# Patient Record
Sex: Female | Born: 1975 | Race: White | Hispanic: No | Marital: Single | Smoking: Never smoker
Health system: Southern US, Community
[De-identification: ages and names within clinical notes are randomized; demographics above are authoritative.]

## PROBLEM LIST (undated history)

## (undated) DIAGNOSIS — B019 Varicella without complication: Secondary | ICD-10-CM

## (undated) DIAGNOSIS — F411 Generalized anxiety disorder: Secondary | ICD-10-CM

## (undated) DIAGNOSIS — E119 Type 2 diabetes mellitus without complications: Secondary | ICD-10-CM

## (undated) DIAGNOSIS — G43909 Migraine, unspecified, not intractable, without status migrainosus: Secondary | ICD-10-CM

## (undated) DIAGNOSIS — J45909 Unspecified asthma, uncomplicated: Secondary | ICD-10-CM

## (undated) HISTORY — DX: Generalized anxiety disorder: F41.1

## (undated) HISTORY — DX: Type 2 diabetes mellitus without complications: E11.9

## (undated) HISTORY — DX: Migraine, unspecified, not intractable, without status migrainosus: G43.909

## (undated) HISTORY — DX: Unspecified asthma, uncomplicated: J45.909

## (undated) HISTORY — DX: Varicella without complication: B01.9

## (undated) HISTORY — PX: WRIST SURGERY: SHX841

---

## 2006-12-01 HISTORY — PX: CHOLECYSTECTOMY: SHX55

## 2010-12-01 HISTORY — PX: LIPOMA RESECTION: SHX23

## 2019-12-15 DIAGNOSIS — F3342 Major depressive disorder, recurrent, in full remission: Secondary | ICD-10-CM | POA: Diagnosis not present

## 2019-12-15 DIAGNOSIS — F411 Generalized anxiety disorder: Secondary | ICD-10-CM | POA: Diagnosis not present

## 2019-12-15 DIAGNOSIS — R197 Diarrhea, unspecified: Secondary | ICD-10-CM | POA: Diagnosis not present

## 2019-12-15 DIAGNOSIS — J45998 Other asthma: Secondary | ICD-10-CM | POA: Diagnosis not present

## 2019-12-15 DIAGNOSIS — E119 Type 2 diabetes mellitus without complications: Secondary | ICD-10-CM | POA: Diagnosis not present

## 2019-12-21 DIAGNOSIS — Z87442 Personal history of urinary calculi: Secondary | ICD-10-CM | POA: Diagnosis not present

## 2019-12-21 DIAGNOSIS — Z79899 Other long term (current) drug therapy: Secondary | ICD-10-CM | POA: Diagnosis not present

## 2019-12-21 DIAGNOSIS — K5900 Constipation, unspecified: Secondary | ICD-10-CM | POA: Diagnosis not present

## 2019-12-21 DIAGNOSIS — Z7984 Long term (current) use of oral hypoglycemic drugs: Secondary | ICD-10-CM | POA: Diagnosis not present

## 2019-12-21 DIAGNOSIS — K649 Unspecified hemorrhoids: Secondary | ICD-10-CM | POA: Diagnosis not present

## 2019-12-21 DIAGNOSIS — K644 Residual hemorrhoidal skin tags: Secondary | ICD-10-CM | POA: Diagnosis not present

## 2019-12-21 DIAGNOSIS — F419 Anxiety disorder, unspecified: Secondary | ICD-10-CM | POA: Diagnosis not present

## 2019-12-21 DIAGNOSIS — R42 Dizziness and giddiness: Secondary | ICD-10-CM | POA: Diagnosis not present

## 2019-12-21 DIAGNOSIS — J45909 Unspecified asthma, uncomplicated: Secondary | ICD-10-CM | POA: Diagnosis not present

## 2019-12-21 DIAGNOSIS — Z9049 Acquired absence of other specified parts of digestive tract: Secondary | ICD-10-CM | POA: Diagnosis not present

## 2019-12-21 DIAGNOSIS — E119 Type 2 diabetes mellitus without complications: Secondary | ICD-10-CM | POA: Diagnosis not present

## 2019-12-29 DIAGNOSIS — Z6841 Body Mass Index (BMI) 40.0 and over, adult: Secondary | ICD-10-CM | POA: Diagnosis not present

## 2019-12-29 DIAGNOSIS — F410 Panic disorder [episodic paroxysmal anxiety] without agoraphobia: Secondary | ICD-10-CM | POA: Diagnosis not present

## 2019-12-29 DIAGNOSIS — K5901 Slow transit constipation: Secondary | ICD-10-CM | POA: Diagnosis not present

## 2019-12-29 DIAGNOSIS — F5104 Psychophysiologic insomnia: Secondary | ICD-10-CM | POA: Diagnosis not present

## 2020-01-28 DIAGNOSIS — R102 Pelvic and perineal pain: Secondary | ICD-10-CM | POA: Diagnosis not present

## 2020-01-28 DIAGNOSIS — Z79899 Other long term (current) drug therapy: Secondary | ICD-10-CM | POA: Diagnosis not present

## 2020-01-28 DIAGNOSIS — Z7982 Long term (current) use of aspirin: Secondary | ICD-10-CM | POA: Diagnosis not present

## 2020-01-28 DIAGNOSIS — Z7951 Long term (current) use of inhaled steroids: Secondary | ICD-10-CM | POA: Diagnosis not present

## 2020-01-28 DIAGNOSIS — Z888 Allergy status to other drugs, medicaments and biological substances status: Secondary | ICD-10-CM | POA: Diagnosis not present

## 2020-01-28 DIAGNOSIS — J45909 Unspecified asthma, uncomplicated: Secondary | ICD-10-CM | POA: Diagnosis not present

## 2020-01-28 DIAGNOSIS — Z87442 Personal history of urinary calculi: Secondary | ICD-10-CM | POA: Diagnosis not present

## 2020-01-28 DIAGNOSIS — Z7984 Long term (current) use of oral hypoglycemic drugs: Secondary | ICD-10-CM | POA: Diagnosis not present

## 2020-01-28 DIAGNOSIS — F419 Anxiety disorder, unspecified: Secondary | ICD-10-CM | POA: Diagnosis not present

## 2020-01-28 DIAGNOSIS — E119 Type 2 diabetes mellitus without complications: Secondary | ICD-10-CM | POA: Diagnosis not present

## 2020-02-01 DIAGNOSIS — N9089 Other specified noninflammatory disorders of vulva and perineum: Secondary | ICD-10-CM | POA: Diagnosis not present

## 2020-02-01 DIAGNOSIS — R110 Nausea: Secondary | ICD-10-CM | POA: Diagnosis not present

## 2020-02-01 DIAGNOSIS — N764 Abscess of vulva: Secondary | ICD-10-CM | POA: Diagnosis not present

## 2020-02-01 DIAGNOSIS — N94819 Vulvodynia, unspecified: Secondary | ICD-10-CM | POA: Diagnosis not present

## 2020-02-01 DIAGNOSIS — J45909 Unspecified asthma, uncomplicated: Secondary | ICD-10-CM | POA: Diagnosis not present

## 2020-02-01 DIAGNOSIS — Z79899 Other long term (current) drug therapy: Secondary | ICD-10-CM | POA: Diagnosis not present

## 2020-02-01 DIAGNOSIS — E119 Type 2 diabetes mellitus without complications: Secondary | ICD-10-CM | POA: Diagnosis not present

## 2020-02-01 DIAGNOSIS — F419 Anxiety disorder, unspecified: Secondary | ICD-10-CM | POA: Diagnosis not present

## 2020-02-01 DIAGNOSIS — R102 Pelvic and perineal pain: Secondary | ICD-10-CM | POA: Diagnosis not present

## 2020-02-03 DIAGNOSIS — N760 Acute vaginitis: Secondary | ICD-10-CM | POA: Diagnosis not present

## 2020-02-03 DIAGNOSIS — N9089 Other specified noninflammatory disorders of vulva and perineum: Secondary | ICD-10-CM | POA: Diagnosis not present

## 2020-02-03 DIAGNOSIS — N907 Vulvar cyst: Secondary | ICD-10-CM | POA: Diagnosis not present

## 2020-02-03 DIAGNOSIS — B9789 Other viral agents as the cause of diseases classified elsewhere: Secondary | ICD-10-CM | POA: Diagnosis not present

## 2020-03-08 LAB — HM DIABETES EYE EXAM

## 2020-03-30 DIAGNOSIS — F3342 Major depressive disorder, recurrent, in full remission: Secondary | ICD-10-CM | POA: Diagnosis not present

## 2020-03-30 DIAGNOSIS — F410 Panic disorder [episodic paroxysmal anxiety] without agoraphobia: Secondary | ICD-10-CM | POA: Diagnosis not present

## 2020-03-30 DIAGNOSIS — F411 Generalized anxiety disorder: Secondary | ICD-10-CM | POA: Diagnosis not present

## 2020-03-30 DIAGNOSIS — E119 Type 2 diabetes mellitus without complications: Secondary | ICD-10-CM | POA: Diagnosis not present

## 2020-05-17 DIAGNOSIS — F411 Generalized anxiety disorder: Secondary | ICD-10-CM | POA: Diagnosis not present

## 2020-07-20 ENCOUNTER — Telehealth: Payer: Self-pay | Admitting: Physician Assistant

## 2020-07-20 DIAGNOSIS — M545 Low back pain, unspecified: Secondary | ICD-10-CM

## 2020-07-20 NOTE — Progress Notes (Signed)
Hi Kaiyana,   I am sorry you are not feeling well.  Unfortunately, this is too complicated of a problem to be taken care of via an E-visit. You need a physical exam, lab work and maybe imaging.  I would feel more comfortable if you were seen face-to-face by a medical provider.   Based on what you shared with me, I feel your condition warrants further evaluation and I recommend that you be seen for a face to face office visit.   NOTE: If you entered your credit card information for this eVisit, you will not be charged. You may see a "hold" on your card for the $35 but that hold will drop off and you will not have a charge processed.   If you are having a true medical emergency please call 911.      For an urgent face to face visit, [facility] has five urgent care centers for your convenience:      NEW:  [facility] [place] [facility] [place] Get Driving Directions [phone] [address] [place], [place] [address] . 10 am - 6pm Monday - Friday    [facility]) Get Driving Directions [phone] [address] [place], [place] [address] . 10 am to 8 pm Monday-Friday . 12 pm to 8 pm [facility] Get Driving Directions [phone] [place], [place], [place] [address] . 8 am to 8 pm Monday-Friday . 9 am to 6 pm Saturday . 11 am to 6 pm Sunday     [facility] at [facility] Get Driving Directions  [phone] [address] [place], [place] [address] . 8 am to 8 pm Monday-Friday . 8 am to 4 pm [facility] [place] Get Driving Directions [phone] [address] [place], [place] [address] . 12 pm to 6 pm Monday-Friday      Your e-visit answers were reviewed by a board certified advanced clinical practitioner to complete your personal care plan.  Thank you for using  e-Visits.

## 2020-07-24 DIAGNOSIS — F411 Generalized anxiety disorder: Secondary | ICD-10-CM | POA: Diagnosis not present

## 2020-07-24 DIAGNOSIS — N200 Calculus of kidney: Secondary | ICD-10-CM | POA: Diagnosis not present

## 2020-07-24 DIAGNOSIS — Z131 Encounter for screening for diabetes mellitus: Secondary | ICD-10-CM | POA: Diagnosis not present

## 2020-07-24 DIAGNOSIS — E119 Type 2 diabetes mellitus without complications: Secondary | ICD-10-CM | POA: Diagnosis not present

## 2020-07-24 DIAGNOSIS — Z6841 Body Mass Index (BMI) 40.0 and over, adult: Secondary | ICD-10-CM | POA: Diagnosis not present

## 2020-10-01 ENCOUNTER — Ambulatory Visit (INDEPENDENT_AMBULATORY_CARE_PROVIDER_SITE_OTHER): Payer: BC Managed Care – PPO | Admitting: Primary Care

## 2020-10-01 ENCOUNTER — Other Ambulatory Visit: Payer: Self-pay

## 2020-10-01 ENCOUNTER — Encounter: Payer: Self-pay | Admitting: Primary Care

## 2020-10-01 VITALS — BP 136/82 | HR 87 | Temp 98.6°F | Ht 65.5 in | Wt 264.0 lb

## 2020-10-01 DIAGNOSIS — E119 Type 2 diabetes mellitus without complications: Secondary | ICD-10-CM

## 2020-10-01 DIAGNOSIS — Z1231 Encounter for screening mammogram for malignant neoplasm of breast: Secondary | ICD-10-CM

## 2020-10-01 DIAGNOSIS — F411 Generalized anxiety disorder: Secondary | ICD-10-CM | POA: Insufficient documentation

## 2020-10-01 DIAGNOSIS — G43709 Chronic migraine without aura, not intractable, without status migrainosus: Secondary | ICD-10-CM | POA: Diagnosis not present

## 2020-10-01 DIAGNOSIS — E1165 Type 2 diabetes mellitus with hyperglycemia: Secondary | ICD-10-CM | POA: Insufficient documentation

## 2020-10-01 DIAGNOSIS — Z23 Encounter for immunization: Secondary | ICD-10-CM | POA: Diagnosis not present

## 2020-10-01 DIAGNOSIS — J4520 Mild intermittent asthma, uncomplicated: Secondary | ICD-10-CM

## 2020-10-01 DIAGNOSIS — M5441 Lumbago with sciatica, right side: Secondary | ICD-10-CM

## 2020-10-01 DIAGNOSIS — J45909 Unspecified asthma, uncomplicated: Secondary | ICD-10-CM | POA: Insufficient documentation

## 2020-10-01 DIAGNOSIS — G43909 Migraine, unspecified, not intractable, without status migrainosus: Secondary | ICD-10-CM | POA: Insufficient documentation

## 2020-10-01 LAB — POCT GLYCOSYLATED HEMOGLOBIN (HGB A1C): Hemoglobin A1C: 5.6 % (ref 4.0–5.6)

## 2020-10-01 LAB — COMPREHENSIVE METABOLIC PANEL
ALT: 14 U/L (ref 0–35)
AST: 16 U/L (ref 0–37)
Albumin: 4.2 g/dL (ref 3.5–5.2)
Alkaline Phosphatase: 51 U/L (ref 39–117)
BUN: 11 mg/dL (ref 6–23)
CO2: 27 mEq/L (ref 19–32)
Calcium: 9 mg/dL (ref 8.4–10.5)
Chloride: 103 mEq/L (ref 96–112)
Creatinine, Ser: 0.74 mg/dL (ref 0.40–1.20)
GFR: 98.44 mL/min (ref >60.00)
Glucose, Bld: 120 mg/dL — ABNORMAL HIGH (ref 70–99)
Potassium: 4.5 mEq/L (ref 3.5–5.1)
Sodium: 137 mEq/L (ref 135–145)
Total Bilirubin: 0.4 mg/dL (ref 0.2–1.2)
Total Protein: 7 g/dL (ref 6.0–8.3)

## 2020-10-01 LAB — CBC
HCT: 42.3 % (ref 36.0–46.0)
Hemoglobin: 14 g/dL (ref 12.0–15.0)
MCHC: 33.2 g/dL (ref 30.0–36.0)
MCV: 83.9 fl (ref 78.0–100.0)
Platelets: 251 10*3/uL (ref 150.0–400.0)
RBC: 5.04 Mil/uL (ref 3.87–5.11)
RDW: 15 % (ref 11.5–15.5)
WBC: 10.1 10*3/uL (ref 4.0–10.5)

## 2020-10-01 LAB — LIPID PANEL
Cholesterol: 152 mg/dL (ref 0–200)
HDL: 38 mg/dL — ABNORMAL LOW (ref >39.00)
LDL Cholesterol: 81 mg/dL (ref 0–99)
NonHDL: 114
Total CHOL/HDL Ratio: 4
Triglycerides: 165 mg/dL — ABNORMAL HIGH (ref 0.0–149.0)
VLDL: 33 mg/dL (ref 0.0–40.0)

## 2020-10-01 MED ORDER — PREDNISONE 20 MG PO TABS: ORAL_TABLET | ORAL | 0 refills | Status: DC

## 2020-10-01 NOTE — Assessment & Plan Note (Signed)
Compliant to Metformin 1000 mg daily. A1C today of 5.6 which is a significant improvement from 6.1 in April 21.  Commended her on this improvement, encouraged to continue with weight loss.  Continue Metformin 1000 mg daily for now as she's trying to become pregnant. Consider dose reduction to 500 mg if she continues with weight loss.   Follow up in 6 months.

## 2020-10-01 NOTE — Assessment & Plan Note (Signed)
Using Advair every other day, infrequent use of Albuterol. Continue current regimen as she's doing well. No wheezing on exam.

## 2020-10-01 NOTE — Assessment & Plan Note (Signed)
Chronic and cyclical with menses. Did well on birth control but had to come off as she is trying for pregnancy.   Continue Fiorinal PRN.

## 2020-10-01 NOTE — Assessment & Plan Note (Signed)
Acute symptoms with sciatic nerve involvement. Discussed stretching, heat/ice. Rx for prednisone course provided.

## 2020-10-01 NOTE — Assessment & Plan Note (Signed)
Chronic for years, doing well on Zoloft 50 mg, infrequent use of lorazepam. Continue to monitor.

## 2020-10-01 NOTE — Patient Instructions (Addendum)
Stop by the lab prior to leaving today. I will notify you of your results once received.   Start prednisone. Take 2 tablets by mouth once daily for three days, then 1 tablet once daily for three days.  Continue to work on a healthy diet and regular exercise!  Please schedule a follow up appointment in 6 months for diabetes check.  It was a pleasure to meet you today! Please don't hesitate to call or message me with any questions. Welcome to [facility]!      Influenza (Flu) Vaccine (Inactivated or Recombinant): What You Need to Know 1. Why get vaccinated? Influenza vaccine can prevent influenza (flu). Flu is a contagious disease that spreads around the [place] every year, usually between October and May. Anyone can get the flu, but it is more dangerous for some people. Infants and young children, people 74 years of age and older, pregnant women, and people with certain health conditions or a weakened immune system are at greatest risk of flu complications. Pneumonia, bronchitis, sinus infections and ear infections are examples of flu-related complications. If you have a medical condition, such as heart disease, cancer or diabetes, flu can make it worse. Flu can cause fever and chills, sore throat, muscle aches, fatigue, cough, headache, and runny or stuffy nose. Some people may have vomiting and diarrhea, though this is more common in children than adults. Each year thousands of people in the [place] die from flu, and many more are hospitalized. Flu vaccine prevents millions of illnesses and flu-related visits to the doctor each year. 2. Influenza vaccine CDC recommends everyone 92 months of age and older get vaccinated every flu season. Children 6 months through 55 years of age may need 2 doses during a single flu season. Everyone else needs only 1 dose each flu season. It takes about 2 weeks for protection to develop after vaccination. There are many flu viruses, and they are always  changing. Each year a new flu vaccine is made to protect against three or four viruses that are likely to cause disease in the upcoming flu season. Even when the vaccine doesn't exactly match these viruses, it may still provide some protection. Influenza vaccine does not cause flu. Influenza vaccine may be given at the same time as other vaccines. 3. Talk with your health care provider Tell your vaccine provider if the person getting the vaccine:  Has had an allergic reaction after a previous dose of influenza vaccine, or has any severe, life-threatening allergies.  Has ever had Guillain-Barr Syndrome (also called GBS). In some cases, your health care provider may decide to postpone influenza vaccination to a future visit. People with minor illnesses, such as a cold, may be vaccinated. People who are moderately or severely ill should usually wait until they recover before getting influenza vaccine. Your health care provider can give you more information. 4. Risks of a vaccine reaction  Soreness, redness, and swelling where shot is given, fever, muscle aches, and headache can happen after influenza vaccine.  There may be a very small increased risk of Guillain-Barr Syndrome (GBS) after inactivated influenza vaccine (the flu shot). Young children who get the flu shot along with pneumococcal vaccine (PCV13), and/or DTaP vaccine at the same time might be slightly more likely to have a seizure caused by fever. Tell your health care provider if a child who is getting flu vaccine has ever had a seizure. People sometimes faint after medical procedures, including vaccination. Tell your provider if you feel  dizzy or have vision changes or ringing in the ears. As with any medicine, there is a very remote chance of a vaccine causing a severe allergic reaction, other serious injury, or death. 5. What if there is a serious problem? An allergic reaction could occur after the vaccinated person leaves the  clinic. If you see signs of a severe allergic reaction (hives, swelling of the face and throat, difficulty breathing, a fast heartbeat, dizziness, or weakness), call 9-1-1 and get the person to the nearest hospital. For other signs that concern you, call your health care provider. Adverse reactions should be reported to the Vaccine Adverse Event Reporting System (VAERS). Your health care provider will usually file this report, or you can do it yourself. Visit the VAERS website at www.vaers.SamedayNews.es or call 409-457-6031.VAERS is only for reporting reactions, and VAERS staff do not give medical advice. 6. The National Vaccine Injury Compensation Program The [employer] Vaccine Injury Compensation Program (VICP) is a federal program that was created to compensate people who may have been injured by certain vaccines. Visit the VICP website at GoldCloset.com.ee or call 806-093-0987 to learn about the program and about filing a claim. There is a time limit to file a claim for compensation. 7. How can I learn more?  Ask your healthcare provider.  Call your local or state health department.  Contact the Centers for Disease Control and Prevention (CDC): ? Call 769-349-8188 (1-800-CDC-INFO) or ? Visit CDC's https://gibson.com/ Vaccine Information Statement (Interim) Inactivated Influenza Vaccine (07/15/2018) This information is not intended to replace advice given to you by your health care provider. Make sure you discuss any questions you have with your health care provider. Document Revised: 03/08/2019 Document Reviewed: 07/19/2018 Elsevier Patient Education  [place].

## 2020-10-01 NOTE — Progress Notes (Signed)
Subjective:    Patient ID: Kelli Cox, female    DOB: 07/26/1976, 44 y.o.   MRN: [id number]  HPI  This visit occurred during the SARS-CoV-2 public health emergency.  Safety protocols were in place, including screening questions prior to the visit, additional usage of staff PPE, and extensive cleaning of exam room while observing appropriate contact time as indicated for disinfecting solutions.   Kelli Cox is a 44 year old female who presents today to establish care and discuss the problems mentioned below. Will obtain/review records.  1) GAD: Diagnosed around 2009 after the passing of her father. Currently managed on sertraline 50 mg. She is not taking trazodone 50 mg as she's able to sleep. She is managed on lorazepam 1 mg for which she uses infrequently, ten tablets will last for about three to six months.    2) Type 2 Diabetes: Diagnosed in 2013. Currently managed on Metformin 1000 mg once daily, was once taking twice daily but prior PCP reduced dose down due to A1C level. Her last A1C was 6.1 in April 2021. She was once managed on lisinopril for renal protection, no longer taking as she is trying to get pregnant. She's lost 80 pounds through diet and exercise.  3) Asthma: Currently managed on Advair 100-50 for which she uses every other day. She uses her albuterol inhaler infrequently during seasonal changes.  Azelastine-fluticasone nasal spray as needed. Overall she feels that her asthma is well managed.   4) Migraines: Chronic and monthly. Previously managed on OCP's and Depo Provera at one point which did well to managed cyclical migraines. Currently managed on butalbital-aspirin-caffeine tablets for which she uses just prior to menses due to cyclical migraines. She is trying to get pregnant so she had to come off of birth control.   Mostly her migraines are located behind her eyes with nausea and photophobia.   5) Chronic Back Pain: Chronic since 2018, but overall no significant  breakthrough symptoms. 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 Overall infrequent pain as she lost 80 pounds.   Recent symptoms began two days ago with right lower back and buttock pain with pain radiating down to her right knee with numbness/tinlging. She denies injury/trauma, loss bowel/bladder control.   BP Readings from Last 3 Encounters:  10/01/20 136/82     Review of Systems  Respiratory: Negative for shortness of breath.   Cardiovascular: Negative for chest pain.  Genitourinary:       Denies loss of bladder/bowel control  Musculoskeletal: Positive for back pain.  Neurological: Positive for numbness. Negative for weakness.       Monthly migraines  Psychiatric/Behavioral: The patient is not nervous/anxious.        Past Medical History:  Diagnosis Date   GAD (generalized anxiety disorder)    Migraines    Type 2 diabetes mellitus (HCC)      Social History   Socioeconomic History   Marital status: Not on file    Spouse name: Not on file   Number of children: Not on file   Years of education: Not on file   Highest education level: Not on file  Occupational History   Not on file  Tobacco Use   Smoking status: Never Smoker   Smokeless tobacco: Never Used  Substance and Sexual Activity   Alcohol use: Not on file    Comment: 1 drink every two weeks    Drug use: Never   Sexual activity: Yes  Other Topics Concern   Not on file  Social History Narrative   Not on file   Social Determinants of Health   Financial Resource Strain:    Difficulty of Paying Living Expenses: Not on file  Food Insecurity:    Worried About [place] in the Last Year: Not on file   Ran Out of Food in the Last Year: Not on file  Transportation Needs:    Lack of Transportation (Medical): Not on file   Lack of Transportation (Non-Medical): Not on file  Physical Activity:    Days of Exercise per Week: Not on file    Minutes of Exercise per Session: Not on file  Stress:    Feeling of Stress : Not on file  Social Connections:    Frequency of Communication with Friends and Family: Not on file   Frequency of Social Gatherings with Friends and Family: Not on file   Attends Religious Services: Not on file   Active Member of Clubs or Organizations: Not on file   Attends Archivist Meetings: Not on file   Marital Status: Not on file  Intimate Partner Violence:    Fear of Current or Ex-Partner: Not on file   Emotionally Abused: Not on file   Physically Abused: Not on file   Sexually Abused: Not on file    Past Surgical History:  Procedure Laterality Date   LIPOMA RESECTION  2012   WRIST SURGERY Left    2001 and 2007    History reviewed. No pertinent family history.  Allergies  Allergen Reactions   Clarithromycin Nausea And Vomiting    Current Outpatient Medications on File Prior to Visit  Medication Sig Dispense Refill   albuterol (PROVENTIL) (2.5 MG/3ML) 0.083% nebulizer solution Inhale into the lungs.     Ascorbic Acid (VITAMIN C) 500 MG CAPS Take by mouth.     Azelastine-Fluticasone 137-50 MCG/ACT SUSP Place into the nose.     butalbital-aspirin-caffeine (FIORINAL) 50-325-40 MG capsule Take by mouth.     cetirizine (ZYRTEC) 10 MG tablet Take by mouth.     Fluticasone-Salmeterol (ADVAIR) 100-50 MCG/DOSE AEPB Inhale into the lungs.     LORazepam (ATIVAN) 1 MG tablet Take 1 mg by mouth 2 (two) times daily.     naproxen (NAPROSYN) 500 MG tablet      sertraline (ZOLOFT) 50 MG tablet Take 50 mg by mouth daily.     vitamin E 180 MG (400 UNITS) capsule Take 400 Units by mouth daily.     No current facility-administered medications on file prior to visit.    BP 136/82    Pulse 87    Temp 98.6 F (37 C) (Temporal)    Ht 5' 5.5" (1.664 m)    Wt 264 lb (119.7 kg)    SpO2 98%    BMI 43.26 kg/m    Objective:   Physical Exam Cardiovascular:     Rate and Rhythm:  Normal rate and regular rhythm.  Pulmonary:     Effort: Pulmonary effort is normal.     Breath sounds: Normal breath sounds.  Musculoskeletal:     Cervical back: Neck supple.     Comments: 5/5 strength to bilateral lower extremities.  Positive right straight leg raise  Skin:    General: Skin is warm and dry.            Assessment & Plan:

## 2020-10-09 DIAGNOSIS — M5441 Lumbago with sciatica, right side: Secondary | ICD-10-CM

## 2020-10-09 MED ORDER — PREDNISONE 20 MG PO TABS: ORAL_TABLET | ORAL | 0 refills | Status: DC

## 2020-10-10 ENCOUNTER — Telehealth: Payer: BC Managed Care – PPO | Admitting: Family

## 2020-10-10 DIAGNOSIS — J069 Acute upper respiratory infection, unspecified: Secondary | ICD-10-CM

## 2020-10-10 MED ORDER — BENZONATATE 100 MG PO CAPS: 100.0000 mg | ORAL_CAPSULE | Freq: Three times a day (TID) | ORAL | 0 refills | Status: DC | PRN

## 2020-10-10 MED ORDER — CETIRIZINE HCL 10 MG PO TABS: 10.0000 mg | ORAL_TABLET | Freq: Every day | ORAL | 11 refills | Status: AC

## 2020-10-10 MED ORDER — FLUTICASONE PROPIONATE 50 MCG/ACT NA SUSP: 2.0000 | Freq: Every day | NASAL | 6 refills | Status: DC

## 2020-10-10 NOTE — Progress Notes (Signed)
We are sorry you are not feeling well.  Here is how we plan to help!  Based on what you have shared with me, it looks like you may have a viral upper respiratory infection.  Upper respiratory infections are caused by a large number of viruses; however, rhinovirus is the most common cause.   Symptoms vary from person to person, with common symptoms including sore throat, cough, fatigue or lack of energy and feeling of general discomfort.  A low-grade fever of up to 100.4 may present, but is often uncommon.  Symptoms vary however, and are closely related to a person's age or underlying illnesses.  The most common symptoms associated with an upper respiratory infection are nasal discharge or congestion, cough, sneezing, headache and pressure in the ears and face.  These symptoms usually persist for about 3 to 10 days, but can last up to 2 weeks.  It is important to know that upper respiratory infections do not cause serious illness or complications in most cases.    Upper respiratory infections can be transmitted from person to person, with the most common method of transmission being a person's hands.  The virus is able to live on the skin and can infect other persons for up to 2 hours after direct contact.  Also, these can be transmitted when someone coughs or sneezes; thus, it is important to cover the mouth to reduce this risk.  To keep the spread of the illness at [place], good hand hygiene is very important.  This is an infection that is most likely caused by a virus. There are no specific treatments other than to help you with the symptoms until the infection runs its course.  We are sorry you are not feeling well.  Here is how we plan to help!   For nasal congestion, you may use an oral decongestants such as Mucinex D or if you have glaucoma or high blood pressure use plain Mucinex.  Saline nasal spray or nasal drops can help and can safely be used as often as needed for congestion.  For your congestion,  I have prescribed Fluticasone nasal spray one spray in each nostril twice a day and zyrtec 10 mg.   If you do not have a history of heart disease, hypertension, diabetes or thyroid disease, prostate/bladder issues or glaucoma, you may also use Sudafed to treat nasal congestion.  It is highly recommended that you consult with a pharmacist or your primary care physician to ensure this medication is safe for you to take.     If you have a cough, you may use cough suppressants such as Delsym and Robitussin.  If you have glaucoma or high blood pressure, you can also use Coricidin HBP.   For cough I have prescribed for you A prescription cough medication called Tessalon Perles 100 mg. You may take 1-2 capsules every 8 hours as needed for cough  If you have a sore or scratchy throat, use a saltwater gargle-  to  teaspoon of salt dissolved in a 4-ounce to 8-ounce glass of warm water.  Gargle the solution for approximately 15-30 seconds and then spit.  It is important not to swallow the solution.  You can also use throat lozenges/cough drops and Chloraseptic spray to help with throat pain or discomfort.  Warm or cold liquids can also be helpful in relieving throat pain.  For headache, pain or general discomfort, you can use Ibuprofen or Tylenol as directed.   Some authorities believe that zinc  sprays or the use of Echinacea may shorten the course of your symptoms.   HOME CARE . Only take medications as instructed by your medical team. . Be sure to drink plenty of fluids. Water is fine as well as fruit juices, sodas and electrolyte beverages. You may want to stay away from caffeine or alcohol. If you are nauseated, try taking small sips of liquids. How do you know if you are getting enough fluid? Your urine should be a pale yellow or almost colorless. . Get rest. . Taking a steamy shower or using a humidifier may help nasal congestion and ease sore throat pain. You can place a towel over your head and  breathe in the steam from hot water coming from a faucet. . Using a saline nasal spray works much the same way. . Cough drops, hard candies and sore throat lozenges may ease your cough. . Avoid close contacts especially the very young and the elderly . Cover your mouth if you cough or sneeze . Always remember to wash your hands.   GET HELP RIGHT AWAY IF: . You develop worsening fever. . If your symptoms do not improve within 10 days . You develop yellow or green discharge from your nose over 3 days. . You have coughing fits . You develop a severe head ache or visual changes. . You develop shortness of breath, difficulty breathing or start having chest pain . Your symptoms persist after you have completed your treatment plan  MAKE SURE YOU   Understand these instructions.  Will watch your condition.  Will get help right away if you are not doing well or get worse.  Your e-visit answers were reviewed by a board certified advanced clinical practitioner to complete your personal care plan. Depending upon the condition, your plan could have included both over the counter or prescription medications. Please review your pharmacy choice. If there is a problem, you may call our nursing hot line at and have the prescription routed to another pharmacy. Your safety is important to [place]. If you have drug allergies check your prescription carefully.   You can use MyChart to ask questions about today's visit, request a non-urgent call back, or ask for a work or school excuse for 24 hours related to this e-Visit. If it has been greater than 24 hours you will need to follow up with your provider, or enter a new e-Visit to address those concerns. You will get an e-mail in the next two days asking about your experience.  I hope that your e-visit has been valuable and will speed your recovery. Thank you for using e-visits.    Approximately 5 minutes was spent documenting and reviewing patient's chart.

## 2020-10-17 ENCOUNTER — Telehealth (INDEPENDENT_AMBULATORY_CARE_PROVIDER_SITE_OTHER): Payer: BC Managed Care – PPO | Admitting: Primary Care

## 2020-10-17 ENCOUNTER — Other Ambulatory Visit: Payer: BC Managed Care – PPO

## 2020-10-17 ENCOUNTER — Other Ambulatory Visit: Payer: Self-pay

## 2020-10-17 VITALS — Temp 99.2°F | Ht 65.5 in | Wt 264.0 lb

## 2020-10-17 DIAGNOSIS — J019 Acute sinusitis, unspecified: Secondary | ICD-10-CM | POA: Diagnosis not present

## 2020-10-17 DIAGNOSIS — J4520 Mild intermittent asthma, uncomplicated: Secondary | ICD-10-CM | POA: Diagnosis not present

## 2020-10-17 HISTORY — DX: Acute sinusitis, unspecified: J01.90

## 2020-10-17 MED ORDER — ALBUTEROL SULFATE (2.5 MG/3ML) 0.083% IN NEBU: 2.5000 mg | INHALATION_SOLUTION | Freq: Four times a day (QID) | RESPIRATORY_TRACT | 0 refills | Status: DC | PRN

## 2020-10-17 MED ORDER — AMOXICILLIN-POT CLAVULANATE 875-125 MG PO TABS: 1.0000 | ORAL_TABLET | Freq: Two times a day (BID) | ORAL | 0 refills | Status: DC

## 2020-10-17 NOTE — Assessment & Plan Note (Signed)
  Over 2 weeks of sinusitis and now lower respiratory tract symptoms with low grade fevers, not improving with conservative treatment. She does appear ill but stable.  Given duration of symptoms coupled with presentation, will treat with Augmentin course. Refills provided for neubulizer solution for asthma, she has the nebulizer machine at home.  She will update once her Covid test returns.

## 2020-10-17 NOTE — Progress Notes (Signed)
Subjective:    Patient ID: Kelli Cox, female    DOB: [birth year], 44 y.o.   MRN: [id number]  HPI  Virtual Visit via Video Note  I connected with Kelli Cox on 10/17/20 at  7:20 AM EST by a video enabled telemedicine application and verified that I am speaking with the correct person using two identifiers.  Location: Patient: Home Provider: Office Participants: Patient and myself   I discussed the limitations of evaluation and management by telemedicine and the availability of in person appointments. The patient expressed understanding and agreed to proceed.  History of Present Illness:  Kelli Cox is a 44 year old female with a history of asthma, type 2 diabetes who presents today with a chief complaint of sinus pressure.   She was evaluated by telemedicine on 10/10/20 for symptoms of sore throat, cough, post nasal drip that began four days prior.  She was diagnosed with a viral URI and prescribed Zyrtec, Flonase, and Tessalon Perles. She's also been through two rounds of oral prednisone for sciatica.   Today she's feeling worse. Symptoms now include wheezing, increased cough with expulsion of green mucus, low grade fevers, fatigue. She has a history of sinusitis, typically occurs during seasonal changes, this feels similar. She denies known exposure to Covid-19, has received two Covid-19 vaccinations. She does plan on getting Covid tested today, but really doesn't believe she has Covid-19. Denies loss of taste/smell, diarrhea.    Observations/Objective:  Alert and oriented. Appears ill. No distress. Speaking in complete sentences. Dry, deep, congested cough during visit.  Assessment and Plan:  Over 2 weeks of sinusitis and now lower respiratory tract symptoms with low grade fevers, not improving with conservative treatment. She does appear ill but stable.  Given duration of symptoms coupled with presentation, will treat with Augmentin course. Refills provided for neubulizer  solution for asthma, she has the nebulizer machine at home.  She will update once her Covid test returns.   Follow Up Instructions:  Start Augmentin antibiotics for the infection Take 1 tablet by mouth twice daily for 10 days.  Use the nebulizer every 6 hours as needed for wheezing/shortness of breath.  Please update me once you receive your Covid-19 test.  Kelli Bossier, NP-C    I discussed the assessment and treatment plan with the patient. The patient was provided an opportunity to ask questions and all were answered. The patient agreed with the plan and demonstrated an understanding of the instructions.   The patient was advised to call back or seek an in-person evaluation if the symptoms worsen or if the condition fails to improve as anticipated.    Kelli Koch, NP    Review of Systems  Constitutional: Positive for fatigue and fever.  HENT: Positive for congestion, postnasal drip and sinus pressure.   Respiratory: Positive for shortness of breath and wheezing.   Allergic/Immunologic: Positive for environmental allergies.       Past Medical History:  Diagnosis Date   Asthma    Chicken pox    GAD (generalized anxiety disorder)    Migraines    Type 2 diabetes mellitus (HCC)      Social History   Socioeconomic History   Marital status: Single    Spouse name: Not on file   Number of children: Not on file   Years of education: Not on file   Highest education level: Master's degree (e.g., MA, MS, MEng, MEd, MSW, MBA)  Occupational History   Occupation: Government social research officer  Tobacco  Use   Smoking status: Never Smoker   Smokeless tobacco: Never Used  Substance and Sexual Activity   Alcohol use: Not on file    Comment: 1 drink every two weeks    Drug use: Never   Sexual activity: Yes    Partners: Male  Other Topics Concern   Not on file  Social History Narrative   Not on file   Social Determinants of Health   Financial Resource Strain:      Difficulty of Paying Living Expenses: Not on file  Food Insecurity:    Worried About Kelli Cox in the Last Year: Not on file   Ran Out of Food in the Last Year: Not on file  Transportation Needs:    Lack of Transportation (Medical): Not on file   Lack of Transportation (Non-Medical): Not on file  Physical Activity:    Days of Exercise per Week: Not on file   Minutes of Exercise per Session: Not on file  Stress:    Feeling of Stress : Not on file  Social Connections:    Frequency of Communication with Friends and Family: Not on file   Frequency of Social Gatherings with Friends and Family: Not on file   Attends Religious Services: Not on file   Active Member of Clubs or Organizations: Not on file   Attends Archivist Meetings: Not on file   Marital Status: Not on file  Intimate Partner Violence:    Fear of Current or Ex-Partner: Not on file   Emotionally Abused: Not on file   Physically Abused: Not on file   Sexually Abused: Not on file    Past Surgical History:  Procedure Laterality Date   CHOLECYSTECTOMY  2008   [place]  2012   WRIST SURGERY Left    2001 and 2007    Family History  Problem Relation Age of Onset   Arthritis Mother    Asthma Mother    Depression Mother    Hyperlipidemia Mother    Hypertension Mother    Kidney disease Mother    Cancer Father    Diabetes Father    Early death Father    Hyperlipidemia Father    Hypertension Father    Heart failure Father    Asthma Sister    Hypertension Sister    Miscarriages / Stillbirths Sister    Alcohol abuse Brother    Hypertension Brother    Osteoarthritis Maternal Grandmother    Asthma Maternal Grandmother    Cancer Maternal Grandmother    COPD Maternal Grandmother    Depression Maternal Grandmother    Early death Maternal Grandmother    Osteoarthritis Maternal Grandfather    Asthma Maternal Grandfather    COPD Maternal  Grandfather    Hypertension Maternal Grandfather    Hyperlipidemia Maternal Grandfather    Cancer Paternal Grandmother    Diabetes Paternal Grandmother    Diabetes Paternal Grandfather    Hyperlipidemia Paternal Grandfather    Heart failure Paternal Grandfather    Kidney disease Paternal Grandfather    Hyperlipidemia Other    Heart failure Other    Kidney disease Other     Allergies  Allergen Reactions   Clarithromycin Nausea And Vomiting    Current Outpatient Medications on File Prior to Visit  Medication Sig Dispense Refill   Ascorbic Acid (VITAMIN C) 500 MG CAPS Take by mouth.     Azelastine-Fluticasone 137-50 MCG/ACT SUSP Place into the nose.     benzonatate (TESSALON PERLES)  100 MG capsule Take 1 capsule (100 mg total) by mouth 3 (three) times daily as needed. 20 capsule 0   butalbital-aspirin-caffeine (FIORINAL) 50-325-40 MG capsule Take by mouth.     cetirizine (ZYRTEC) 10 MG tablet Take 1 tablet (10 mg total) by mouth daily. 30 tablet 11   fluticasone (FLONASE) 50 MCG/ACT nasal spray Place 2 sprays into both nostrils daily. 16 g 6   Fluticasone-Salmeterol (ADVAIR) 100-50 MCG/DOSE AEPB Inhale into the lungs.     LORazepam (ATIVAN) 1 MG tablet Take 1 mg by mouth 2 (two) times daily.     sertraline (ZOLOFT) 50 MG tablet Take 50 mg by mouth daily.     vitamin E 180 MG (400 UNITS) capsule Take 400 Units by mouth daily.     naproxen (NAPROSYN) 500 MG tablet      predniSONE (DELTASONE) 20 MG tablet Take 2 tablets by mouth once daily for four days, then 1 tablet once daily for four days. 12 tablet 0   No current facility-administered medications on file prior to visit.    Temp 99.2 F (37.3 C) (Oral)    Ht 5' 5.5" (1.664 m)    Wt 264 lb (119.7 kg)    BMI 43.26 kg/m    Objective:   Physical Exam Constitutional:      General: She is not in acute distress.    Appearance: She is ill-appearing.  Pulmonary:     Effort: Pulmonary effort is normal.      Comments: Dry, deep, congested cough during exam Neurological:     Mental Status: She is alert and oriented to person, place, and time.  Psychiatric:        Mood and Affect: Mood normal.            Assessment & Plan:

## 2020-10-17 NOTE — Patient Instructions (Signed)
Start Augmentin antibiotics for the infection Take 1 tablet by mouth twice daily for 10 days.  Use the nebulizer every 6 hours as needed for wheezing/shortness of breath.  Please update me once you receive your Covid-19 test.  Allie Bossier, NP-C

## 2020-10-17 NOTE — Assessment & Plan Note (Signed)
Refills provided for nebulizer solution to use PRN.

## 2020-10-29 ENCOUNTER — Encounter: Payer: Self-pay | Admitting: Primary Care

## 2020-10-29 ENCOUNTER — Other Ambulatory Visit: Payer: Self-pay

## 2020-10-29 DIAGNOSIS — J019 Acute sinusitis, unspecified: Secondary | ICD-10-CM

## 2020-10-29 NOTE — Telephone Encounter (Signed)
Please Advise

## 2020-10-30 NOTE — Telephone Encounter (Signed)
Please get update on patient first.  Routed to PCP as FYI.  Thanks.

## 2020-11-08 ENCOUNTER — Other Ambulatory Visit: Payer: Self-pay | Admitting: Primary Care

## 2020-11-08 DIAGNOSIS — Z1231 Encounter for screening mammogram for malignant neoplasm of breast: Secondary | ICD-10-CM

## 2020-11-09 ENCOUNTER — Ambulatory Visit
Admission: RE | Admit: 2020-11-09 | Discharge: 2020-11-09 | Disposition: A | Discharge status: Home / Self Care | Payer: BC Managed Care – PPO | Source: Ambulatory Visit | Attending: Family Medicine | Admitting: Family Medicine

## 2020-11-09 ENCOUNTER — Other Ambulatory Visit: Payer: Self-pay

## 2020-11-09 ENCOUNTER — Ambulatory Visit (INDEPENDENT_AMBULATORY_CARE_PROVIDER_SITE_OTHER): Payer: BC Managed Care – PPO

## 2020-11-09 VITALS — BP 125/93 | HR 98 | Temp 98.4°F | Resp 20 | Ht 65.0 in | Wt 260.0 lb

## 2020-11-09 DIAGNOSIS — M5441 Lumbago with sciatica, right side: Secondary | ICD-10-CM

## 2020-11-09 DIAGNOSIS — J9809 Other diseases of bronchus, not elsewhere classified: Secondary | ICD-10-CM | POA: Diagnosis not present

## 2020-11-09 DIAGNOSIS — R059 Cough, unspecified: Secondary | ICD-10-CM | POA: Diagnosis not present

## 2020-11-09 DIAGNOSIS — R062 Wheezing: Secondary | ICD-10-CM

## 2020-11-09 DIAGNOSIS — J209 Acute bronchitis, unspecified: Secondary | ICD-10-CM | POA: Diagnosis not present

## 2020-11-09 IMAGING — DX DG CHEST 2V
2 series · 2 of 2 positions shown · non-contrast
Comparison: None.

CLINICAL DATA: Cough and wheezing over the last month.

EXAM:
CHEST - 2 VIEW

[chest pa]
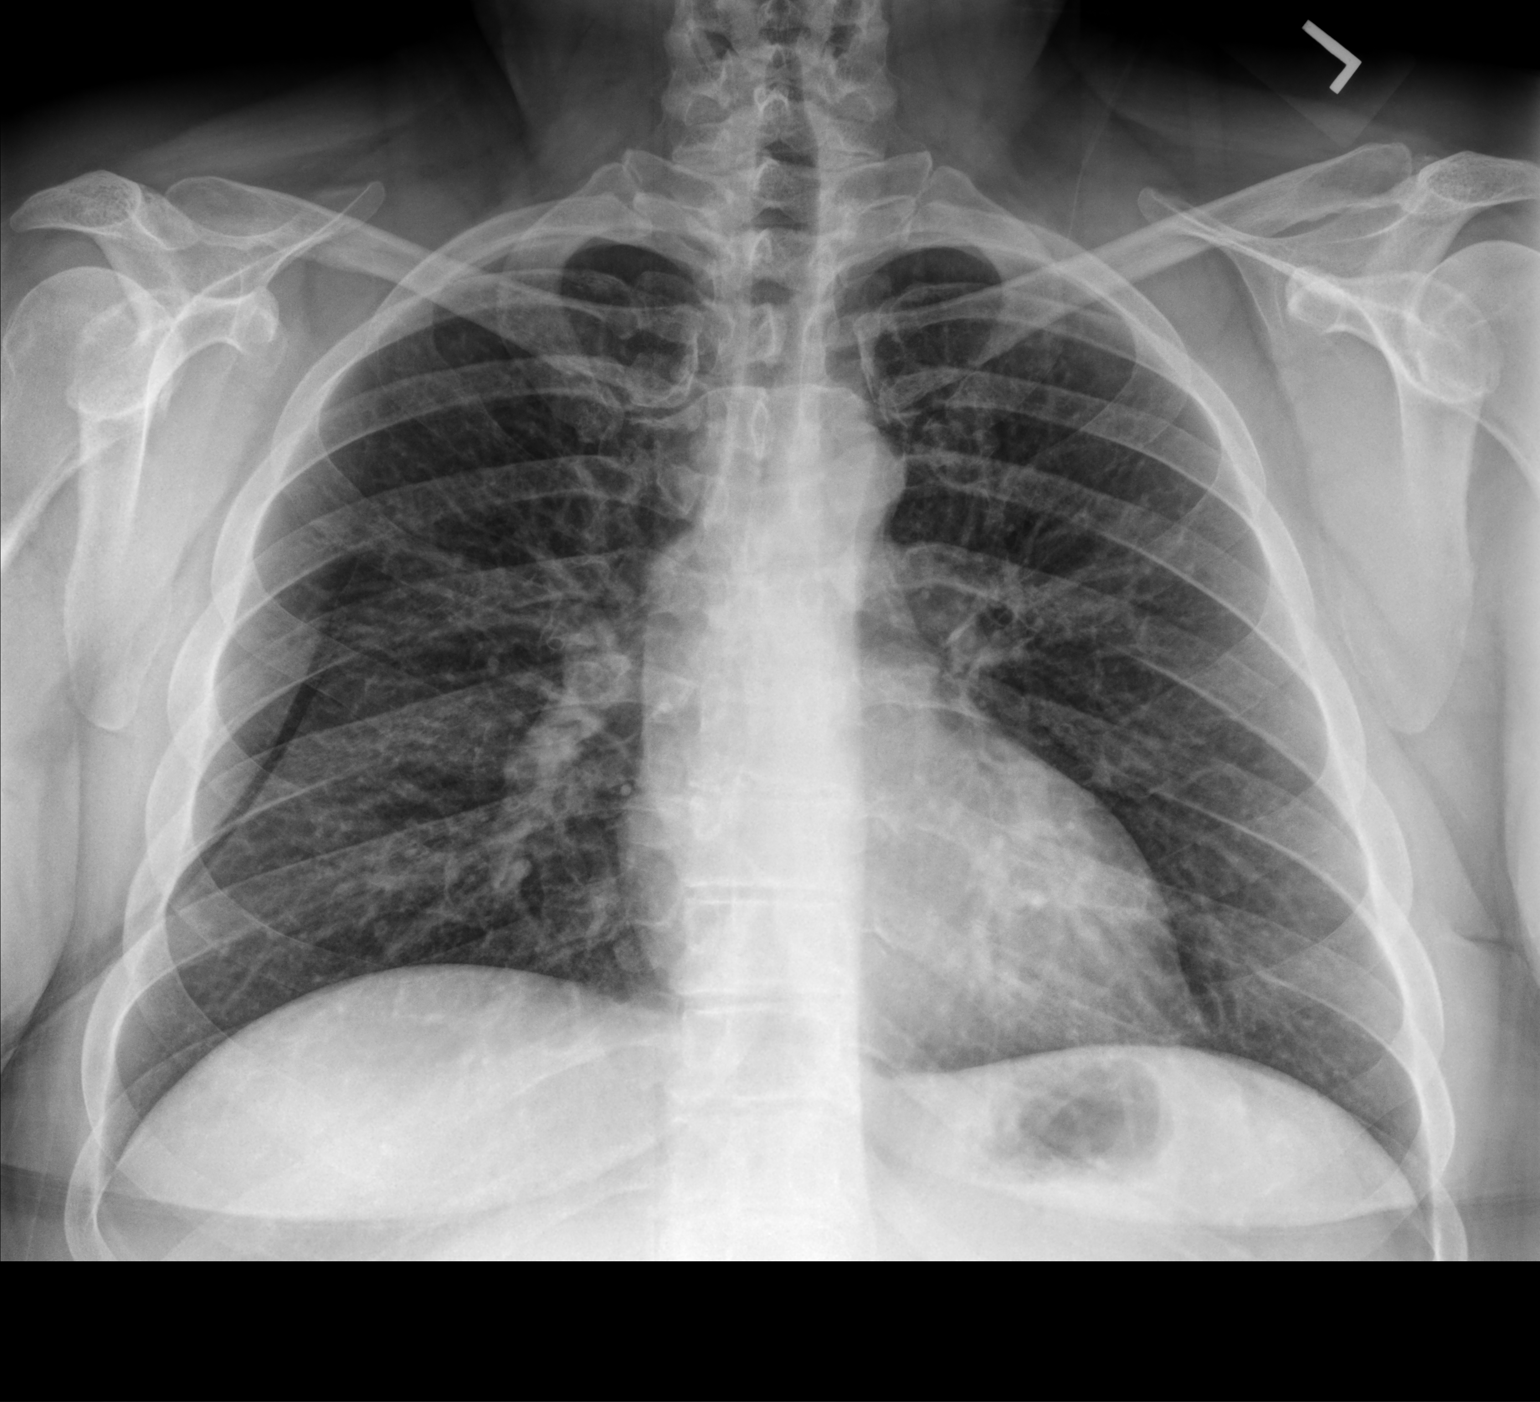

[chest lat]
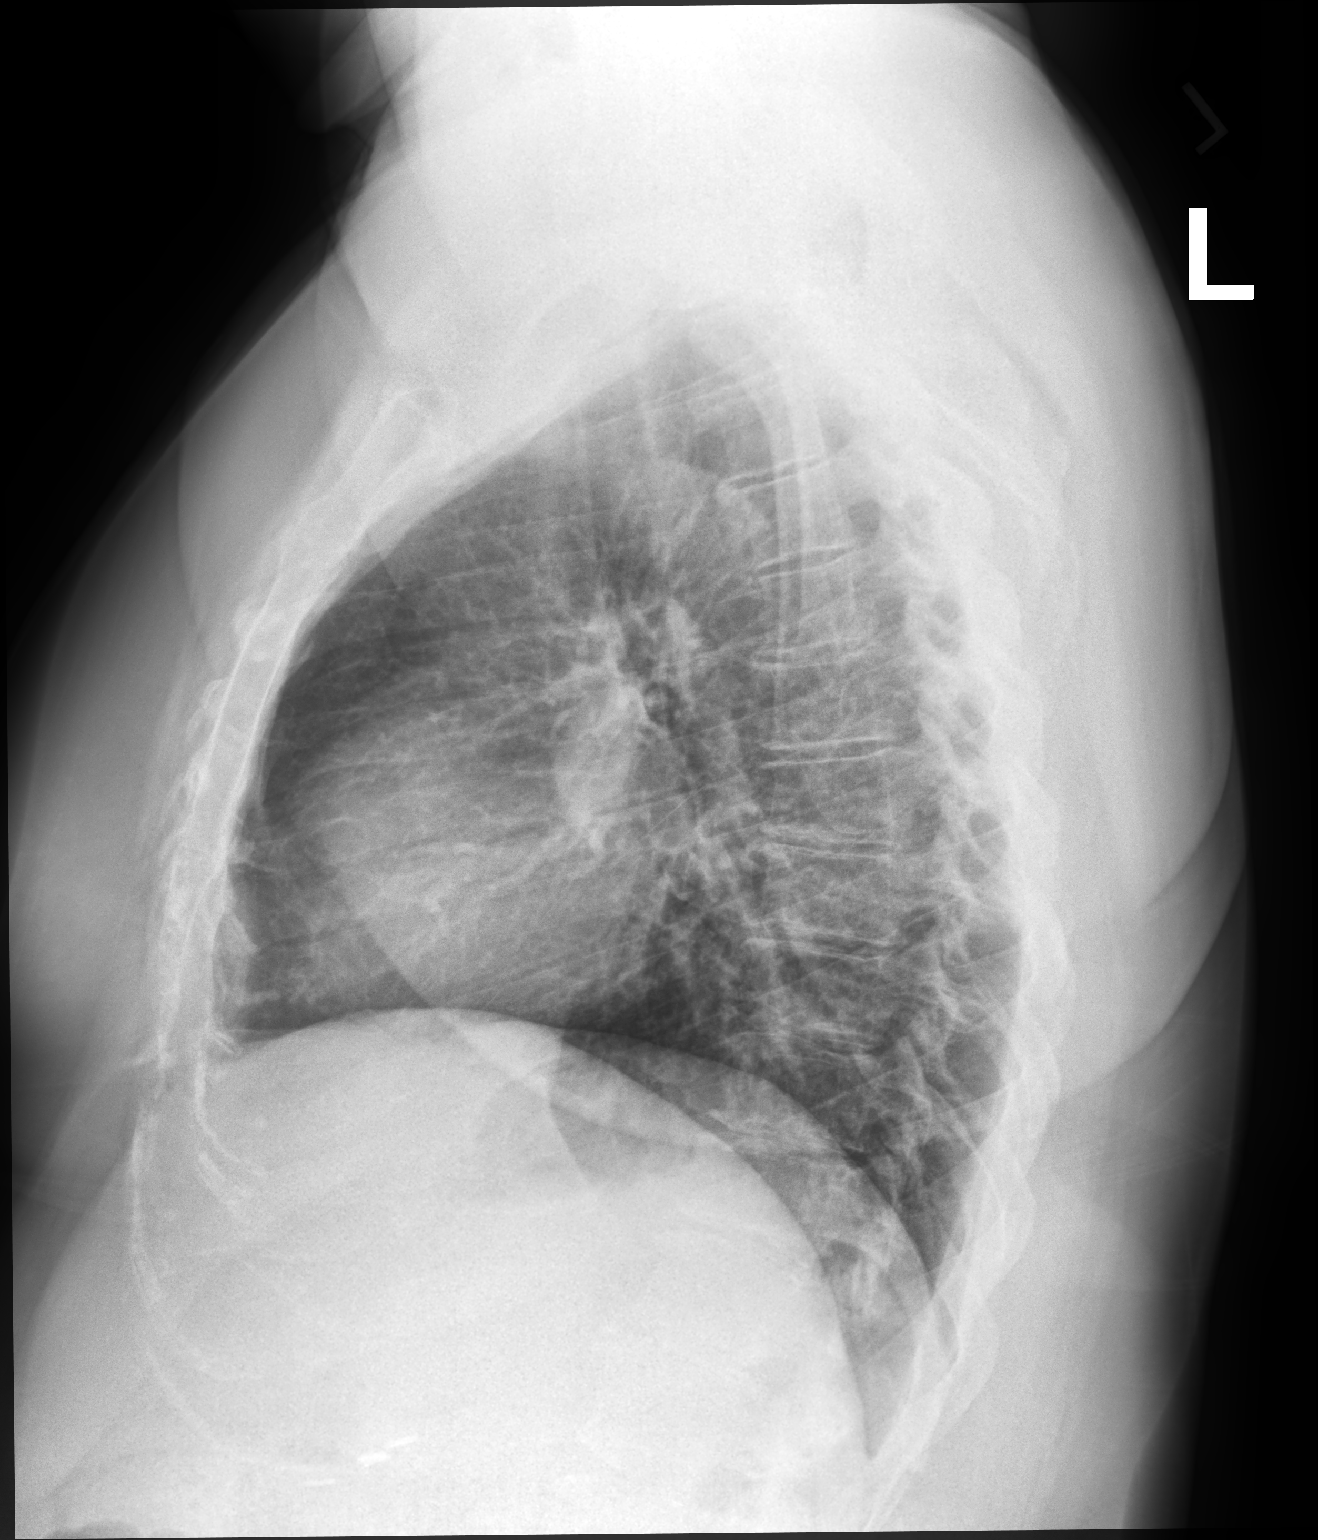

[2 of 2 positions shown; findings below may reference images not displayed]

FINDINGS: Heart and mediastinal shadows are normal. There is central bronchial
thickening consistent with bronchitis. No infiltrate, collapse or
effusion. No significant bone finding.
IMPRESSION: Bronchitis pattern. No consolidation or collapse.

## 2020-11-09 MED ORDER — BENZONATATE 100 MG PO CAPS: 200.0000 mg | ORAL_CAPSULE | Freq: Three times a day (TID) | ORAL | 0 refills | Status: DC

## 2020-11-09 MED ORDER — ALBUTEROL SULFATE HFA 108 (90 BASE) MCG/ACT IN AERS
1.0000 | INHALATION_SPRAY | Freq: Four times a day (QID) | RESPIRATORY_TRACT | 0 refills | Status: DC | PRN
Start: 2020-11-09 — End: 2021-02-22

## 2020-11-09 MED ORDER — PREDNISONE 10 MG PO TABS: 40.0000 mg | ORAL_TABLET | Freq: Every day | ORAL | 0 refills | Status: AC

## 2020-11-09 NOTE — ED Triage Notes (Signed)
Pt reports having a productive cough x1 month. sts the cough has gotten worse. Neg covid test 2 weeks ago.

## 2020-11-09 NOTE — Discharge Instructions (Addendum)
Treating you for bronchitis.  Medication as prescribed Follow up as needed for continued or worsening symptoms

## 2020-11-09 NOTE — ED Provider Notes (Signed)
[name]    CSN: [id number] Arrival date & time: 11/09/20  1154      History   Chief Complaint Chief Complaint  Patient presents with  . Appointment  . Cough    HPI Kelli Cox is a 44 y.o. female.   Patient is a 44 year old female past medical history of asthma.  She presents today for chronic cough and for the past month.  Was concerned due to the cough being more productive.  Has been taking over-the-counter cough medicine without any relief.  Takes Zyrtec daily.  Had 2 - Covid test 2 weeks ago.  She has had some mild shortness of breath.  Has been treated for sinusitis in the past month with Augmentin.  Did not see any improvement with this.  Has been taking Zyrtec, Flonase daily.  No fever, chills, body aches or night sweats.     Past Medical History:  Diagnosis Date  . Asthma   . Chicken pox   . GAD (generalized anxiety disorder)   . Migraines   . Type 2 diabetes mellitus [facility])     Patient Active Problem List   Diagnosis Date Noted  . Acute non-recurrent sinusitis 10/17/2020  . Diabetes mellitus without complication ([place]) 72/62/0355  . Migraines 10/01/2020  . Acute right-sided low back pain with right-sided sciatica 10/01/2020  . Asthma 10/01/2020  . GAD (generalized anxiety disorder) 10/01/2020    Past Surgical History:  Procedure Laterality Date  . CHOLECYSTECTOMY  2008  . LIPOMA RESECTION  2012  . WRIST SURGERY Left    2001 and 2007    OB History   No obstetric history on file.      Home Medications    Prior to Admission medications   Medication Sig Start Date End Date Taking? Authorizing Provider  albuterol (PROVENTIL) (2.5 MG/3ML) 0.083% nebulizer solution Take 3 mLs (2.5 mg total) by nebulization every 6 (six) hours as needed for wheezing or shortness of breath. 10/17/20   Pleas Koch, NP  albuterol (VENTOLIN HFA) 108 (90 Base) MCG/ACT inhaler Inhale 1-2 puffs into the lungs every 6 (six) hours as needed for wheezing or  shortness of breath. 11/09/20   Loura Halt A, NP  Ascorbic Acid (VITAMIN C) 500 MG CAPS Take by mouth.    [provider]  Azelastine-Fluticasone 137-50 MCG/ACT SUSP Place into the nose. 05/02/19   [provider]  benzonatate (TESSALON) 100 MG capsule Take 2 capsules (200 mg total) by mouth every 8 (eight) hours. 11/09/20   Orvan July, NP  butalbital-aspirin-caffeine Acquanetta Chain) 50-325-40 MG capsule Take by mouth. 12/15/18   [provider]  cetirizine (ZYRTEC) 10 MG tablet Take 1 tablet (10 mg total) by mouth daily. 10/10/20   Sharion Balloon, FNP  fluticasone (FLONASE) 50 MCG/ACT nasal spray Place 2 sprays into both nostrils daily. 10/10/20   Sharion Balloon, FNP  Fluticasone-Salmeterol (ADVAIR) 100-50 MCG/DOSE AEPB Inhale into the lungs. 06/21/20   [provider]  LORazepam (ATIVAN) 1 MG tablet Take 1 mg by mouth 2 (two) times daily. 08/13/20   [provider]  naproxen (NAPROSYN) 500 MG tablet     [provider]  predniSONE (DELTASONE) 10 MG tablet Take 4 tablets (40 mg total) by mouth daily for 5 days. 11/09/20 11/14/20  Loura Halt A, NP  sertraline (ZOLOFT) 50 MG tablet Take 50 mg by mouth daily. 09/11/20   [provider]  vitamin E 180 MG (400 UNITS) capsule Take 400 Units by mouth  daily.    [provider]    Family History Family History  Problem Relation Age of Onset  . Arthritis Mother   . Asthma Mother   . Depression Mother   . Hyperlipidemia Mother   . Hypertension Mother   . Kidney disease Mother   . Cancer Father   . Diabetes Father   . Early death Father   . Hyperlipidemia Father   . Hypertension Father   . Heart failure Father   . Asthma Sister   . Hypertension Sister   . Miscarriages / Stillbirths Sister   . Alcohol abuse Brother   . Hypertension Brother   . Osteoarthritis Maternal Grandmother   . Asthma Maternal Grandmother   . Cancer Maternal Grandmother   . COPD Maternal Grandmother    . Depression Maternal Grandmother   . Early death Maternal Grandmother   . Osteoarthritis Maternal Grandfather   . Asthma Maternal Grandfather   . COPD Maternal Grandfather   . Hypertension Maternal Grandfather   . Hyperlipidemia Maternal Grandfather   . Cancer Paternal Grandmother   . Diabetes Paternal Grandmother   . Diabetes Paternal Grandfather   . Hyperlipidemia Paternal Grandfather   . Heart failure Paternal Grandfather   . Kidney disease Paternal Grandfather   . Hyperlipidemia Other   . Heart failure Other   . Kidney disease Other     Social History Social History   Tobacco Use  . Smoking status: Never Smoker  . Smokeless tobacco: Never Used  Substance Use Topics  . Alcohol use: Yes    Comment: 1 drink every two weeks   . Drug use: Never     Allergies   Clarithromycin   Review of Systems Review of Systems   Physical Exam Triage Vital Signs ED Triage Vitals [11/09/20 1200]  Enc Vitals Group     BP (!) 125/93     Pulse Rate 98     Resp 20     Temp 98.4 F (36.9 C)     Temp Source Oral     SpO2 96 %     Weight 260 lb (117.9 kg)     Height 5\' 5"  (1.651 m)     Head Circumference      Peak Flow      Pain Score 0     Pain Loc      Pain Edu?      Excl. in [place]?    No data found.  Updated Vital Signs BP (!) 125/93   Pulse 98   Temp 98.4 F (36.9 C) (Oral)   Resp 20   Ht 5\' 5"  (1.651 m)   Wt 260 lb (117.9 kg)   LMP 10/29/2020   SpO2 96%   BMI 43.27 kg/m   Visual Acuity Right Eye Distance:   Left Eye Distance:   Bilateral Distance:    Right Eye Near:   Left Eye Near:    Bilateral Near:     Physical Exam Vitals and nursing note reviewed.  Constitutional:      General: She is not in acute distress.    Appearance: Normal appearance. She is not ill-appearing, toxic-appearing or diaphoretic.  HENT:     Head: Normocephalic.     Nose: Nose normal.  Eyes:     Conjunctiva/sclera: Conjunctivae normal.  Cardiovascular:     Rate and  Rhythm: Normal rate and regular rhythm.  Pulmonary:     Effort: Pulmonary effort is normal.     Breath sounds: Wheezing present.  Comments: Expiratory wheezing.  Musculoskeletal:        General: Normal range of motion.     Cervical back: Normal range of motion.  Skin:    General: Skin is warm and dry.     Findings: No rash.  Neurological:     Mental Status: She is alert.  Psychiatric:        Mood and Affect: Mood normal.      UC Treatments / Results  Labs (all labs ordered are listed, but only abnormal results are displayed) Labs Reviewed - No data to display  EKG   Radiology DG Chest 2 View  Result Date: 11/09/2020 CLINICAL DATA:  Cough and wheezing over the last month. EXAM: CHEST - 2 VIEW COMPARISON:  None. FINDINGS: Heart and mediastinal shadows are normal. There is central bronchial thickening consistent with bronchitis. No infiltrate, collapse or effusion. No significant bone finding. IMPRESSION: Bronchitis pattern. No consolidation or collapse. Electronically Signed   By: Nelson Chimes M.D.   On: 11/09/2020 12:33    Procedures Procedures (including critical care time)  Medications Ordered in UC Medications - No data to display  Initial Impression / Assessment and Plan / UC Course  I have reviewed the triage vital signs and the nursing notes.  Pertinent labs & imaging results that were available during my care of the patient were reviewed by me and considered in my medical decision making (see chart for details).     Acute bronchitis X-ray without any concerns Will treat with prednisone burst. Albuterol inhaler as needed for cough, wheezing or shortness of breath.  Tessalon Perles as needed for cough. Follow up as needed for continued or worsening symptoms  Final Clinical Impressions(s) / UC Diagnoses   Final diagnoses:  Acute bronchitis, unspecified organism     Discharge Instructions     Treating you for bronchitis.  Medication as  prescribed Follow up as needed for continued or worsening symptoms     ED Prescriptions    Medication Sig Dispense Auth. Provider   predniSONE (DELTASONE) 10 MG tablet Take 4 tablets (40 mg total) by mouth daily for 5 days. 20 tablet Nhi Butrum A, NP   albuterol (VENTOLIN HFA) 108 (90 Base) MCG/ACT inhaler Inhale 1-2 puffs into the lungs every 6 (six) hours as needed for wheezing or shortness of breath. 1 each Krishna Heuer A, NP   benzonatate (TESSALON) 100 MG capsule Take 2 capsules (200 mg total) by mouth every 8 (eight) hours. 45 capsule Avanelle Pixley A, NP     PDMP not reviewed this encounter.   Orvan July, NP 11/09/20 1248

## 2020-11-12 ENCOUNTER — Telehealth: Payer: BC Managed Care – PPO | Admitting: Primary Care

## 2020-12-04 ENCOUNTER — Other Ambulatory Visit: Payer: Self-pay

## 2020-12-04 ENCOUNTER — Ambulatory Visit
Admission: RE | Admit: 2020-12-04 | Discharge: 2020-12-04 | Disposition: A | Discharge status: Home / Self Care | Payer: BC Managed Care – PPO | Source: Ambulatory Visit

## 2020-12-04 DIAGNOSIS — Z1231 Encounter for screening mammogram for malignant neoplasm of breast: Secondary | ICD-10-CM

## 2020-12-04 IMAGING — MG DIGITAL SCREENING BILAT W/ TOMO W/ CAD
6 of 10 series · 6 of 30 positions shown · non-contrast
Comparison: None.

CLINICAL DATA: Screening.

EXAM:
DIGITAL SCREENING BILATERAL MAMMOGRAM WITH TOMO AND CAD

[R MLO synth-2D]
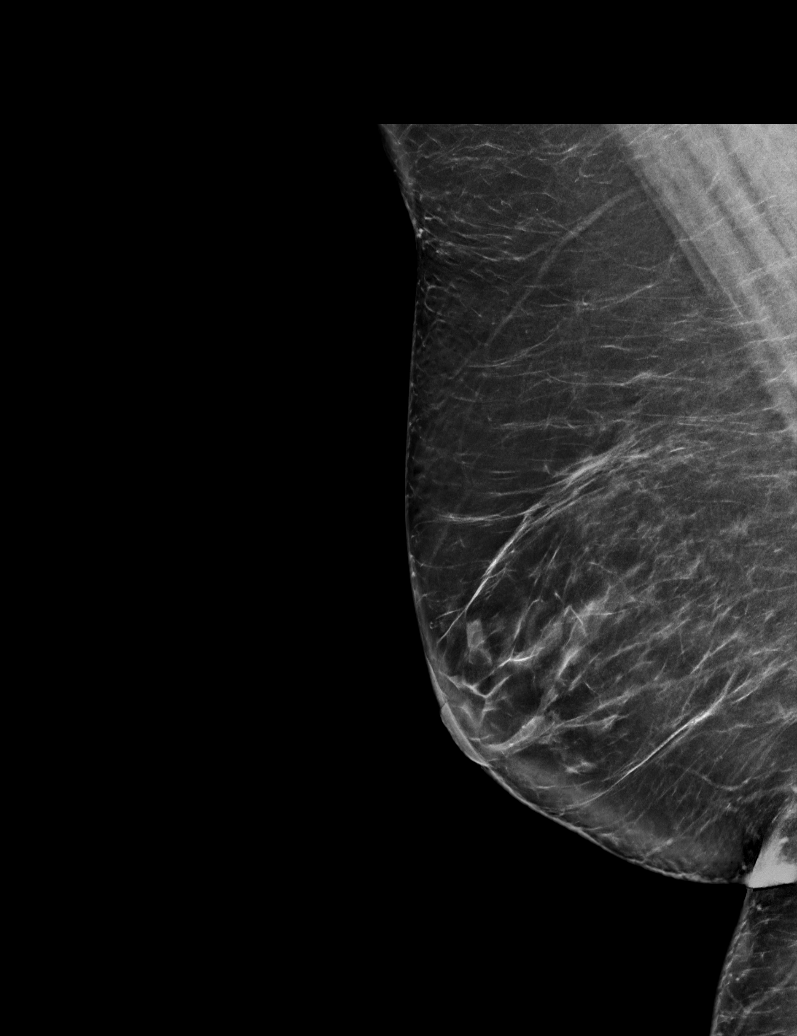

[L XCCM synth-2D]
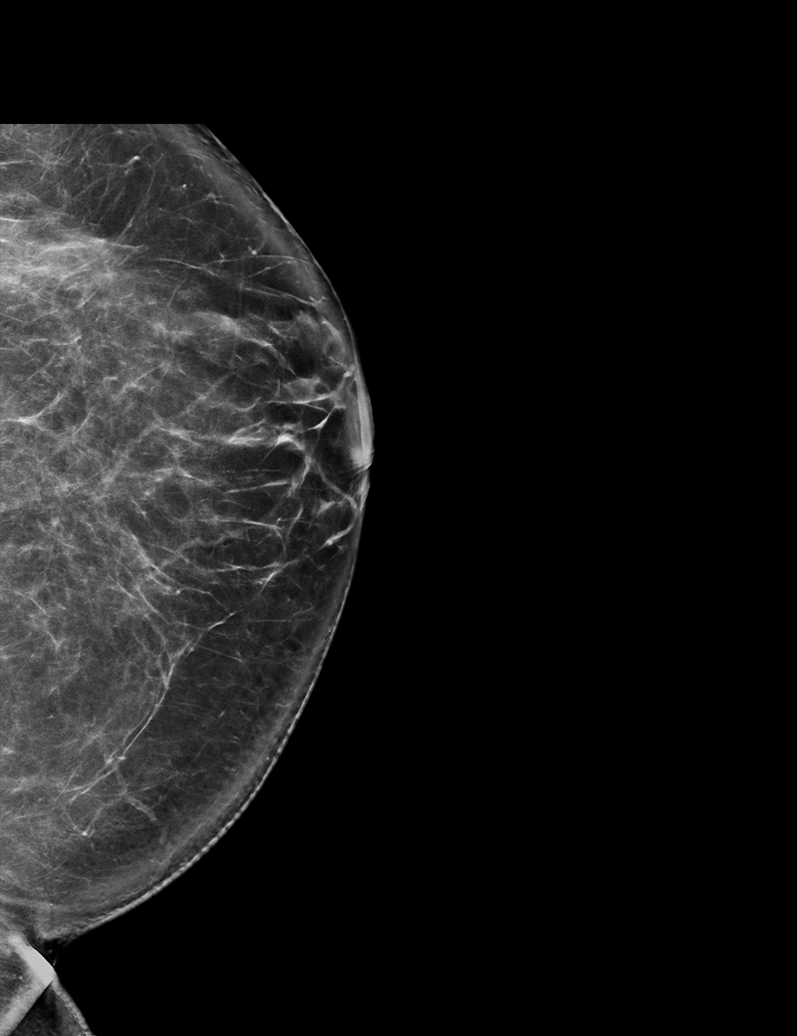

[L CC synth-2D]
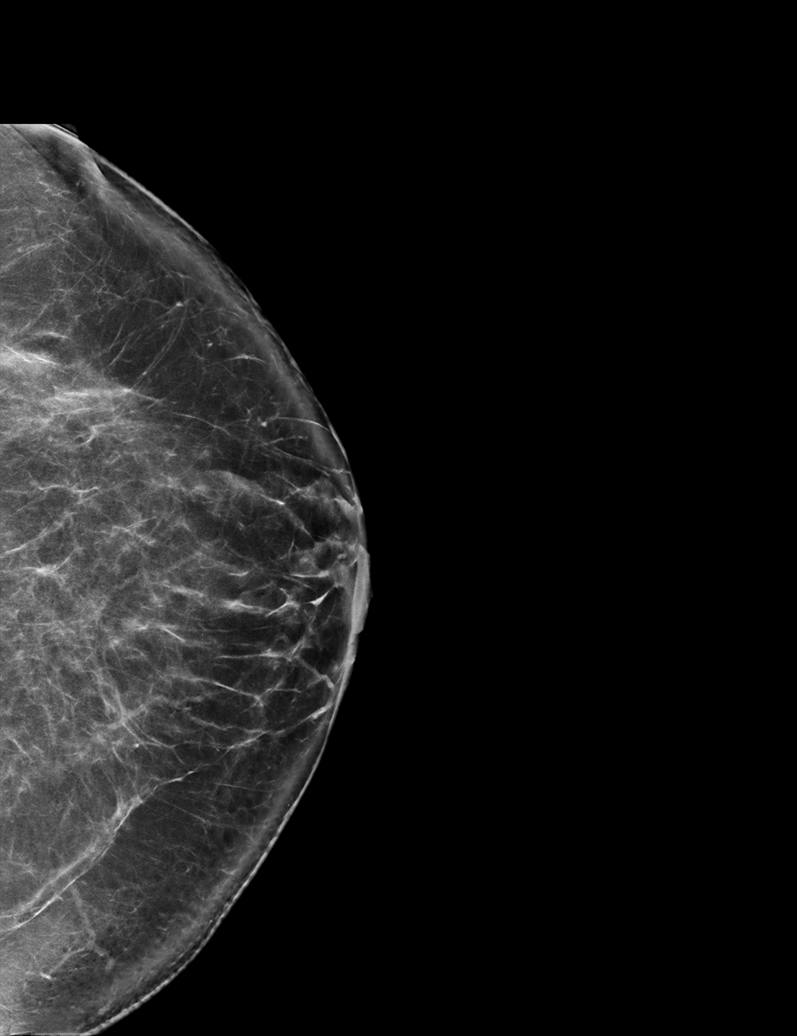

[R CC synth-2D]
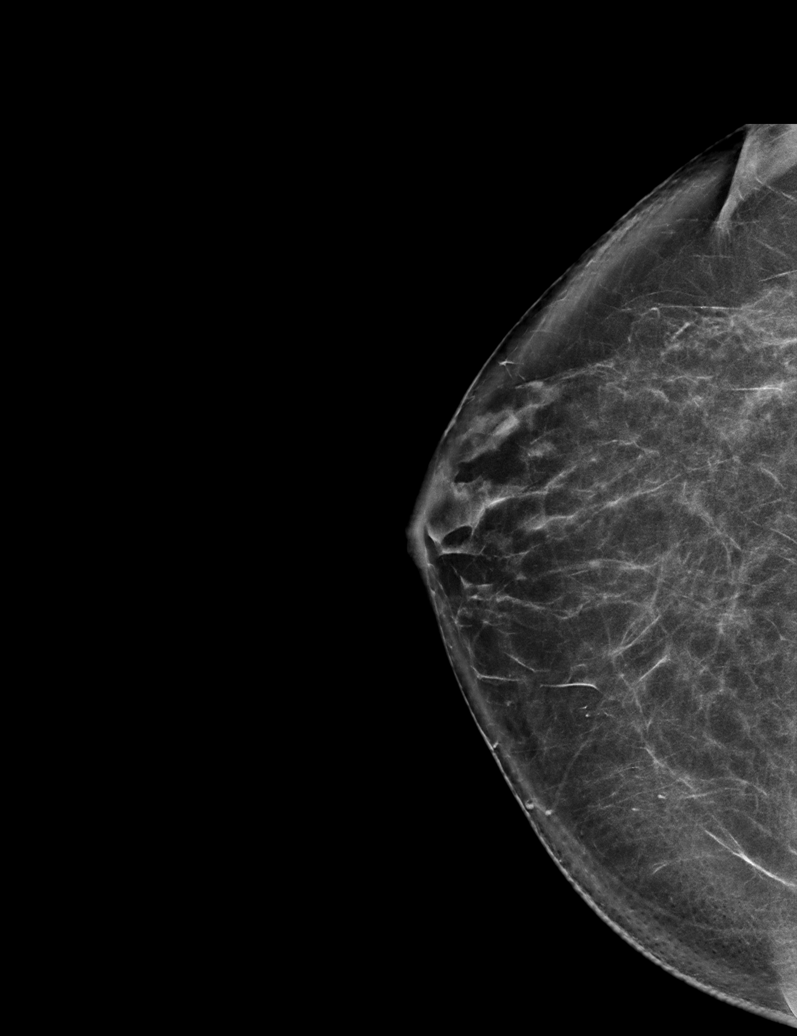

[L MLO synth-2D]
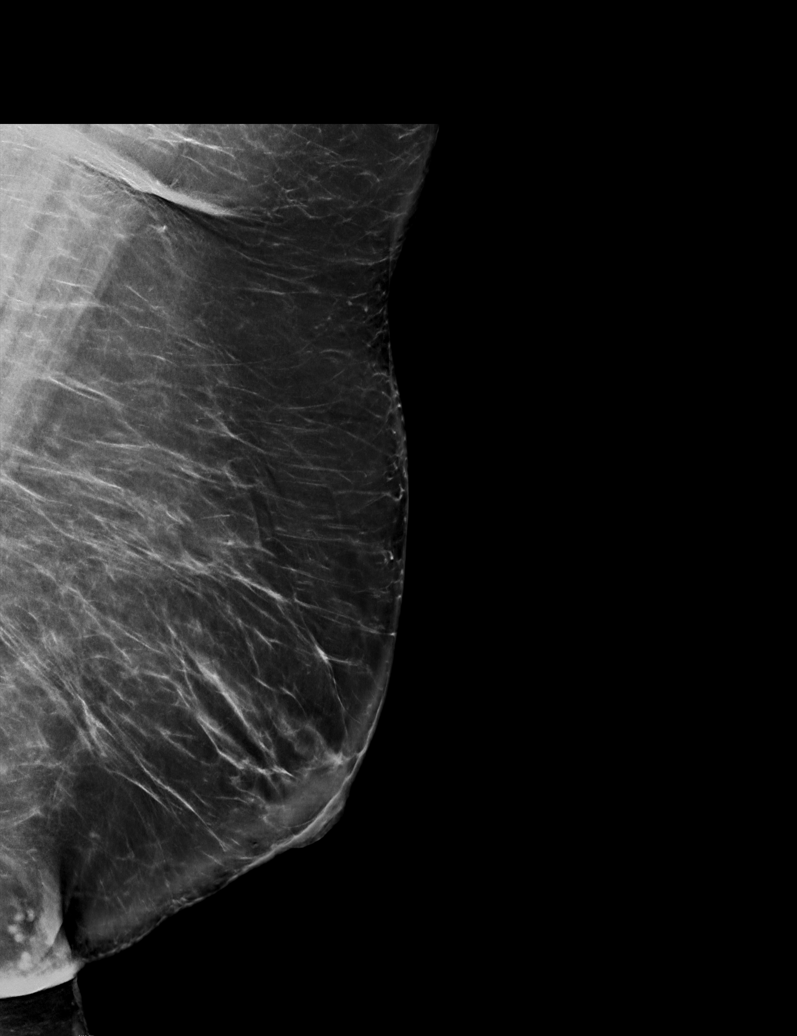

[L MLO tomo · tomo slice 48/95.0]
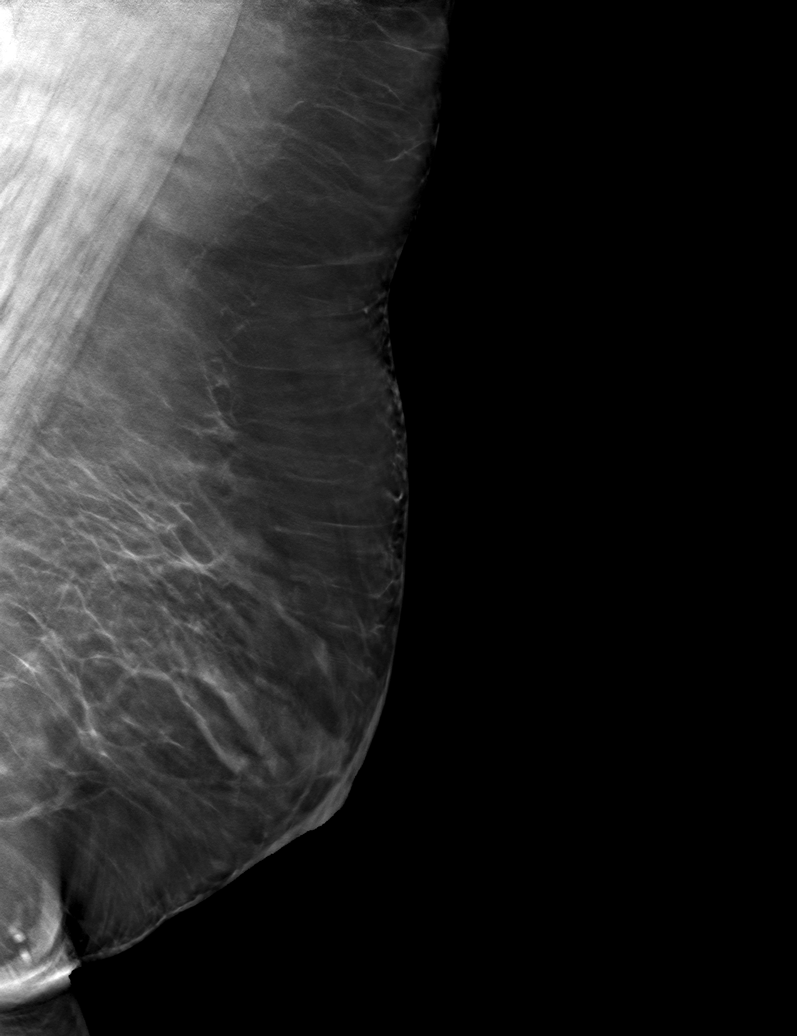

[6 of 30 positions shown; findings below may reference images not displayed]

ACR Breast Density Category b: There are scattered areas of
fibroglandular density.
FINDINGS: There are no findings suspicious for malignancy. Images were
processed with CAD.
IMPRESSION: No mammographic evidence of malignancy. A result letter of this
screening mammogram will be mailed directly to the patient.

RECOMMENDATION:
Screening mammogram in one year. (Code:[1T])

BI-RADS CATEGORY  1: Negative.

## 2020-12-07 ENCOUNTER — Telehealth: Payer: BC Managed Care – PPO | Admitting: Nurse Practitioner

## 2020-12-07 DIAGNOSIS — J4541 Moderate persistent asthma with (acute) exacerbation: Secondary | ICD-10-CM | POA: Diagnosis not present

## 2020-12-07 MED ORDER — PREDNISONE 20 MG PO TABS: ORAL_TABLET | ORAL | 0 refills | Status: DC

## 2020-12-07 MED ORDER — DOXYCYCLINE HYCLATE 100 MG PO TABS: 100.0000 mg | ORAL_TABLET | Freq: Two times a day (BID) | ORAL | 0 refills | Status: DC

## 2020-12-07 NOTE — Progress Notes (Signed)
We are sorry that you are not feeling well.  Here is how we plan to help!  Based on your presentation I believe you most likely have A cough due to bacteria.  When patients have a fever and a productive cough with a change in color or increased sputum production, we are concerned about bacterial bronchitis.  If left untreated it can progress to pneumonia.  If your symptoms do not improve with your treatment plan it is important that you contact your provider.   I have prescribed Doxycycline 100 mg twice a day for 7 days     In addition you may use A non-prescription cough medication called Mucinex DM: take 2 tablets every 12 hours.  Prednisone 20mg  2 tablets daily for 5 days.  From your responses in the eVisit questionnaire you describe inflammation in the upper respiratory tract which is causing a significant cough.  This is commonly called Bronchitis and has four common causes:    Allergies  Viral Infections  Acid Reflux  Bacterial Infection Allergies, viruses and acid reflux are treated by controlling symptoms or eliminating the cause. An example might be a cough caused by taking certain blood pressure medications. You stop the cough by changing the medication. Another example might be a cough caused by acid reflux. Controlling the reflux helps control the cough.  USE OF BRONCHODILATOR ("RESCUE") INHALERS: There is a risk from using your bronchodilator too frequently.  The risk is that over-reliance on a medication which only relaxes the muscles surrounding the breathing tubes can reduce the effectiveness of medications prescribed to reduce swelling and congestion of the tubes themselves.  Although you feel brief relief from the bronchodilator inhaler, your asthma may actually be worsening with the tubes becoming more swollen and filled with mucus.  This can delay other crucial treatments, such as oral steroid medications. If you need to use a bronchodilator inhaler daily, several times per  day, you should discuss this with your provider.  There are probably better treatments that could be used to keep your asthma under control.     HOME CARE . Only take medications as instructed by your medical team. . Complete the entire course of an antibiotic. . Drink plenty of fluids and get plenty of rest. . Avoid close contacts especially the very young and the elderly . Cover your mouth if you cough or cough into your sleeve. . Always remember to wash your hands . A steam or ultrasonic humidifier can help congestion.   GET HELP RIGHT AWAY IF: . You develop worsening fever. . You become short of breath . You cough up blood. . Your symptoms persist after you have completed your treatment plan MAKE SURE YOU   Understand these instructions.  Will watch your condition.  Will get help right away if you are not doing well or get worse.  Your e-visit answers were reviewed by a board certified advanced clinical practitioner to complete your personal care plan.  Depending on the condition, your plan could have included both over the counter or prescription medications. If there is a problem please reply  once you have received a response from your provider. Your safety is important to [place].  If you have drug allergies check your prescription carefully.    You can use MyChart to ask questions about today's visit, request a non-urgent call back, or ask for a work or school excuse for 24 hours related to this e-Visit. If it has been greater than 24 hours you  will need to follow up with your provider, or enter a new e-Visit to address those concerns. You will get an e-mail in the next two days asking about your experience.  I hope that your e-visit has been valuable and will speed your recovery. Thank you for using e-visits. 5-10 minutes spent reviewing and documenting in chart.

## 2020-12-12 NOTE — Telephone Encounter (Signed)
Office visit please. I'll work her in this week, just teams me.

## 2020-12-14 ENCOUNTER — Telehealth (INDEPENDENT_AMBULATORY_CARE_PROVIDER_SITE_OTHER): Payer: BC Managed Care – PPO | Admitting: Primary Care

## 2020-12-14 ENCOUNTER — Other Ambulatory Visit: Payer: Self-pay | Admitting: Primary Care

## 2020-12-14 DIAGNOSIS — F411 Generalized anxiety disorder: Secondary | ICD-10-CM

## 2020-12-14 MED ORDER — HYDROXYZINE HCL 10 MG PO TABS
10.0000 mg | ORAL_TABLET | Freq: Two times a day (BID) | ORAL | 0 refills | Status: DC | PRN
Start: 2020-12-14 — End: 2021-01-02

## 2020-12-14 NOTE — Progress Notes (Signed)
Subjective:    Patient ID: Kelli Cox, female    DOB: 1976/05/17, 45 y.o.   MRN: [id number]  HPI  Virtual Visit via Video Note  I connected with Kelli Cox on 12/14/20 at 10:00 AM EST by a video enabled telemedicine application and verified that I am speaking with the correct person using two identifiers.  Location: Patient: Home Provider: Office Participants: patient and myself   I discussed the limitations of evaluation and management by telemedicine and the availability of in person appointments. The patient expressed understanding and agreed to proceed.  History of Present Illness:  Kelli Cox is a 45 year old female with a history of migraines, asthma, type 2 diabetes, anxiety who presents today with reports of anxiety.   Currently managed on sertraline 50 mg and lorazepam 1 mg PRN. Over the last month she's experienced increased anxiety, is stressed with work, is afraid she will lose her job, has had "bronchitis" twice recently, is still recovering.   She is compliant to her sertraline 50 mg daily, believes this helps. She had a panic attack one week ago and took her last lorazepam tablet with relief. Her last panic attack was about one year ago, her last refill of lorazepam was in September 2021, has taken all 10 tablets of lorazepam for symptoms of panic/anxiety.  She has never tried anything else for acute anxiety in the past.    Observations/Objective:  Alert and oriented. Appears well, not sickly. No distress. Speaking in complete sentences.   Assessment and Plan:  Increased anxiety over the last month, seems situational with her occupation/illness.  She seems to be doing well overall on Zoloft 50 mg, discussed that we can increase the dose if needed in the future. Would rather her not be on benzos, is taking lorazepam twice monthly on average.  She agrees to try hydroxyzine, discussed dose and potential side effects.  Continue Zoloft 50 mg daily. Add  hydroxyzine PRN. She will update.   Follow Up Instructions:  Continue taking sertraline (Zoloft) 50 mg daily for anxiety.  You may take hydroxyzine 10 mg, 1 or 2 tablets up to twice daily if needed. This may cause drowsiness.  Please update me as discussed.  It was a pleasure to see you today! Allie Bossier, NP-C    I discussed the assessment and treatment plan with the patient. The patient was provided an opportunity to ask questions and all were answered. The patient agreed with the plan and demonstrated an understanding of the instructions.   The patient was advised to call back or seek an in-person evaluation if the symptoms worsen or if the condition fails to improve as anticipated.    Pleas Koch, NP    Review of Systems  Respiratory: Negative for shortness of breath.   Cardiovascular: Negative for chest pain.  Psychiatric/Behavioral: The patient is nervous/anxious.        See HPI       Past Medical History:  Diagnosis Date  . Asthma   . Chicken pox   . GAD (generalized anxiety disorder)   . Migraines   . Type 2 diabetes mellitus ([place])      Social History   Socioeconomic History  . Marital status: Single    Spouse name: Not on file  . Number of children: Not on file  . Years of education: Not on file  . Highest education level: Master's degree (e.g., MA, MS, MEng, MEd, MSW, MBA)  Occupational History  . Occupation: Project  Manager  Tobacco Use  . Smoking status: Never Smoker  . Smokeless tobacco: Never Used  Substance and Sexual Activity  . Alcohol use: Yes    Comment: 1 drink every two weeks   . Drug use: Never  . Sexual activity: Yes    Partners: Male  Other Topics Concern  . Not on file  Social History Narrative  . Not on file   Social Determinants of Health   Financial Resource Strain: Not on file  Food Insecurity: Not on file  Transportation Needs: Not on file  Physical Activity: Not on file  Stress: Not on file  Social  Connections: Not on file  Intimate Partner Violence: Not on file    Past Surgical History:  Procedure Laterality Date  . CHOLECYSTECTOMY  2008  . LIPOMA RESECTION  2012  . WRIST SURGERY Left    2001 and 2007    Family History  Problem Relation Age of Onset  . Arthritis Mother   . Asthma Mother   . Depression Mother   . Hyperlipidemia Mother   . Hypertension Mother   . Kidney disease Mother   . Cancer Father   . Diabetes Father   . Early death Father   . Hyperlipidemia Father   . Hypertension Father   . Heart failure Father   . Asthma Sister   . Hypertension Sister   . Miscarriages / Stillbirths Sister   . Alcohol abuse Brother   . Hypertension Brother   . Osteoarthritis Maternal Grandmother   . Asthma Maternal Grandmother   . Cancer Maternal Grandmother   . COPD Maternal Grandmother   . Depression Maternal Grandmother   . Early death Maternal Grandmother   . Osteoarthritis Maternal Grandfather   . Asthma Maternal Grandfather   . COPD Maternal Grandfather   . Hypertension Maternal Grandfather   . Hyperlipidemia Maternal Grandfather   . Cancer Paternal Grandmother   . Diabetes Paternal Grandmother   . Diabetes Paternal Grandfather   . Hyperlipidemia Paternal Grandfather   . Heart failure Paternal Grandfather   . Kidney disease Paternal Grandfather   . Hyperlipidemia Other   . Heart failure Other   . Kidney disease Other     Allergies  Allergen Reactions  . Clarithromycin Nausea And Vomiting    Current Outpatient Medications on File Prior to Visit  Medication Sig Dispense Refill  . albuterol (PROVENTIL) (2.5 MG/3ML) 0.083% nebulizer solution Take 3 mLs (2.5 mg total) by nebulization every 6 (six) hours as needed for wheezing or shortness of breath. 75 mL 0  . albuterol (VENTOLIN HFA) 108 (90 Base) MCG/ACT inhaler Inhale 1-2 puffs into the lungs every 6 (six) hours as needed for wheezing or shortness of breath. 1 each 0  . Ascorbic Acid (VITAMIN C) 500 MG  CAPS Take by mouth.    . Azelastine-Fluticasone 137-50 MCG/ACT SUSP Place into the nose.    . butalbital-aspirin-caffeine (FIORINAL) 50-325-40 MG capsule Take by mouth.    . cetirizine (ZYRTEC) 10 MG tablet Take 1 tablet (10 mg total) by mouth daily. 30 tablet 11  . doxycycline (VIBRA-TABS) 100 MG tablet Take 1 tablet (100 mg total) by mouth 2 (two) times daily. 1 po bid 14 tablet 0  . fluticasone (FLONASE) 50 MCG/ACT nasal spray Place 2 sprays into both nostrils daily. 16 g 6  . Fluticasone-Salmeterol (ADVAIR) 100-50 MCG/DOSE AEPB Inhale into the lungs.    Marland Kitchen LORazepam (ATIVAN) 1 MG tablet Take 1 mg by mouth 2 (two) times daily.    Marland Kitchen  sertraline (ZOLOFT) 50 MG tablet Take 50 mg by mouth daily.    . vitamin E 180 MG (400 UNITS) capsule Take 400 Units by mouth daily.     No current facility-administered medications on file prior to visit.    Temp 98.6 F (37 C) (Temporal)   Ht 5\' 5"  (1.651 m)   Wt 261 lb (118.4 kg)   LMP 10/29/2020   BMI 43.43 kg/m    Objective:   Physical Exam Constitutional:      General: She is not in acute distress. Pulmonary:     Effort: Pulmonary effort is normal.  Neurological:     Mental Status: She is alert and oriented to person, place, and time.  Psychiatric:        Mood and Affect: Mood normal.            Assessment & Plan:

## 2020-12-14 NOTE — Patient Instructions (Signed)
Continue taking sertraline (Zoloft) 50 mg daily for anxiety.  You may take hydroxyzine 10 mg, 1 or 2 tablets up to twice daily if needed. This may cause drowsiness.  Please update me as discussed.  It was a pleasure to see you today! Allie Bossier, NP-C

## 2020-12-14 NOTE — Assessment & Plan Note (Signed)
Increased anxiety over the last month, seems situational with her occupation/illness.  She seems to be doing well overall on Zoloft 50 mg, discussed that we can increase the dose if needed in the future. Would rather her not be on benzos, is taking lorazepam twice monthly on average.  She agrees to try hydroxyzine, discussed dose and potential side effects.  Continue Zoloft 50 mg daily. Add hydroxyzine PRN. She will update

## 2020-12-16 NOTE — Telephone Encounter (Signed)
On 1/14 you gave 30 day. Ok to send in or hold off until patient follow up with you?

## 2020-12-18 NOTE — Telephone Encounter (Signed)
No, 30 day supply for now until she updates me. This is a new medication for her.

## 2020-12-19 DIAGNOSIS — F411 Generalized anxiety disorder: Secondary | ICD-10-CM

## 2021-01-02 MED ORDER — HYDROXYZINE HCL 10 MG PO TABS: 10.0000 mg | ORAL_TABLET | Freq: Two times a day (BID) | ORAL | 0 refills | Status: DC | PRN

## 2021-01-02 MED ORDER — SERTRALINE HCL 100 MG PO TABS: 100.0000 mg | ORAL_TABLET | Freq: Every day | ORAL | 1 refills | Status: DC

## 2021-01-11 ENCOUNTER — Other Ambulatory Visit: Payer: Self-pay | Admitting: Primary Care

## 2021-01-11 DIAGNOSIS — F411 Generalized anxiety disorder: Secondary | ICD-10-CM

## 2021-01-11 NOTE — Telephone Encounter (Signed)
Refill request Hydroxyzine Last refill 01/02/21 #60 Last office visit 12/14/20 video

## 2021-01-22 ENCOUNTER — Ambulatory Visit: Payer: BC Managed Care – PPO | Admitting: Primary Care

## 2021-01-23 ENCOUNTER — Encounter: Payer: Self-pay | Admitting: Primary Care

## 2021-01-23 ENCOUNTER — Other Ambulatory Visit: Payer: Self-pay

## 2021-01-23 ENCOUNTER — Telehealth: Payer: Self-pay | Admitting: Primary Care

## 2021-01-23 ENCOUNTER — Ambulatory Visit (INDEPENDENT_AMBULATORY_CARE_PROVIDER_SITE_OTHER): Payer: BC Managed Care – PPO | Admitting: Primary Care

## 2021-01-23 ENCOUNTER — Ambulatory Visit (INDEPENDENT_AMBULATORY_CARE_PROVIDER_SITE_OTHER)
Admission: RE | Admit: 2021-01-23 | Discharge: 2021-01-23 | Disposition: A | Discharge status: Home / Self Care | Payer: BC Managed Care – PPO | Source: Ambulatory Visit | Attending: Primary Care | Admitting: Primary Care

## 2021-01-23 VITALS — BP 110/82 | HR 94 | Temp 98.6°F | Ht 65.0 in | Wt 271.0 lb

## 2021-01-23 DIAGNOSIS — R1010 Upper abdominal pain, unspecified: Secondary | ICD-10-CM

## 2021-01-23 DIAGNOSIS — R197 Diarrhea, unspecified: Secondary | ICD-10-CM

## 2021-01-23 DIAGNOSIS — R109 Unspecified abdominal pain: Secondary | ICD-10-CM | POA: Diagnosis not present

## 2021-01-23 HISTORY — DX: Upper abdominal pain, unspecified: R10.10

## 2021-01-23 LAB — CBC WITH DIFFERENTIAL/PLATELET
Basophils Absolute: 0.1 10*3/uL (ref 0.0–0.1)
Basophils Relative: 1 % (ref 0.0–3.0)
Eosinophils Absolute: 0.3 10*3/uL (ref 0.0–0.7)
Eosinophils Relative: 3.7 % (ref 0.0–5.0)
HCT: 42.8 % (ref 36.0–46.0)
Hemoglobin: 14.1 g/dL (ref 12.0–15.0)
Lymphocytes Relative: 27.6 % (ref 12.0–46.0)
Lymphs Abs: 2.4 10*3/uL (ref 0.7–4.0)
MCHC: 33 g/dL (ref 30.0–36.0)
MCV: 83.8 fl (ref 78.0–100.0)
Monocytes Absolute: 0.4 10*3/uL (ref 0.1–1.0)
Monocytes Relative: 4.6 % (ref 3.0–12.0)
Neutro Abs: 5.4 10*3/uL (ref 1.4–7.7)
Neutrophils Relative %: 63.1 % (ref 43.0–77.0)
Platelets: 226 10*3/uL (ref 150.0–400.0)
RBC: 5.11 Mil/uL (ref 3.87–5.11)
RDW: 14.7 % (ref 11.5–15.5)
WBC: 8.6 10*3/uL (ref 4.0–10.5)

## 2021-01-23 LAB — COMPREHENSIVE METABOLIC PANEL
ALT: 21 U/L (ref 0–35)
AST: 15 U/L (ref 0–37)
Albumin: 4 g/dL (ref 3.5–5.2)
Alkaline Phosphatase: 69 U/L (ref 39–117)
BUN: 8 mg/dL (ref 6–23)
CO2: 28 mEq/L (ref 19–32)
Calcium: 8.9 mg/dL (ref 8.4–10.5)
Chloride: 100 mEq/L (ref 96–112)
Creatinine, Ser: 0.68 mg/dL (ref 0.40–1.20)
GFR: 105.73 mL/min (ref >60.00)
Glucose, Bld: 309 mg/dL — ABNORMAL HIGH (ref 70–99)
Potassium: 4.9 mEq/L (ref 3.5–5.1)
Sodium: 134 mEq/L — ABNORMAL LOW (ref 135–145)
Total Bilirubin: 0.5 mg/dL (ref 0.2–1.2)
Total Protein: 6.8 g/dL (ref 6.0–8.3)

## 2021-01-23 LAB — LIPASE: Lipase: 13 U/L (ref 11.0–59.0)

## 2021-01-23 LAB — H. PYLORI ANTIBODY, IGG: H Pylori IgG: NEGATIVE

## 2021-01-23 IMAGING — DX DG ABDOMEN 2V
4 series · 4 of 4 positions shown · non-contrast
Comparison: None.

CLINICAL DATA: Left upper quadrant abdominal pain.

EXAM:
ABDOMEN - 2 VIEW

[abdomen erect (1 of 2)]
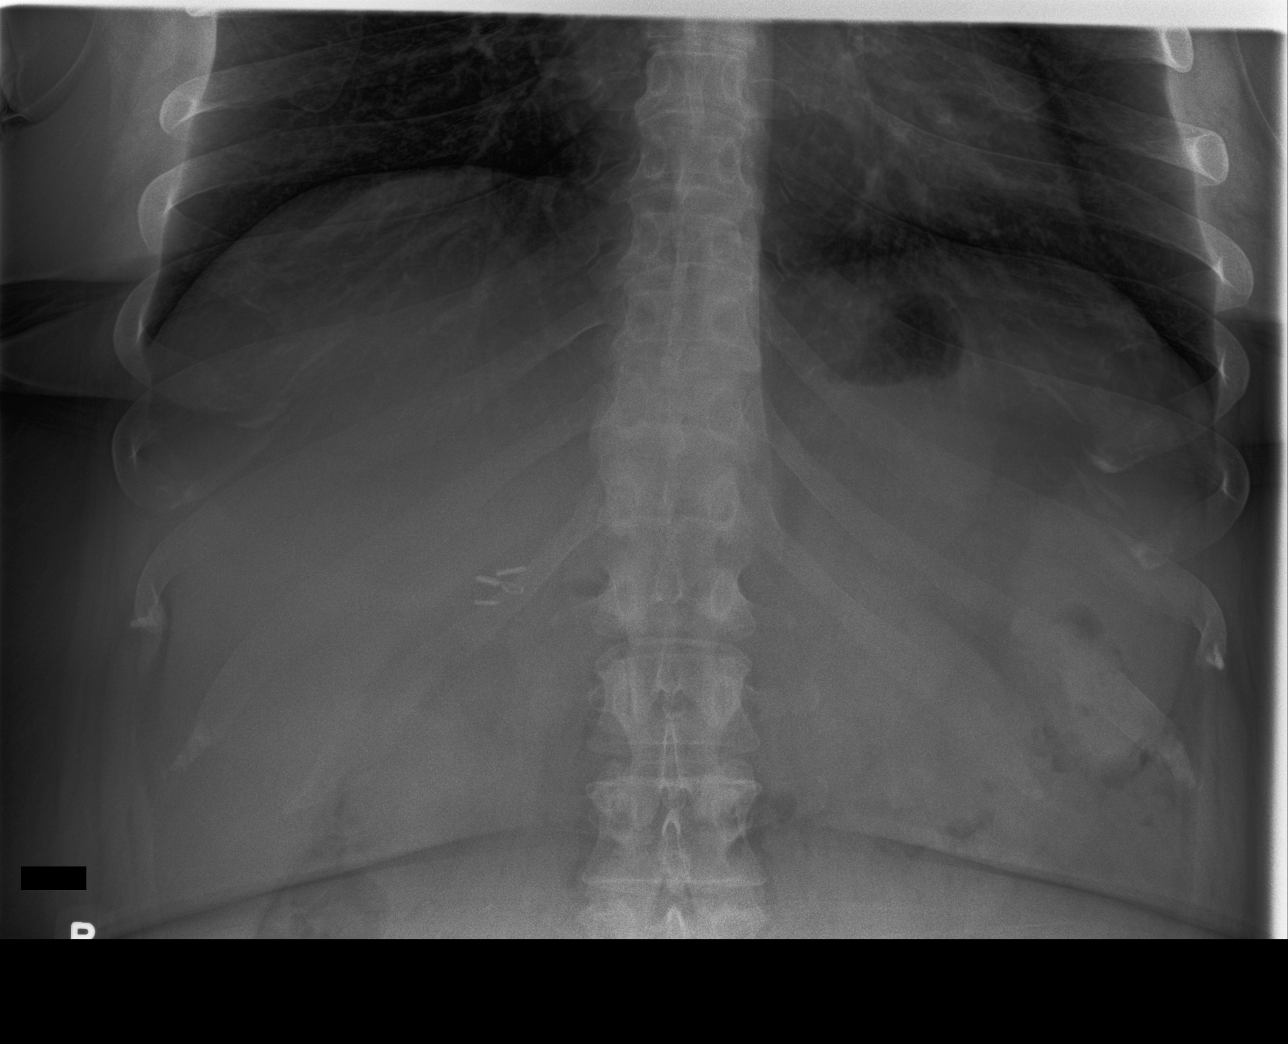

[abdomen supine (1 of 2)]
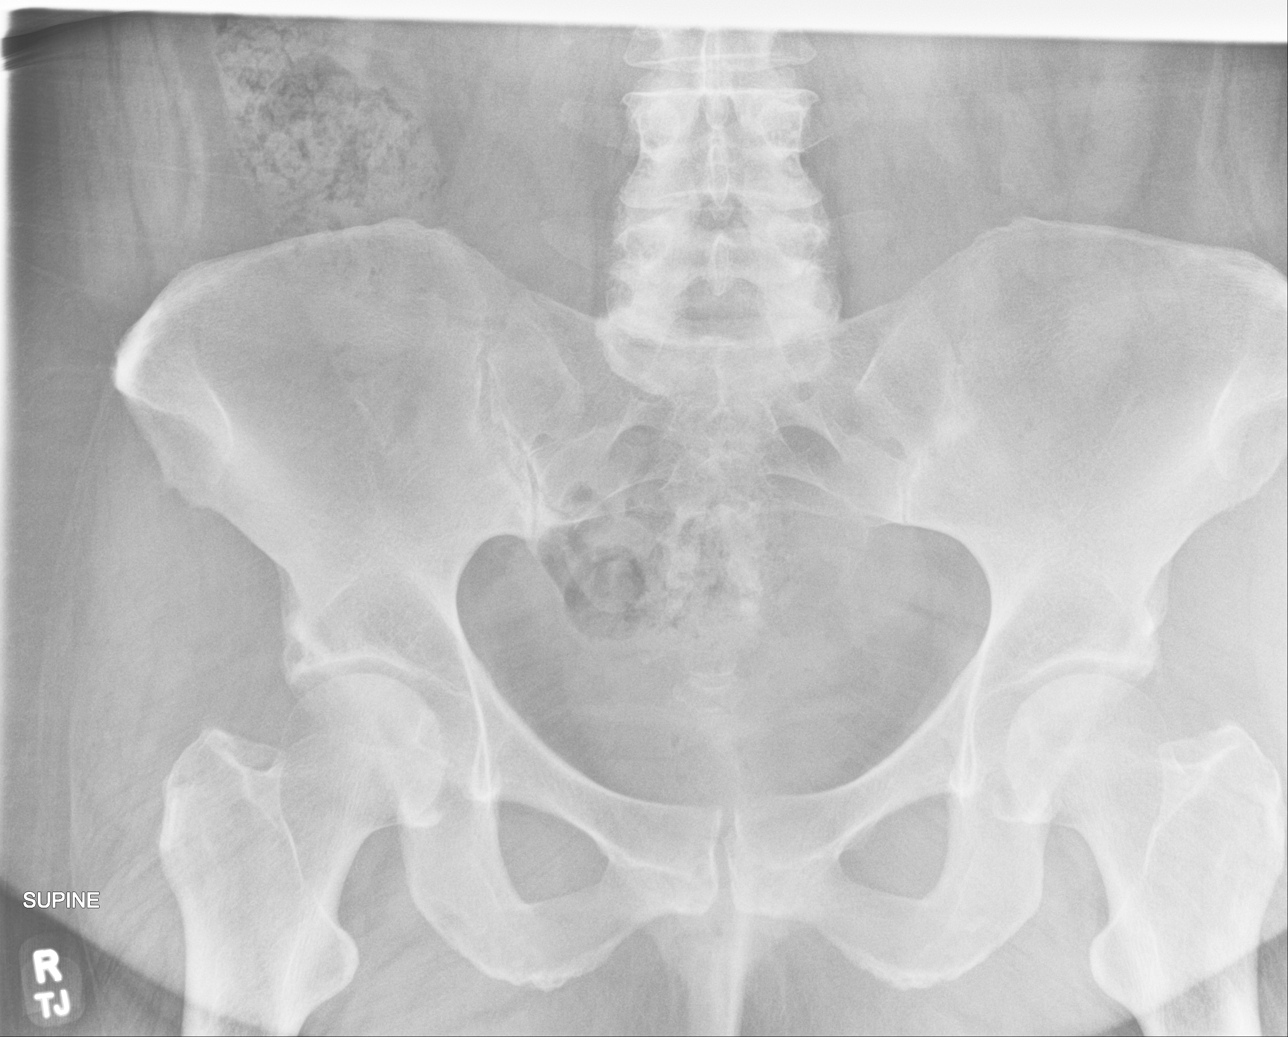

[abdomen supine (2 of 2)]
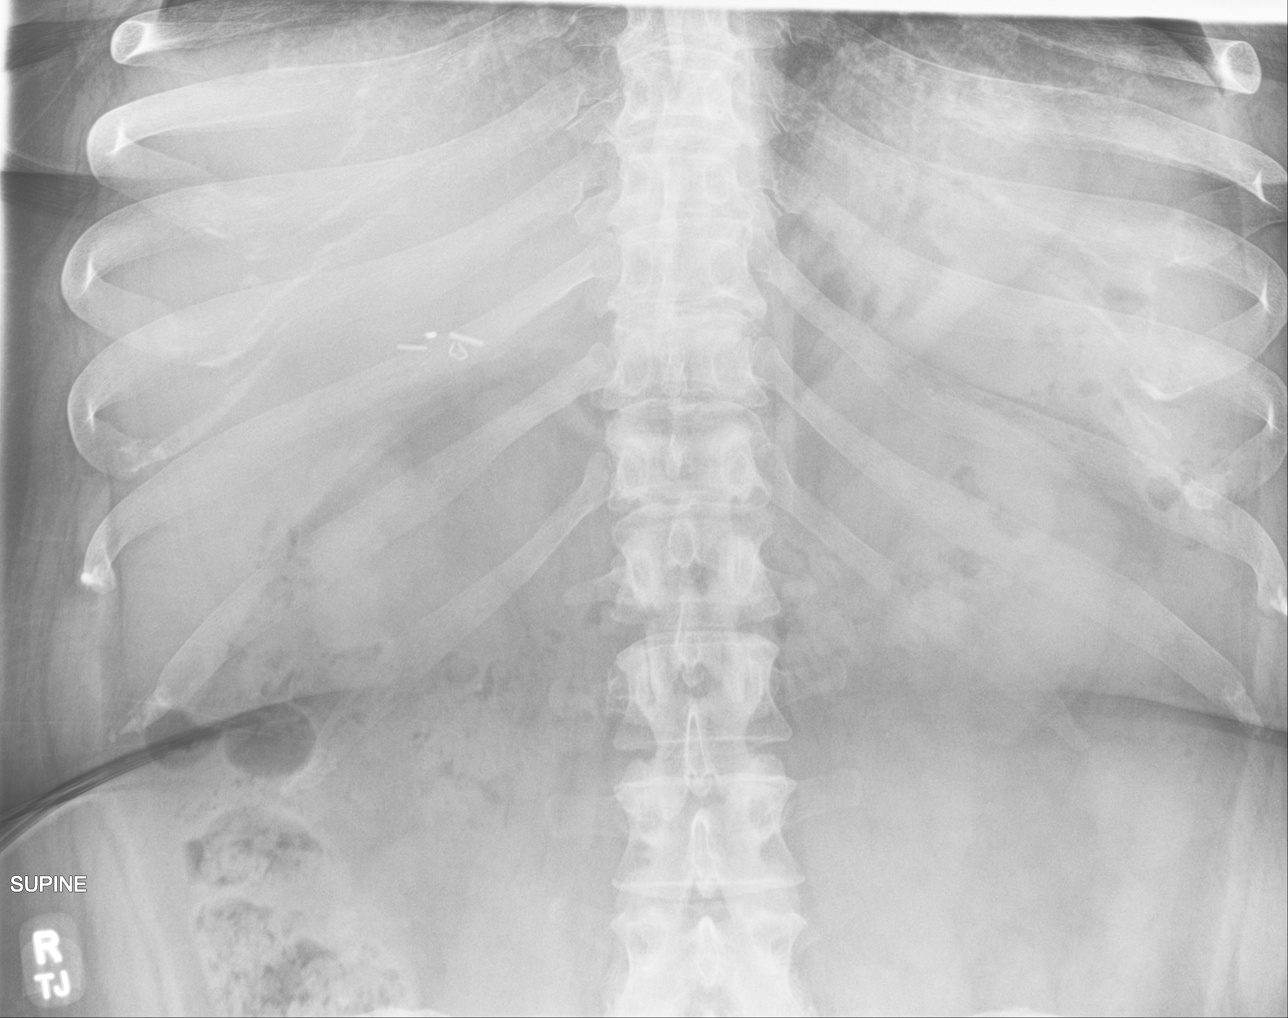

[abdomen erect (2 of 2)]
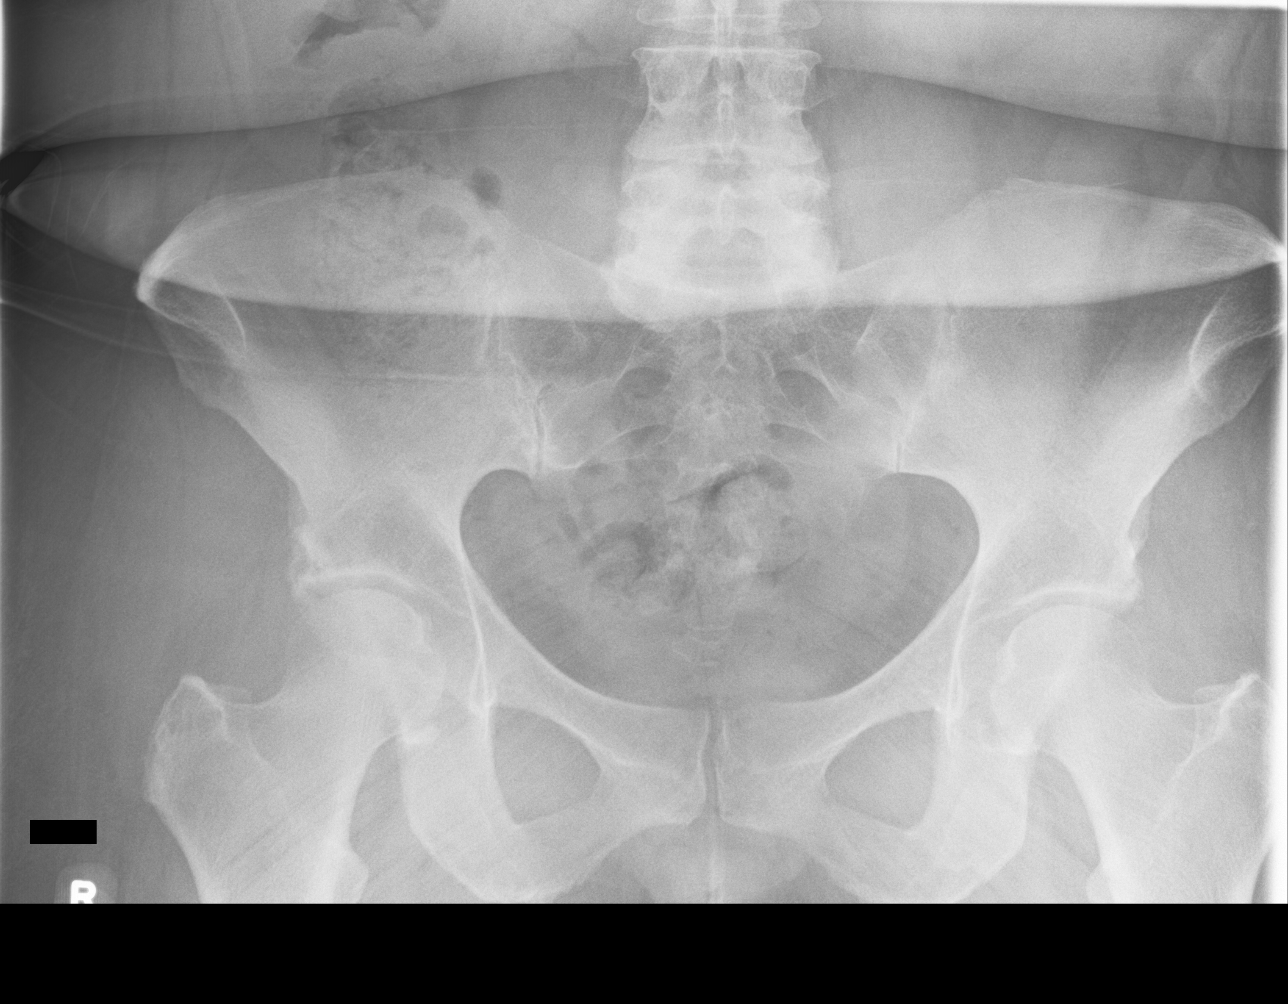

[4 of 4 positions shown; findings below may reference images not displayed]

FINDINGS: There is no evidence of intraperitoneal free air. There is an
ordinary amount of stool in the colon. No dilated loops of bowel are
seen to suggest obstruction. No radiopaque calculi are identified
overlying either kidney or along the expected course of the ureters.
Cholecystectomy clips are noted. The visualized lung bases are
clear. No acute osseous abnormality is identified.
IMPRESSION: Negative.

## 2021-01-23 MED ORDER — SUCRALFATE 1 GM/10ML PO SUSP: 1.0000 g | Freq: Three times a day (TID) | ORAL | 0 refills | Status: DC | PRN

## 2021-01-23 NOTE — Progress Notes (Signed)
Subjective:    Patient ID: Kelli Cox, female    DOB: Feb 28, 1976, 45 y.o.   MRN: [id number]  HPI  This visit occurred during the SARS-CoV-2 public health emergency.  Safety protocols were in place, including screening questions prior to the visit, additional usage of staff PPE, and extensive cleaning of exam room while observing appropriate contact time as indicated for disinfecting solutions.   Kelli Cox is a 45 year old female with a history of asthma, type 2 diabetes, low back pain, GAD who presents today with a chief complaint of abdominal pain.  Her pain is located to the LUQ which began about 3-4 months ago, intermittently. One week ago she began to notice her pain increasing in frequency and is daily. She describes her pain as sharp, stabbing. She's experienced nausea and fatigue with pain, also intermittent diarrhea. Last night her pain began to radiate from the LUQ to the RUQ region. One week ago she had several episodes of vomiting and diarrhea.   She denies fevers, constipation, urinary symptoms, flank pain. Pain is mostly worse within 30-60 minutes after eating. She underwent endoscopy in 2018, was told that she has a "slow digestive system". Antibiotic use in January 2022, Doxycycline for "bronchitis". History of cholecystectomy.   She's not taking anything OTC for symptoms, she has taken omeprazole a few times with little improvement. She's never undergone colonoscopy.   BP Readings from Last 3 Encounters:  01/23/21 110/82  11/09/20 (!) 125/93  10/01/20 136/82   Wt Readings from Last 3 Encounters:  01/23/21 271 lb (122.9 kg)  12/14/20 261 lb (118.4 kg)  11/09/20 260 lb (117.9 kg)     Review of Systems  Constitutional: Positive for fatigue. Negative for fever.  Gastrointestinal: Positive for abdominal pain, diarrhea, nausea and vomiting.       Past Medical History:  Diagnosis Date  . Asthma   . Chicken pox   . GAD (generalized anxiety disorder)   . Migraines   .  Type 2 diabetes mellitus ([place])      Social History   Socioeconomic History  . Marital status: Single    Spouse name: Not on file  . Number of children: Not on file  . Years of education: Not on file  . Highest education level: Master's degree (e.g., MA, MS, MEng, MEd, MSW, MBA)  Occupational History  . Occupation: Government social research officer  Tobacco Use  . Smoking status: Never Smoker  . Smokeless tobacco: Never Used  Substance and Sexual Activity  . Alcohol use: Yes    Comment: 1 drink every two weeks   . Drug use: Never  . Sexual activity: Yes    Partners: Male  Other Topics Concern  . Not on file  Social History Narrative  . Not on file   Social Determinants of Health   Financial Resource Strain: Not on file  Food Insecurity: Not on file  Transportation Needs: Not on file  Physical Activity: Not on file  Stress: Not on file  Social Connections: Not on file  Intimate Partner Violence: Not on file    Past Surgical History:  Procedure Laterality Date  . CHOLECYSTECTOMY  2008  . LIPOMA RESECTION  2012  . WRIST SURGERY Left    2001 and 2007    Family History  Problem Relation Age of Onset  . Arthritis Mother   . Asthma Mother   . Depression Mother   . Hyperlipidemia Mother   . Hypertension Mother   . Kidney disease Mother   .  Cancer Father   . Diabetes Father   . Early death Father   . Hyperlipidemia Father   . Hypertension Father   . Heart failure Father   . Asthma Sister   . Hypertension Sister   . Miscarriages / Stillbirths Sister   . Alcohol abuse Brother   . Hypertension Brother   . Osteoarthritis Maternal Grandmother   . Asthma Maternal Grandmother   . Cancer Maternal Grandmother   . COPD Maternal Grandmother   . Depression Maternal Grandmother   . Early death Maternal Grandmother   . Osteoarthritis Maternal Grandfather   . Asthma Maternal Grandfather   . COPD Maternal Grandfather   . Hypertension Maternal Grandfather   . Hyperlipidemia Maternal  Grandfather   . Cancer Paternal Grandmother   . Diabetes Paternal Grandmother   . Diabetes Paternal Grandfather   . Hyperlipidemia Paternal Grandfather   . Heart failure Paternal Grandfather   . Kidney disease Paternal Grandfather   . Hyperlipidemia Other   . Heart failure Other   . Kidney disease Other     Allergies  Allergen Reactions  . Clarithromycin Nausea And Vomiting    Current Outpatient Medications on File Prior to Visit  Medication Sig Dispense Refill  . albuterol (PROVENTIL) (2.5 MG/3ML) 0.083% nebulizer solution Take 3 mLs (2.5 mg total) by nebulization every 6 (six) hours as needed for wheezing or shortness of breath. 75 mL 0  . albuterol (VENTOLIN HFA) 108 (90 Base) MCG/ACT inhaler Inhale 1-2 puffs into the lungs every 6 (six) hours as needed for wheezing or shortness of breath. 1 each 0  . Ascorbic Acid (VITAMIN C) 500 MG CAPS Take by mouth.    . Azelastine-Fluticasone 137-50 MCG/ACT SUSP Place into the nose.    . butalbital-aspirin-caffeine (FIORINAL) 50-325-40 MG capsule Take by mouth.    . cetirizine (ZYRTEC) 10 MG tablet Take 1 tablet (10 mg total) by mouth daily. 30 tablet 11  . fluticasone (FLONASE) 50 MCG/ACT nasal spray Place 2 sprays into both nostrils daily. 16 g 6  . Fluticasone-Salmeterol (ADVAIR) 100-50 MCG/DOSE AEPB Inhale into the lungs.    . hydrOXYzine (ATARAX/VISTARIL) 10 MG tablet Take 1-2 tablets (10-20 mg total) by mouth 2 (two) times daily as needed for anxiety. 60 tablet 0  . sertraline (ZOLOFT) 100 MG tablet Take 1 tablet (100 mg total) by mouth daily. For anxiety 90 tablet 1  . vitamin E 180 MG (400 UNITS) capsule Take 400 Units by mouth daily.     No current facility-administered medications on file prior to visit.    BP 110/82   Pulse 94   Temp 98.6 F (37 C) (Temporal)   Ht 5\' 5"  (1.651 m)   Wt 271 lb (122.9 kg)   LMP 10/29/2020   SpO2 98%   BMI 45.10 kg/m    Objective:   Physical Exam Constitutional:      Appearance: She is  well-nourished.     Comments: Mild distress, doesn't appear to be feeling well   Cardiovascular:     Rate and Rhythm: Normal rate and regular rhythm.  Pulmonary:     Effort: Pulmonary effort is normal.     Breath sounds: Normal breath sounds.  Abdominal:     General: Abdomen is flat. Bowel sounds are normal. There is no distension.     Palpations: Abdomen is soft.     Tenderness: There is abdominal tenderness in the left upper quadrant. There is no right CVA tenderness or left CVA tenderness.  Musculoskeletal:  Cervical back: Neck supple.  Skin:    General: Skin is warm and dry.  Psychiatric:        Mood and Affect: Mood and affect normal.            Assessment & Plan:

## 2021-01-23 NOTE — Telephone Encounter (Signed)
Please advise seen in office today

## 2021-01-23 NOTE — Assessment & Plan Note (Signed)
Chronic and intermittent for the last 3-4 months, more persistent over last week.  Differentials include PUD, H pylori, renal stone, C-diff.  Checking H pylori stool, GI pathogen stool, CBC, Lipase, CMP.  Abdominal plain films pending.  Strict ED precautions provided.

## 2021-01-23 NOTE — Addendum Note (Signed)
Addended by: Pleas Koch on: 01/23/2021 07:24 PM   Modules accepted: Orders

## 2021-01-23 NOTE — Telephone Encounter (Addendum)
Kelli Cox or Kelli Cox, can you add A1C to this patient's labs? Dx: hyperglycemia  [name], please call patient and notify her that I will send in some Carafate to help with her pain.  See result note.

## 2021-01-23 NOTE — Patient Instructions (Addendum)
Stop by the lab and xray prior to leaving today. I will notify you of your results once received.   If your symptoms get a lot worse, you develop fevers, go to the hospital.  It was a pleasure to see you today!

## 2021-01-23 NOTE — Telephone Encounter (Signed)
Patient states that her pain is still really bad even after taking Advil. She is asking if there is anything we can give her to help her with the pain. Please advise. EM

## 2021-01-24 ENCOUNTER — Other Ambulatory Visit (INDEPENDENT_AMBULATORY_CARE_PROVIDER_SITE_OTHER): Payer: BC Managed Care – PPO

## 2021-01-24 DIAGNOSIS — R7309 Other abnormal glucose: Secondary | ICD-10-CM | POA: Diagnosis not present

## 2021-01-24 LAB — HEMOGLOBIN A1C: Hgb A1c MFr Bld: 7.5 % — ABNORMAL HIGH (ref 4.6–6.5)

## 2021-01-24 NOTE — Telephone Encounter (Signed)
Per cover my meds Josem Kaufmann was needed for med. I have started but will take 72 hours for answer. I have called patient and let know would like to know if any other thing can be called in that can be covered    [name] Key: [facility] - Rx #: [id number] Need help? Call us at [phone] Status Sent to [facility] Sucralfate 1GM/10ML suspension Form Blue Building control surveyor Form (CB) Original Claim Info 75 DRUG REQUIRES PRIOR AUTHORIZATION

## 2021-01-25 LAB — GASTROINTESTINAL PATHOGEN PANEL PCR
C. difficile Tox A/B, PCR: NOT DETECTED
Campylobacter, PCR: NOT DETECTED
Cryptosporidium, PCR: NOT DETECTED
E coli (ETEC) LT/ST PCR: NOT DETECTED
E coli (STEC) stx1/stx2, PCR: NOT DETECTED
E coli 0157, PCR: NOT DETECTED
Giardia lamblia, PCR: NOT DETECTED
Norovirus, PCR: NOT DETECTED
Rotavirus A, PCR: NOT DETECTED
Salmonella, PCR: NOT DETECTED
Shigella, PCR: NOT DETECTED

## 2021-01-25 MED ORDER — OMEPRAZOLE 20 MG PO CPDR: DELAYED_RELEASE_CAPSULE | ORAL | 0 refills | Status: DC

## 2021-01-25 NOTE — Telephone Encounter (Signed)
Called patient reviewed all information and repeated back to me. Will call if any questions.  She will start new meds and let us know if any issues.

## 2021-01-25 NOTE — Addendum Note (Signed)
Addended by: Pleas Koch on: 01/25/2021 06:45 AM   Modules accepted: Orders

## 2021-01-25 NOTE — Telephone Encounter (Signed)
Left message to return call to our office.  

## 2021-01-25 NOTE — Telephone Encounter (Addendum)
Have her start omeprazole 20 mg once or twice daily, I'll send to her pharmacy now. Also, her type 2 diabetes has returned, I sent her a result note, please have her respond to me via [facility].

## 2021-02-08 ENCOUNTER — Other Ambulatory Visit: Payer: Self-pay | Admitting: Primary Care

## 2021-02-08 DIAGNOSIS — R1010 Upper abdominal pain, unspecified: Secondary | ICD-10-CM

## 2021-02-09 NOTE — Telephone Encounter (Signed)
Is she taking omeprazole 20 mg at all? If so, how much?

## 2021-02-11 NOTE — Telephone Encounter (Signed)
Patient called in and stated she does not take the omeprazole at all.

## 2021-02-11 NOTE — Telephone Encounter (Signed)
Left message to return call to our office.  

## 2021-02-20 ENCOUNTER — Ambulatory Visit: Payer: BC Managed Care – PPO | Admitting: Primary Care

## 2021-02-22 ENCOUNTER — Other Ambulatory Visit: Payer: Self-pay

## 2021-02-22 DIAGNOSIS — F411 Generalized anxiety disorder: Secondary | ICD-10-CM

## 2021-02-22 MED ORDER — HYDROXYZINE HCL 10 MG PO TABS
10.0000 mg | ORAL_TABLET | Freq: Two times a day (BID) | ORAL | 0 refills | Status: DC | PRN
Start: 2021-02-22 — End: 2021-07-25

## 2021-02-22 NOTE — Telephone Encounter (Signed)
Refills sent to pharmacy. 

## 2021-02-22 NOTE — Telephone Encounter (Signed)
6 month f/u scheduled on 03/26/21, last filled on 01/02/21 #60 tabs with 0 refills

## 2021-02-25 ENCOUNTER — Ambulatory Visit (INDEPENDENT_AMBULATORY_CARE_PROVIDER_SITE_OTHER): Payer: BC Managed Care – PPO | Admitting: Family Medicine

## 2021-02-25 ENCOUNTER — Other Ambulatory Visit: Payer: Self-pay

## 2021-02-25 ENCOUNTER — Encounter: Payer: Self-pay | Admitting: Family Medicine

## 2021-02-25 VITALS — BP 124/90 | HR 91 | Temp 97.1°F | Ht 65.0 in | Wt 270.5 lb

## 2021-02-25 DIAGNOSIS — S46812A Strain of other muscles, fascia and tendons at shoulder and upper arm level, left arm, initial encounter: Secondary | ICD-10-CM | POA: Diagnosis not present

## 2021-02-25 DIAGNOSIS — M25511 Pain in right shoulder: Secondary | ICD-10-CM | POA: Diagnosis not present

## 2021-02-25 DIAGNOSIS — M542 Cervicalgia: Secondary | ICD-10-CM

## 2021-02-25 DIAGNOSIS — S46811S Strain of other muscles, fascia and tendons at shoulder and upper arm level, right arm, sequela: Secondary | ICD-10-CM

## 2021-02-25 DIAGNOSIS — M25512 Pain in left shoulder: Secondary | ICD-10-CM

## 2021-02-25 MED ORDER — CYCLOBENZAPRINE HCL 10 MG PO TABS: 5.0000 mg | ORAL_TABLET | Freq: Every evening | ORAL | 0 refills | Status: DC | PRN

## 2021-02-25 MED ORDER — DICLOFENAC SODIUM 75 MG PO TBEC: 75.0000 mg | DELAYED_RELEASE_TABLET | Freq: Two times a day (BID) | ORAL | 1 refills | Status: DC
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
## 2021-03-05 ENCOUNTER — Telehealth: Payer: BC Managed Care – PPO | Admitting: Physician Assistant

## 2021-03-05 DIAGNOSIS — J4520 Mild intermittent asthma, uncomplicated: Secondary | ICD-10-CM | POA: Diagnosis not present

## 2021-03-05 DIAGNOSIS — B9689 Other specified bacterial agents as the cause of diseases classified elsewhere: Secondary | ICD-10-CM | POA: Diagnosis not present

## 2021-03-05 DIAGNOSIS — J019 Acute sinusitis, unspecified: Secondary | ICD-10-CM | POA: Diagnosis not present

## 2021-03-05 MED ORDER — AMOXICILLIN-POT CLAVULANATE 875-125 MG PO TABS: 1.0000 | ORAL_TABLET | Freq: Two times a day (BID) | ORAL | 0 refills | Status: DC

## 2021-03-05 NOTE — Progress Notes (Signed)
I have spent 5 minutes in review of e-visit questionnaire, review and updating patient chart, medical decision making and response to patient.   Samhita Kretsch Cody Geralda Baumgardner, PA-C    

## 2021-03-05 NOTE — Progress Notes (Signed)
We are sorry that you are not feeling well.  Here is how we plan to help!  Based on what you have shared with me it looks like you have sinusitis.  Sinusitis is inflammation and infection in the sinus cavities of the head.  Based on your presentation I believe you most likely have Acute Sinusitis (you are at higher risk of more serious infection due to history of asthma so I feel it is reasonable to start antibiotics in your case).   I have prescribed Augmentin 875mg /125mg  one tablet twice daily with food, for 7 days. You may use an oral decongestant such as Mucinex D or if you have glaucoma or high blood pressure use plain Mucinex. Saline nasal spray help and can safely be used as often as needed for congestion.  If you develop worsening sinus pain, fever or notice severe headache and vision changes, or if symptoms are not better after completion of antibiotic, please schedule an appointment with a health care provider.    Sinus infections are not as easily transmitted as other respiratory infection, however we still recommend that you avoid close contact with loved ones, especially the very young and elderly.  Remember to wash your hands thoroughly throughout the day as this is the number one way to prevent the spread of infection!  Home Care:  Only take medications as instructed by your medical team.  Complete the entire course of an antibiotic.  Do not take these medications with alcohol.  A steam or ultrasonic humidifier can help congestion.  You can place a towel over your head and breathe in the steam from hot water coming from a faucet.  Avoid close contacts especially the very young and the elderly.  Cover your mouth when you cough or sneeze.  Always remember to wash your hands.  Get Help Right Away If:  You develop worsening fever or sinus pain.  You develop a severe head ache or visual changes.  Your symptoms persist after you have completed your treatment plan.  Make sure  you  Understand these instructions.  Will watch your condition.  Will get help right away if you are not doing well or get worse.  Your e-visit answers were reviewed by a board certified advanced clinical practitioner to complete your personal care plan.  Depending on the condition, your plan could have included both over the counter or prescription medications.  If there is a problem please reply  once you have received a response from your provider.  Your safety is important to [place].  If you have drug allergies check your prescription carefully.    You can use MyChart to ask questions about today's visit, request a non-urgent call back, or ask for a work or school excuse for 24 hours related to this e-Visit. If it has been greater than 24 hours you will need to follow up with your provider, or enter a new e-Visit to address those concerns.  You will get an e-mail in the next two days asking about your experience.  I hope that your e-visit has been valuable and will speed your recovery. Thank you for using e-visits.

## 2021-03-07 ENCOUNTER — Telehealth: Payer: BC Managed Care – PPO | Admitting: Physician Assistant

## 2021-03-07 ENCOUNTER — Encounter: Payer: Self-pay | Admitting: Physician Assistant

## 2021-03-07 DIAGNOSIS — J4541 Moderate persistent asthma with (acute) exacerbation: Secondary | ICD-10-CM | POA: Diagnosis not present

## 2021-03-07 MED ORDER — PREDNISONE 20 MG PO TABS: 40.0000 mg | ORAL_TABLET | Freq: Every day | ORAL | 0 refills | Status: DC

## 2021-03-07 NOTE — Patient Instructions (Signed)
Instructions sent to patients MyChart.

## 2021-03-07 NOTE — Progress Notes (Signed)
Kelli Cox, Kelli Cox are scheduled for a virtual visit with your provider today.    Just as we do with appointments in the office, we must obtain your consent to participate.  Your consent will be active for this visit and any virtual visit you may have with one of our providers in the next 365 days.    If you have a MyChart account, I can also send a copy of this consent to you electronically.  All virtual visits are billed to your insurance company just like a traditional visit in the office.  As this is a virtual visit, video technology does not allow for your provider to perform a traditional examination.  This may limit your provider's ability to fully assess your condition.  If your provider identifies any concerns that need to be evaluated in person or the need to arrange testing such as labs, EKG, etc, we will make arrangements to do so.    Although advances in technology are sophisticated, we cannot ensure that it will always work on either your end or our end.  If the connection with a video visit is poor, we may have to switch to a telephone visit.  With either a video or telephone visit, we are not always able to ensure that we have a secure connection.   I need to obtain your verbal consent now.   Are you willing to proceed with your visit today?   Kelli Cox has provided verbal consent on 03/07/2021 for a virtual visit (video or telephone).  Leeanne Rio, PA-C 03/07/2021  12:03 PM  Virtual Visit via Video   I connected with patient on 03/07/21 at 12:15 PM EDT by a video enabled telemedicine application and verified that I am speaking with the correct person using two identifiers.  Location patient: Home Location provider: [place] participating in the virtual visit: Patient, Provider  I discussed the limitations of evaluation and management by telemedicine and the availability of in person appointments. The patient expressed understanding and agreed to  proceed.  Subjective:   HPI:  Patient presents via [place] today to discuss likely asthma exacerbation. Patient seen 2 days ago via e-visit by this provider for symptoms concerning for bronchitis. Giving her immunocompromise from diabetes, she was started on supportive measures and OTC medications for her symptoms, while being placed on antibiotic (augmentin). Notes following care as directed with some noted increase in wheezing and chest tightness, requiring her to use her rescue inhaler in addition to her Advair. Denies any chest pain. Denies fever at present. Notes she used her rescue inhaler 2 x yesterday. Is taking her Augmentin as directed and tolerating well. Again has had negative COVID test. States she reached out to her PCP office to discuss symptoms but was redirected back to Telehealth.   ROS:  See pertinent positives and negatives per HPI.  Patient Active Problem List   Diagnosis Date Noted  . Pain of upper abdomen 01/23/2021  . Acute non-recurrent sinusitis 10/17/2020  . Diabetes mellitus without complication ([place]) 02/63/7858  . Migraines 10/01/2020  . Acute right-sided low back pain with right-sided sciatica 10/01/2020  . Asthma 10/01/2020  . GAD (generalized anxiety disorder) 10/01/2020    Social History   Tobacco Use  . Smoking status: Never Smoker  . Smokeless tobacco: Never Used  Substance Use Topics  . Alcohol use: Yes    Comment: 1 drink every two weeks     Current Outpatient Medications:  .  albuterol (  PROVENTIL) (2.5 MG/3ML) 0.083% nebulizer solution, Take 3 mLs (2.5 mg total) by nebulization every 6 (six) hours as needed for wheezing or shortness of breath., Disp: 75 mL, Rfl: 0 .  amoxicillin-clavulanate (AUGMENTIN) 875-125 MG tablet, Take 1 tablet by mouth 2 (two) times daily., Disp: 14 tablet, Rfl: 0 .  Ascorbic Acid (VITAMIN C) 500 MG CAPS, Take by mouth., Disp: , Rfl:  .  butalbital-aspirin-caffeine (FIORINAL) 50-325-40 MG capsule, Take by mouth.,  Disp: , Rfl:  .  cetirizine (ZYRTEC) 10 MG tablet, Take 1 tablet (10 mg total) by mouth daily., Disp: 30 tablet, Rfl: 11 .  cyclobenzaprine (FLEXERIL) 10 MG tablet, Take 0.5-1 tablets (5-10 mg total) by mouth at bedtime as needed for muscle spasms., Disp: 30 tablet, Rfl: 0 .  diclofenac (VOLTAREN) 75 MG EC tablet, Take 1 tablet (75 mg total) by mouth 2 (two) times daily., Disp: 60 tablet, Rfl: 1 .  Fluticasone-Salmeterol (ADVAIR) 100-50 MCG/DOSE AEPB, Inhale into the lungs., Disp: , Rfl:  .  hydrOXYzine (ATARAX/VISTARIL) 10 MG tablet, Take 1-2 tablets (10-20 mg total) by mouth 2 (two) times daily as needed for anxiety., Disp: 60 tablet, Rfl: 0 .  metFORMIN (GLUCOPHAGE) 1000 MG tablet, Take 1,000 mg by mouth daily., Disp: , Rfl:  .  sertraline (ZOLOFT) 100 MG tablet, Take 1 tablet (100 mg total) by mouth daily. For anxiety, Disp: 90 tablet, Rfl: 1 .  vitamin E 180 MG (400 UNITS) capsule, Take 400 Units by mouth daily., Disp: , Rfl:   Allergies  Allergen Reactions  . Clarithromycin Nausea And Vomiting    Objective:   There were no vitals taken for this visit.  Patient is well-developed, well-nourished in no acute distress.  Resting comfortably at home.  Head is normocephalic, atraumatic.  No labored breathing. Faint wheeze audible on exhalation. Speech is clear and coherent with logical content.  Patient is alert and oriented at baseline.   Assessment and Plan:   1. Moderate persistent asthma with exacerbation Mild asthma exacerbation on top of her bronchitis with significant bronchospasm as a main cough contributor. Will have her continue current treatment with addition of Mucinex and a 5-day burst of 40 mg prednisone. Strict Urgent Care and ER precautions reviewed with patient who voiced understanding and agreement with the plan.  - predniSONE (DELTASONE) 20 MG tablet; Take 2 tablets (40 mg total) by mouth daily with breakfast.  Dispense: 10 tablet; Refill: 0 .  Leeanne Rio,  [place] 03/07/2021

## 2021-03-07 NOTE — Telephone Encounter (Signed)
Called patient she was seen at E visit yesterday recommended she send message to provider seen at that time. If not able to help let our office k now and we will set up another virtual with someone in our office.

## 2021-03-08 ENCOUNTER — Other Ambulatory Visit: Payer: Self-pay

## 2021-03-08 DIAGNOSIS — J4520 Mild intermittent asthma, uncomplicated: Secondary | ICD-10-CM

## 2021-03-12 MED ORDER — ALBUTEROL SULFATE (2.5 MG/3ML) 0.083% IN NEBU: 2.5000 mg | INHALATION_SOLUTION | Freq: Four times a day (QID) | RESPIRATORY_TRACT | 0 refills | Status: DC | PRN

## 2021-03-20 ENCOUNTER — Telehealth: Payer: BC Managed Care – PPO | Admitting: Physician Assistant

## 2021-03-20 DIAGNOSIS — R112 Nausea with vomiting, unspecified: Secondary | ICD-10-CM

## 2021-03-20 MED ORDER — ONDANSETRON 4 MG PO TBDP: 4.0000 mg | ORAL_TABLET | Freq: Three times a day (TID) | ORAL | 0 refills | Status: DC | PRN

## 2021-03-20 NOTE — Progress Notes (Signed)
I have spent 5 minutes in review of e-visit questionnaire, review and updating patient chart, medical decision making and response to patient.   Zanaiya Calabria Cody Laurinda Carreno, PA-C    

## 2021-03-20 NOTE — Progress Notes (Signed)
We are sorry that you are not feeling well. Here is how we plan to help!  Based on what you have shared with me it looks like you have a Virus that is irritating your GI tract.  Vomiting is the forceful emptying of a portion of the stomach's content through the mouth.  Although nausea and vomiting can make you feel miserable, it's important to remember that these are not diseases, but rather symptoms of an underlying illness.  When we treat short term symptoms, we always caution that any symptoms that persist should be fully evaluated in a medical office.  I have prescribed a medication that will help alleviate your symptoms and allow you to stay hydrated:  Zofran 4 mg 1 tablet every 8 hours as needed for nausea and vomiting  HOME CARE:  Drink clear liquids.  This is very important! Dehydration (the lack of fluid) can lead to a serious complication.  Start off with 1 tablespoon every 5 minutes for 8 hours.  You may begin eating bland foods after 8 hours without vomiting.  Start with saltine crackers, white bread, rice, mashed potatoes, applesauce.  After 48 hours on a bland diet, you may resume a normal diet.  Try to go to sleep.  Sleep often empties the stomach and relieves the need to vomit.  GET HELP RIGHT AWAY IF:   Your symptoms do not improve or worsen within 2 days after treatment.  You have a fever for over 3 days.  You cannot keep down fluids after trying the medication.  You note dark urine (amber, tea-colored)  MAKE SURE YOU:   Understand these instructions.  Will watch your condition.  Will get help right away if you are not doing well or get worse.   Thank you for choosing an e-visit. Your e-visit answers were reviewed by a board certified advanced clinical practitioner to complete your personal care plan. Depending upon the condition, your plan could have included both over the counter or prescription medications. Please review your pharmacy choice. Be sure that  the pharmacy you have chosen is open so that you can pick up your prescription now.  If there is a problem you may message your provider in [place] to have the prescription routed to another pharmacy. Your safety is important to [place]. If you have drug allergies check your prescription carefully.  For the next 24 hours, you can use MyChart to ask questions about today's visit, request a non-urgent call back, or ask for a work or school excuse from your e-visit provider. You will get an e-mail in the next two days asking about your experience. I hope that your e-visit has been valuable and will speed your recovery.

## 2021-03-26 ENCOUNTER — Ambulatory Visit: Payer: BC Managed Care – PPO | Admitting: Primary Care

## 2021-03-27 ENCOUNTER — Telehealth: Payer: BC Managed Care – PPO | Admitting: Physician Assistant

## 2021-03-27 ENCOUNTER — Other Ambulatory Visit: Payer: Self-pay

## 2021-03-27 ENCOUNTER — Ambulatory Visit
Admission: RE | Admit: 2021-03-27 | Discharge: 2021-03-27 | Disposition: A | Discharge status: Home / Self Care | Payer: BC Managed Care – PPO | Source: Ambulatory Visit

## 2021-03-27 VITALS — BP 125/74 | HR 102 | Temp 98.9°F | Resp 18

## 2021-03-27 DIAGNOSIS — B9789 Other viral agents as the cause of diseases classified elsewhere: Secondary | ICD-10-CM

## 2021-03-27 DIAGNOSIS — J069 Acute upper respiratory infection, unspecified: Secondary | ICD-10-CM | POA: Diagnosis not present

## 2021-03-27 DIAGNOSIS — Z1152 Encounter for screening for COVID-19: Secondary | ICD-10-CM

## 2021-03-27 DIAGNOSIS — J019 Acute sinusitis, unspecified: Secondary | ICD-10-CM

## 2021-03-27 MED ORDER — BENZONATATE 100 MG PO CAPS: 100.0000 mg | ORAL_CAPSULE | Freq: Three times a day (TID) | ORAL | 0 refills | Status: DC | PRN

## 2021-03-27 NOTE — ED Triage Notes (Signed)
Pt presents with dry cough, congestion, sinus pressure and tinnitus x 4 days.  Temp 100.2 this morning.  Last med Tylenol 0900.    Pt does not want COVID testing today.

## 2021-03-27 NOTE — Progress Notes (Signed)
Based on what you shared with me, I feel your condition warrants further evaluation and I recommend that you be seen for a face to face visit.  I see you have been treated with antibiotics and prednisone already this month. If you are continuing to have symptoms you really should be evaluated in person. Please contact your primary care physician practice to be seen. Many offices offer virtual options to be seen via video if you are not comfortable going in person to a medical facility at this time.  If you do not have a PCP, [facility] offers a free physician referral service available at [phone]. Our trained staff has the experience, knowledge and resources to put you in touch with a physician who is right for you.   You also have the option of a video visit through [email or web address]  If you are having a true medical emergency please call 911.  NOTE: If you entered your credit card information for this eVisit, you will not be charged. You may see a "hold" on your card for the $35 but that hold will drop off and you will not have a charge processed.  Your e-visit answers were reviewed by a board certified advanced clinical practitioner to complete your personal care plan.  Thank you for using e-Visits.

## 2021-03-27 NOTE — Discharge Instructions (Addendum)
Your COVID test is pending.  You should self quarantine until the test result is back.    Take the [facility] as needed for cough.  Take Tylenol or ibuprofen as needed for fever or discomfort; plain over-the-counter Mucinex as needed for congestion.  Rest and keep yourself hydrated.    Follow-up with your primary care provider if your symptoms are not improving.

## 2021-03-27 NOTE — ED Provider Notes (Signed)
[name]    CSN: [id number] Arrival date & time: 03/27/21  1151      History   Chief Complaint Chief Complaint  Patient presents with  . Cough    HPI Kelli Cox is a 45 y.o. female.   Patient presents with 4-day history of nasal congestion, postnasal drip, sinus pressure, nonproductive cough.  She reports low-grade fever this morning of 100.2.  Treatment at home with Tylenol.  She denies rash, sore throat, shortness of breath, wheezing, vomiting, diarrhea, or other symptoms.  Patient had an E-visit today and was instructed to be seen in person.  She had an E-visit on 03/20/2021; diagnosed with non-intractable vomiting with nausea; treated with Zofran.  She had an video visit on 03/07/2021; diagnosed with moderate persistent asthma exacerbation; treated with prednisone.  She had an E-visit on 03/05/2021; diagnosed with acute sinusitis and mild intermittent asthma; treated with Augmentin.  Patient's medical history includes asthma, diabetes, migraine headaches, low back pain, anxiety.  The history is provided by the patient and medical records.    Past Medical History:  Diagnosis Date  . Asthma   . Chicken pox   . GAD (generalized anxiety disorder)   . Migraines   . Type 2 diabetes mellitus [facility])     Patient Active Problem List   Diagnosis Date Noted  . Pain of upper abdomen 01/23/2021  . Acute non-recurrent sinusitis 10/17/2020  . Diabetes mellitus without complication ([place]) [id number]  . Migraines 10/01/2020  . Acute right-sided low back pain with right-sided sciatica 10/01/2020  . Asthma 10/01/2020  . GAD (generalized anxiety disorder) 10/01/2020    Past Surgical History:  Procedure Laterality Date  . CHOLECYSTECTOMY  2008  . LIPOMA RESECTION  2012  . WRIST SURGERY Left    2001 and 2007    OB History   No obstetric history on file.      Home Medications    Prior to Admission medications   Medication Sig Start Date End Date Taking? Authorizing  Provider  albuterol (PROVENTIL) (2.5 MG/3ML) 0.083% nebulizer solution Take 3 mLs (2.5 mg total) by nebulization every 6 (six) hours as needed for wheezing or shortness of breath. 03/12/21  Yes Pleas Koch, NP  albuterol (VENTOLIN HFA) 108 (90 Base) MCG/ACT inhaler Inhale into the lungs every 6 (six) hours as needed for wheezing or shortness of breath.   Yes [provider]  Ascorbic Acid (VITAMIN C) 500 MG CAPS Take by mouth.   Yes [provider]  benzonatate (TESSALON) 100 MG capsule Take 1 capsule (100 mg total) by mouth 3 (three) times daily as needed for cough. 03/27/21  Yes Sharion Balloon, NP  butalbital-aspirin-caffeine [facility]) [phone] MG capsule Take by mouth. 12/15/18  Yes [provider]  cetirizine (ZYRTEC) 10 MG tablet Take 1 tablet (10 mg total) by mouth daily. 10/10/20  Yes Hawks, Christy A, FNP  cyclobenzaprine (FLEXERIL) 10 MG tablet Take 0.5-1 tablets (5-10 mg total) by mouth at bedtime as needed for muscle spasms. 02/25/21 02/25/22 Yes Copland, Frederico Hamman, MD  Fluticasone-Salmeterol (ADVAIR) 100-50 MCG/DOSE AEPB Inhale into the lungs. 06/21/20  Yes [provider]  hydrOXYzine (ATARAX/VISTARIL) 10 MG tablet Take 1-2 tablets (10-20 mg total) by mouth 2 (two) times daily as needed for anxiety. 02/22/21  Yes Pleas Koch, NP  metFORMIN (GLUCOPHAGE) 1000 MG tablet Take 1,000 mg by mouth daily. 01/30/21  Yes [provider]  ondansetron (ZOFRAN ODT) 4 MG disintegrating tablet Take 1 tablet (4 mg total) by  mouth every 8 (eight) hours as needed for nausea or vomiting. 03/20/21  Yes Brunetta Jeans, PA-C  sertraline (ZOLOFT) 100 MG tablet Take 1 tablet (100 mg total) by mouth daily. For anxiety 01/02/21  Yes Pleas Koch, NP  vitamin E 180 MG (400 UNITS) capsule Take 400 Units by mouth daily.   Yes [provider]  diclofenac (VOLTAREN) 75 MG EC tablet Take 1 tablet (75 mg total) by mouth 2 (two) times daily. 02/25/21   Owens Loffler, MD    Family History Family History  Problem Relation Age of Onset  . Arthritis Mother   . Asthma Mother   . Depression Mother   . Hyperlipidemia Mother   . Hypertension Mother   . Kidney disease Mother   . Cancer Father   . Diabetes Father   . Early death Father   . Hyperlipidemia Father   . Hypertension Father   . Heart failure Father   . Asthma Sister   . Hypertension Sister   . Miscarriages / Stillbirths Sister   . Alcohol abuse Brother   . Hypertension Brother   . Osteoarthritis Maternal Grandmother   . Asthma Maternal Grandmother   . Cancer Maternal Grandmother   . COPD Maternal Grandmother   . Depression Maternal Grandmother   . Early death Maternal Grandmother   . Osteoarthritis Maternal Grandfather   . Asthma Maternal Grandfather   . COPD Maternal Grandfather   . Hypertension Maternal Grandfather   . Hyperlipidemia Maternal Grandfather   . Cancer Paternal Grandmother   . Diabetes Paternal Grandmother   . Diabetes Paternal Grandfather   . Hyperlipidemia Paternal Grandfather   . Heart failure Paternal Grandfather   . Kidney disease Paternal Grandfather   . Hyperlipidemia Other   . Heart failure Other   . Kidney disease Other     Social History Social History   Tobacco Use  . Smoking status: Never Smoker  . Smokeless tobacco: Never Used  Substance Use Topics  . Alcohol use: Yes    Comment: 1 drink every two weeks   . Drug use: Never     Allergies   Clarithromycin   Review of Systems Review of Systems  Constitutional: Positive for fever. Negative for chills.  HENT: Positive for congestion, postnasal drip, rhinorrhea and sinus pressure. Negative for ear pain and sore throat.   Eyes: Negative for pain and visual disturbance.  Respiratory: Positive for cough. Negative for shortness of breath.   Cardiovascular: Negative for chest pain and palpitations.  Gastrointestinal: Negative for abdominal pain, diarrhea and vomiting.  Genitourinary:  Negative for dysuria and hematuria.  Musculoskeletal: Negative for arthralgias and back pain.  Skin: Negative for color change and rash.  Neurological: Negative for seizures and syncope.  All other systems reviewed and are negative.    Physical Exam Triage Vital Signs ED Triage Vitals  Enc Vitals Group     BP      Pulse      Resp      Temp      Temp src      SpO2      Weight      Height      Head Circumference      Peak Flow      Pain Score      Pain Loc      Pain Edu?      Excl. in [place]?    No data found.  Updated Vital Signs BP 125/74   Pulse Marland Kitchen)  102   Temp 98.9 F (37.2 C) (Oral)   Resp 18   LMP 02/07/2021 Comment: states she has taken two negative preg tests   SpO2 95%   Visual Acuity Right Eye Distance:   Left Eye Distance:   Bilateral Distance:    Right Eye Near:   Left Eye Near:    Bilateral Near:     Physical Exam Vitals and nursing note reviewed.  Constitutional:      General: She is not in acute distress.    Appearance: She is well-developed. She is obese. She is not ill-appearing.  HENT:     Head: Normocephalic and atraumatic.     Right Ear: Tympanic membrane normal.     Left Ear: Tympanic membrane normal.     Nose: Nose normal.     Mouth/Throat:     Mouth: Mucous membranes are moist.     Pharynx: Oropharynx is clear.  Eyes:     Conjunctiva/sclera: Conjunctivae normal.  Cardiovascular:     Rate and Rhythm: Normal rate and regular rhythm.     Heart sounds: Normal heart sounds.  Pulmonary:     Effort: Pulmonary effort is normal. No respiratory distress.     Breath sounds: Normal breath sounds. No wheezing.  Abdominal:     Palpations: Abdomen is soft.     Tenderness: There is no abdominal tenderness.  Musculoskeletal:     Cervical back: Neck supple.  Skin:    General: Skin is warm and dry.  Neurological:     General: No focal deficit present.     Mental Status: She is alert and oriented to person, place, and time.     Gait: Gait  normal.  Psychiatric:        Mood and Affect: Mood normal.        Behavior: Behavior normal.      UC Treatments / Results  Labs (all labs ordered are listed, but only abnormal results are displayed) Labs Reviewed - No data to display  EKG   Radiology No results found.  Procedures Procedures (including critical care time)  Medications Ordered in UC Medications - No data to display  Initial Impression / Assessment and Plan / UC Course  I have reviewed the triage vital signs and the nursing notes.  Pertinent labs & imaging results that were available during my care of the patient were reviewed by me and considered in my medical decision making (see chart for details).   Viral URI with cough.  Treating cough with Tessalon Perles.  PCR COVID pending.  Directed patient to quarantine until the test result is back.  Discussed other symptomatic treatment including Tylenol or ibuprofen as needed for fever and discomfort; plain over-the-counter Mucinex as needed for congestion; rest and hydration.  Instructed her to follow-up with her PCP if her symptoms are not improving.  She agrees to plan of care.   Final Clinical Impressions(s) / UC Diagnoses   Final diagnoses:  Viral URI with cough     Discharge Instructions     Your COVID test is pending.  You should self quarantine until the test result is back.    Take the [facility] as needed for cough.  Take Tylenol or ibuprofen as needed for fever or discomfort; plain over-the-counter Mucinex as needed for congestion.  Rest and keep yourself hydrated.    Follow-up with your primary care provider if your symptoms are not improving.          ED Prescriptions  Medication Sig Dispense Auth. Provider   benzonatate (TESSALON) 100 MG capsule Take 1 capsule (100 mg total) by mouth 3 (three) times daily as needed for cough. 21 capsule Sharion Balloon, NP     PDMP not reviewed this encounter.   Sharion Balloon, NP 03/27/21  1231

## 2021-03-28 ENCOUNTER — Other Ambulatory Visit: Payer: Self-pay | Admitting: Primary Care

## 2021-03-28 ENCOUNTER — Ambulatory Visit: Payer: BC Managed Care – PPO | Admitting: Primary Care

## 2021-03-28 DIAGNOSIS — F411 Generalized anxiety disorder: Secondary | ICD-10-CM

## 2021-03-28 LAB — NOVEL CORONAVIRUS, NAA: SARS-CoV-2, NAA: NOT DETECTED

## 2021-03-28 LAB — SARS-COV-2, NAA 2 DAY TAT

## 2021-03-30 ENCOUNTER — Other Ambulatory Visit: Payer: Self-pay

## 2021-03-30 ENCOUNTER — Ambulatory Visit
Admission: EM | Admit: 2021-03-30 | Discharge: 2021-03-30 | Disposition: A | Discharge status: Home / Self Care | Payer: BC Managed Care – PPO | Attending: Sports Medicine | Admitting: Sports Medicine

## 2021-03-30 ENCOUNTER — Encounter: Payer: Self-pay | Admitting: Emergency Medicine

## 2021-03-30 DIAGNOSIS — J069 Acute upper respiratory infection, unspecified: Secondary | ICD-10-CM

## 2021-03-30 DIAGNOSIS — R0981 Nasal congestion: Secondary | ICD-10-CM

## 2021-03-30 DIAGNOSIS — R059 Cough, unspecified: Secondary | ICD-10-CM

## 2021-03-30 DIAGNOSIS — R062 Wheezing: Secondary | ICD-10-CM | POA: Diagnosis not present

## 2021-03-30 DIAGNOSIS — R0989 Other specified symptoms and signs involving the circulatory and respiratory systems: Secondary | ICD-10-CM

## 2021-03-30 DIAGNOSIS — J209 Acute bronchitis, unspecified: Secondary | ICD-10-CM | POA: Diagnosis not present

## 2021-03-30 MED ORDER — PREDNISONE 20 MG PO TABS: 20.0000 mg | ORAL_TABLET | Freq: Two times a day (BID) | ORAL | 0 refills | Status: DC

## 2021-03-30 MED ORDER — AZITHROMYCIN 250 MG PO TABS: 250.0000 mg | ORAL_TABLET | Freq: Every day | ORAL | 0 refills | Status: DC

## 2021-03-30 MED ORDER — ALBUTEROL SULFATE HFA 108 (90 BASE) MCG/ACT IN AERS: 1.0000 | INHALATION_SPRAY | Freq: Four times a day (QID) | RESPIRATORY_TRACT | 0 refills | Status: DC | PRN

## 2021-03-30 NOTE — Discharge Instructions (Signed)
Please see educational handouts 

## 2021-03-30 NOTE — ED Provider Notes (Signed)
[facility]    CSN: [id number] Arrival date & time: 03/30/21  1301      History   Chief Complaint Chief Complaint  Patient presents with  . Cough    HPI Kelli Cox is a 45 y.o. female.   Patient is a pleasant 45 year old female who presents for evaluation of the above issues.  Normally followed by Alma Friendly at [place] and [place].  Her provider was not available today so she comes to the urgent care.  She does not work outside of the home.  I reviewed her chart in detail it appears as though she has been seen either in person or through an E-visit on several occasions throughout the last month.  It appears as though her symptoms resolved but then they came back again.  She reports now about 7 to 10 days of cough, nasal congestion, chest tightness, and wheeze.  She does have a history of asthma and takes albuterol and Advair.  No history of COPD.  No smoking history as well.  No sore throat or ear pain.  She denies any significant chest pain or shortness of breath.  No abdominal or urinary symptoms.  She does report a fever of 100.6 this morning.  She says she took Tylenol several hours prior to arrival.  She is currently afebrile.  She was given a prescription for Tessalon Perles 3 days ago for her cough.  She has been vaccinated against COVID as well as influenza.  No known COVID exposure.  No red flag signs or symptoms elicited on history.     Past Medical History:  Diagnosis Date  . Asthma   . Chicken pox   . GAD (generalized anxiety disorder)   . Migraines   . Type 2 diabetes mellitus [facility])     Patient Active Problem List   Diagnosis Date Noted  . Pain of upper abdomen 01/23/2021  . Acute non-recurrent sinusitis 10/17/2020  . Diabetes mellitus without complication ([place]) 73/22/0254  . Migraines 10/01/2020  . Acute right-sided low back pain with right-sided sciatica 10/01/2020  . Asthma 10/01/2020  . GAD (generalized anxiety disorder) 10/01/2020     Past Surgical History:  Procedure Laterality Date  . CHOLECYSTECTOMY  2008  . LIPOMA RESECTION  2012  . WRIST SURGERY Left    2001 and 2007    OB History   No obstetric history on file.      Home Medications    Prior to Admission medications   Medication Sig Start Date End Date Taking? Authorizing Provider  azithromycin (ZITHROMAX) 250 MG tablet Take 1 tablet (250 mg total) by mouth daily. Take first 2 tablets together, then 1 every day until finished. 03/30/21  Yes Verda Cumins, MD  predniSONE (DELTASONE) 20 MG tablet Take 1 tablet (20 mg total) by mouth 2 (two) times daily with a meal. 03/30/21  Yes Verda Cumins, MD  albuterol (PROVENTIL) (2.5 MG/3ML) 0.083% nebulizer solution Take 3 mLs (2.5 mg total) by nebulization every 6 (six) hours as needed for wheezing or shortness of breath. 03/12/21   Pleas Koch, NP  albuterol (VENTOLIN HFA) 108 (90 Base) MCG/ACT inhaler Inhale 1-2 puffs into the lungs every 6 (six) hours as needed for wheezing or shortness of breath. 03/30/21   Verda Cumins, MD  Ascorbic Acid (VITAMIN C) 500 MG CAPS Take by mouth.    [provider]  benzonatate (TESSALON) 100 MG capsule Take 1 capsule (100 mg total) by mouth 3 (three) times daily as  needed for cough. 03/27/21   Sharion Balloon, NP  butalbital-aspirin-caffeine [facility]) [phone] MG capsule Take by mouth. 12/15/18   [provider]  cetirizine (ZYRTEC) 10 MG tablet Take 1 tablet (10 mg total) by mouth daily. 10/10/20   Sharion Balloon, FNP  cyclobenzaprine (FLEXERIL) 10 MG tablet Take 0.5-1 tablets (5-10 mg total) by mouth at bedtime as needed for muscle spasms. 02/25/21 02/25/22  Copland, Frederico Hamman, MD  diclofenac (VOLTAREN) 75 MG EC tablet Take 1 tablet (75 mg total) by mouth 2 (two) times daily. 02/25/21   Copland, Frederico Hamman, MD  Fluticasone-Salmeterol (ADVAIR) 100-50 MCG/DOSE AEPB Inhale into the lungs. 06/21/20   [provider]  hydrOXYzine (ATARAX/VISTARIL) 10 MG  tablet Take 1-2 tablets (10-20 mg total) by mouth 2 (two) times daily as needed for anxiety. 02/22/21   Pleas Koch, NP  metFORMIN (GLUCOPHAGE) 1000 MG tablet Take 1,000 mg by mouth daily. 01/30/21   [provider]  ondansetron (ZOFRAN ODT) 4 MG disintegrating tablet Take 1 tablet (4 mg total) by mouth every 8 (eight) hours as needed for nausea or vomiting. 03/20/21   Brunetta Jeans, PA-C  sertraline (ZOLOFT) 100 MG tablet Take 1 tablet (100 mg total) by mouth daily. For anxiety 01/02/21   Pleas Koch, NP  vitamin E 180 MG (400 UNITS) capsule Take 400 Units by mouth daily.    [provider]    Family History Family History  Problem Relation Age of Onset  . Arthritis Mother   . Asthma Mother   . Depression Mother   . Hyperlipidemia Mother   . Hypertension Mother   . Kidney disease Mother   . Cancer Father   . Diabetes Father   . Early death Father   . Hyperlipidemia Father   . Hypertension Father   . Heart failure Father   . Asthma Sister   . Hypertension Sister   . Miscarriages / Stillbirths Sister   . Alcohol abuse Brother   . Hypertension Brother   . Osteoarthritis Maternal Grandmother   . Asthma Maternal Grandmother   . Cancer Maternal Grandmother   . COPD Maternal Grandmother   . Depression Maternal Grandmother   . Early death Maternal Grandmother   . Osteoarthritis Maternal Grandfather   . Asthma Maternal Grandfather   . COPD Maternal Grandfather   . Hypertension Maternal Grandfather   . Hyperlipidemia Maternal Grandfather   . Cancer Paternal Grandmother   . Diabetes Paternal Grandmother   . Diabetes Paternal Grandfather   . Hyperlipidemia Paternal Grandfather   . Heart failure Paternal Grandfather   . Kidney disease Paternal Grandfather   . Hyperlipidemia Other   . Heart failure Other   . Kidney disease Other     Social History Social History   Tobacco Use  . Smoking status: Never Smoker  . Smokeless tobacco: Never Used   Vaping Use  . Vaping Use: Never used  Substance Use Topics  . Alcohol use: Yes    Comment: 1 drink every two weeks   . Drug use: Never     Allergies   Clarithromycin   Review of Systems Review of Systems  Constitutional: Positive for activity change and fever. Negative for appetite change, chills and diaphoresis.  HENT: Positive for congestion, postnasal drip and rhinorrhea. Negative for ear pain, sinus pressure, sinus pain, sneezing and sore throat.   Eyes: Negative.  Negative for pain.  Respiratory: Positive for cough, chest tightness and wheezing. Negative for shortness of breath and stridor.  Cardiovascular: Negative for chest pain and palpitations.  Gastrointestinal: Negative.  Negative for abdominal pain, constipation, diarrhea, nausea and vomiting.  Genitourinary: Negative for dysuria, flank pain, frequency, hematuria and urgency.  Musculoskeletal: Negative.  Negative for arthralgias, back pain, myalgias, neck pain and neck stiffness.  Skin: Negative.  Negative for color change, pallor, rash and wound.  Neurological: Negative for dizziness, seizures, weakness, light-headedness and headaches.  All other systems reviewed and are negative.    Physical Exam Triage Vital Signs ED Triage Vitals  Enc Vitals Group     BP 03/30/21 1322 (!) 129/96     Pulse Rate 03/30/21 1322 (!) 107     Resp 03/30/21 1322 16     Temp 03/30/21 1322 98.6 F (37 C)     Temp Source 03/30/21 1322 Oral     SpO2 03/30/21 1322 98 %     Weight 03/30/21 1320 270 lb 8.1 oz (122.7 kg)     Height 03/30/21 1320 5\' 5"  (1.651 m)     Head Circumference --      Peak Flow --      Pain Score 03/30/21 1320 7     Pain Loc --      Pain Edu? --      Excl. in [place]? --    No data found.  Updated Vital Signs BP (!) 129/96 (BP Location: Left Arm)   Pulse (!) 107   Temp 98.6 F (37 C) (Oral)   Resp 16   Ht 5\' 5"  (1.651 m)   Wt 122.7 kg   LMP 03/09/2021 (Approximate)   SpO2 98%   BMI 45.01 kg/m    Visual Acuity Right Eye Distance:   Left Eye Distance:   Bilateral Distance:    Right Eye Near:   Left Eye Near:    Bilateral Near:     Physical Exam Vitals and nursing note reviewed.  Constitutional:      General: She is not in acute distress.    Appearance: Normal appearance. She is not ill-appearing, toxic-appearing or diaphoretic.  HENT:     Head: Normocephalic and atraumatic.     Right Ear: Tympanic membrane normal.     Left Ear: Tympanic membrane normal.     Nose: Congestion present.     Mouth/Throat:     Mouth: Mucous membranes are moist.     Pharynx: Oropharynx is clear. Uvula midline. No pharyngeal swelling, oropharyngeal exudate, posterior oropharyngeal erythema or uvula swelling.     Tonsils: No tonsillar exudate or tonsillar abscesses.  Eyes:     General: No scleral icterus.       Right eye: No discharge.        Left eye: No discharge.     Extraocular Movements: Extraocular movements intact.     Conjunctiva/sclera: Conjunctivae normal.     Pupils: Pupils are equal, round, and reactive to light.  Cardiovascular:     Rate and Rhythm: Normal rate and regular rhythm.     Pulses: Normal pulses.     Heart sounds: Normal heart sounds. No murmur heard. No friction rub. No gallop.   Pulmonary:     Effort: Pulmonary effort is normal. No tachypnea, accessory muscle usage or respiratory distress.     Breath sounds: No stridor or decreased air movement. Examination of the right-upper field reveals wheezing. Examination of the left-upper field reveals wheezing. Examination of the right-middle field reveals wheezing. Examination of the left-middle field reveals wheezing. Examination of the right-lower field reveals wheezing. Examination of the  left-lower field reveals wheezing. Wheezing present. No decreased breath sounds, rhonchi or rales.  Musculoskeletal:     Cervical back: Normal range of motion and neck supple. No rigidity or tenderness.  Lymphadenopathy:     Cervical:  Cervical adenopathy present.  Skin:    General: Skin is warm and dry.     Capillary Refill: Capillary refill takes less than 2 seconds.     Findings: No bruising, erythema, lesion or rash.  Neurological:     General: No focal deficit present.     Mental Status: She is alert and oriented to person, place, and time.      UC Treatments / Results  Labs (all labs ordered are listed, but only abnormal results are displayed) Labs Reviewed - No data to display  EKG   Radiology No results found.  Procedures Procedures (including critical care time)  Medications Ordered in UC Medications - No data to display  Initial Impression / Assessment and Plan / UC Course  I have reviewed the triage vital signs and the nursing notes.  Pertinent labs & imaging results that were available during my care of the patient were reviewed by me and considered in my medical decision making (see chart for details).  Clinical impression: Cough with nasal congestion and chest congestion with associated wheezing.  Clinically has an acute bronchitis with an upper respiratory infection.  She also has a history of asthma.  We will treat accordingly.  Treatment plan: 1.  The findings and treatment plan were discussed in detail with the patient.  Patient was in agreement. 2.  Recommended getting a COVID test but the patient deferred. 3.  Given her wheezing and her history of asthma we will treat with a Z-Pak, prednisone, and renew her albuterol inhaler. 4.  Educational handouts provided. 5.  Plenty of rest, plenty of fluids, Tylenol or Motrin for any fever or discomfort.  Over-the-counter meds including cough syrup.  Have also recommended Mucinex 1200 mg twice a day to thin the secretions. 6.  Patient does have Tessalon Perles. 7.  No work note was needed. 8.  If symptoms persist she should contact her primary care office.  If they worsen she should go to the ER. 9.  Discharge from care at this time and she  will follow-up with [place] as needed.  She was stable on discharge.     Final Clinical Impressions(s) / UC Diagnoses   Final diagnoses:  Acute bronchitis, unspecified organism  Upper respiratory tract infection, unspecified type  Cough  Wheezing  Nasal congestion  Chest congestion     Discharge Instructions     Please see educational handouts     ED Prescriptions    Medication Sig Dispense Auth. Provider   albuterol (VENTOLIN HFA) 108 (90 Base) MCG/ACT inhaler Inhale 1-2 puffs into the lungs every 6 (six) hours as needed for wheezing or shortness of breath. 1 each Verda Cumins, MD   azithromycin (ZITHROMAX) 250 MG tablet Take 1 tablet (250 mg total) by mouth daily. Take first 2 tablets together, then 1 every day until finished. 6 tablet Verda Cumins, MD   predniSONE (DELTASONE) 20 MG tablet Take 1 tablet (20 mg total) by mouth 2 (two) times daily with a meal. 10 tablet Verda Cumins, MD     PDMP not reviewed this encounter.   Verda Cumins, MD 04/01/21 [phone]

## 2021-03-30 NOTE — ED Triage Notes (Signed)
Patient c/o cough and chest congestion and wheezing that started a week ago.  Patient reports temp of 101.6 this morning.  Patient states that she took Tylenol 9am this morning.  Patient was seen on 03/27/21 given Tessalon Perles for cough.

## 2021-04-01 ENCOUNTER — Ambulatory Visit: Payer: BC Managed Care – PPO | Admitting: Primary Care

## 2021-04-22 ENCOUNTER — Other Ambulatory Visit: Payer: Self-pay | Admitting: Family Medicine

## 2021-04-22 NOTE — Telephone Encounter (Signed)
Last office visit 02/25/2021 with Dr. Lorelei Pont for strain of right trapezius muscle.  Last refilled 02/25/2021 for #60 with 1 refill.  Next Appt: 05/10/2021 with Gentry Fitz for 6 month follow up.  Ok to refill?

## 2021-04-23 ENCOUNTER — Ambulatory Visit: Payer: BC Managed Care – PPO | Admitting: Primary Care

## 2021-04-25 ENCOUNTER — Ambulatory Visit: Admit: 2021-04-25 | Discharge status: Absent without leave | Payer: BC Managed Care – PPO

## 2021-05-10 ENCOUNTER — Ambulatory Visit: Payer: 59 | Admitting: Primary Care

## 2021-05-10 NOTE — Telephone Encounter (Signed)
Noted, patient rescheduled for 06/23.

## 2021-05-23 ENCOUNTER — Other Ambulatory Visit: Payer: Self-pay

## 2021-05-23 ENCOUNTER — Encounter: Payer: Self-pay | Admitting: Primary Care

## 2021-05-23 ENCOUNTER — Ambulatory Visit: Payer: 59 | Admitting: Primary Care

## 2021-05-23 VITALS — BP 126/84 | HR 95 | Temp 97.7°F | Ht 65.0 in | Wt 273.0 lb

## 2021-05-23 DIAGNOSIS — E119 Type 2 diabetes mellitus without complications: Secondary | ICD-10-CM

## 2021-05-23 DIAGNOSIS — G43709 Chronic migraine without aura, not intractable, without status migrainosus: Secondary | ICD-10-CM

## 2021-05-23 DIAGNOSIS — F411 Generalized anxiety disorder: Secondary | ICD-10-CM | POA: Diagnosis not present

## 2021-05-23 DIAGNOSIS — Z114 Encounter for screening for human immunodeficiency virus [HIV]: Secondary | ICD-10-CM

## 2021-05-23 DIAGNOSIS — Z1211 Encounter for screening for malignant neoplasm of colon: Secondary | ICD-10-CM

## 2021-05-23 DIAGNOSIS — Z1159 Encounter for screening for other viral diseases: Secondary | ICD-10-CM

## 2021-05-23 LAB — LIPID PANEL
Cholesterol: 138 mg/dL (ref 0–200)
HDL: 35.9 mg/dL — ABNORMAL LOW (ref >39.00)
LDL Cholesterol: 71 mg/dL (ref 0–99)
NonHDL: 102.48
Total CHOL/HDL Ratio: 4
Triglycerides: 158 mg/dL — ABNORMAL HIGH (ref 0.0–149.0)
VLDL: 31.6 mg/dL (ref 0.0–40.0)

## 2021-05-23 LAB — POCT GLYCOSYLATED HEMOGLOBIN (HGB A1C): Hemoglobin A1C: 7 % — AB (ref 4.0–5.6)

## 2021-05-23 LAB — BASIC METABOLIC PANEL
BUN: 9 mg/dL (ref 6–23)
CO2: 28 mEq/L (ref 19–32)
Calcium: 8.9 mg/dL (ref 8.4–10.5)
Chloride: 102 mEq/L (ref 96–112)
Creatinine, Ser: 0.72 mg/dL (ref 0.40–1.20)
GFR: 101.27 mL/min (ref >60.00)
Glucose, Bld: 211 mg/dL — ABNORMAL HIGH (ref 70–99)
Potassium: 4.3 mEq/L (ref 3.5–5.1)
Sodium: 137 mEq/L (ref 135–145)

## 2021-05-23 LAB — MICROALBUMIN / CREATININE URINE RATIO
Creatinine,U: 122.4 mg/dL
Microalb Creat Ratio: 0.6 mg/g (ref 0.0–30.0)
Microalb, Ur: <0.7 mg/dL (ref 0.0–1.9)

## 2021-05-23 MED ORDER — BUTALBITAL-ASPIRIN-CAFFEINE 50-325-40 MG PO CAPS: 1.0000 | ORAL_CAPSULE | Freq: Four times a day (QID) | ORAL | 0 refills | Status: DC | PRN

## 2021-05-23 MED ORDER — SERTRALINE HCL 50 MG PO TABS: 50.0000 mg | ORAL_TABLET | Freq: Every day | ORAL | 3 refills | Status: DC

## 2021-05-23 MED ORDER — METFORMIN HCL 1000 MG PO TABS
1000.0000 mg | ORAL_TABLET | Freq: Two times a day (BID) | ORAL | 3 refills | Status: DC
Start: 2021-05-23 — End: 2022-05-21

## 2021-05-23 NOTE — Progress Notes (Signed)
Subjective:    Patient ID: Kelli Cox, female    DOB: 11/20/1976, 45 y.o.   MRN: [id number]  HPI  Lawrencia Mauney is a very pleasant 45 y.o. female with a history of asthma, migraines, type 2 diabetes, GAD who presents today for follow up of diabetes and anxiety.  1) Type 2 Diabetes:   Current medications include: Metformin 1000 mg BID, is only taking once daily.   She is checking her blood glucose 1 times daily and is getting readings of 120's-130's  Last A1C: 7.5 in February 2022, 7.0 today  Last Eye Exam: Due Last Foot Exam:  UTD Pneumonia Vaccination: 2018 Urine Microalbumin:  Due Statin: none  Dietary changes since last visit: None.   Exercise: Under desk elliptical daily.    2) GAD: Chronic, improved since switching her job in April 2022. She is now back down to 50 mg of sertraline for which she reduced 2 weeks ago, doing well on this regimen. Infrequent use of hydroxyzine, this has been effective for acute anxiety. No longer taking lorazepam.     Review of Systems  Eyes:  Negative for visual disturbance.  Respiratory:  Negative for shortness of breath.   Cardiovascular:  Negative for chest pain.  Neurological:  Positive for headaches. Negative for numbness.  Psychiatric/Behavioral:  The patient is not nervous/anxious.         Past Medical History:  Diagnosis Date   Acute non-recurrent sinusitis 10/17/2020   Asthma    Chicken pox    GAD (generalized anxiety disorder)    Migraines    Pain of upper abdomen 01/23/2021   Type 2 diabetes mellitus ([place])     Social History   Socioeconomic History   Marital status: Single    Spouse name: Not on file   Number of children: Not on file   Years of education: Not on file   Highest education level: Master's degree (e.g., MA, MS, MEng, MEd, MSW, MBA)  Occupational History   Occupation: Government social research officer  Tobacco Use   Smoking status: Never   Smokeless tobacco: Never  Vaping Use   Vaping Use: Never used  Substance  and Sexual Activity   Alcohol use: Yes    Comment: 1 drink every two weeks    Drug use: Never   Sexual activity: Yes    Partners: Male  Other Topics Concern   Not on file  Social History Narrative   Not on file   Social Determinants of Health   Financial Resource Strain: Not on file  Food Insecurity: Not on file  Transportation Needs: Not on file  Physical Activity: Not on file  Stress: Not on file  Social Connections: Not on file  Intimate Partner Violence: Not on file    Past Surgical History:  Procedure Laterality Date   CHOLECYSTECTOMY  2008   [place]  2012   WRIST SURGERY Left    2001 and 2007    Family History  Problem Relation Age of Onset   Arthritis Mother    Asthma Mother    Depression Mother    Hyperlipidemia Mother    Hypertension Mother    Kidney disease Mother    Cancer Father    Diabetes Father    Early death Father    Hyperlipidemia Father    Hypertension Father    Heart failure Father    Asthma Sister    Hypertension Sister    Miscarriages / Stillbirths Sister    Alcohol abuse Brother  Hypertension Brother    Osteoarthritis Maternal Grandmother    Asthma Maternal Grandmother    Cancer Maternal Grandmother    COPD Maternal Grandmother    Depression Maternal Grandmother    Early death Maternal Grandmother    Osteoarthritis Maternal Grandfather    Asthma Maternal Grandfather    COPD Maternal Grandfather    Hypertension Maternal Grandfather    Hyperlipidemia Maternal Grandfather    Cancer Paternal Grandmother    Diabetes Paternal Grandmother    Diabetes Paternal Grandfather    Hyperlipidemia Paternal Grandfather    Heart failure Paternal Grandfather    Kidney disease Paternal Grandfather    Hyperlipidemia Other    Heart failure Other    Kidney disease Other     Allergies  Allergen Reactions   Clarithromycin Nausea And Vomiting    Current Outpatient Medications on File Prior to Visit  Medication Sig Dispense Refill    albuterol (PROVENTIL) (2.5 MG/3ML) 0.083% nebulizer solution Take 3 mLs (2.5 mg total) by nebulization every 6 (six) hours as needed for wheezing or shortness of breath. 75 mL 0   albuterol (VENTOLIN HFA) 108 (90 Base) MCG/ACT inhaler Inhale 1-2 puffs into the lungs every 6 (six) hours as needed for wheezing or shortness of breath. 1 each 0   Ascorbic Acid (VITAMIN C) 500 MG CAPS Take by mouth.     cetirizine (ZYRTEC) 10 MG tablet Take 1 tablet (10 mg total) by mouth daily. 30 tablet 11   Fluticasone-Salmeterol (ADVAIR) 100-50 MCG/DOSE AEPB Inhale into the lungs.     hydrOXYzine (ATARAX/VISTARIL) 10 MG tablet Take 1-2 tablets (10-20 mg total) by mouth 2 (two) times daily as needed for anxiety. 60 tablet 0   vitamin E 180 MG (400 UNITS) capsule Take 400 Units by mouth daily.     No current facility-administered medications on file prior to visit.    BP 126/84   Pulse 95   Temp 97.7 F (36.5 C) (Temporal)   Ht 5\' 5"  (1.651 m)   Wt 273 lb (123.8 kg)   SpO2 98%   BMI 45.43 kg/m  Objective:   Physical Exam Cardiovascular:     Rate and Rhythm: Normal rate and regular rhythm.  Pulmonary:     Effort: Pulmonary effort is normal.     Breath sounds: Normal breath sounds.  Musculoskeletal:     Cervical back: Neck supple.  Skin:    General: Skin is warm and dry.  Psychiatric:        Mood and Affect: Mood normal.          Assessment & Plan:      This visit occurred during the SARS-CoV-2 public health emergency.  Safety protocols were in place, including screening questions prior to the visit, additional usage of staff PPE, and extensive cleaning of exam room while observing appropriate contact time as indicated for disinfecting solutions.

## 2021-05-23 NOTE — Assessment & Plan Note (Signed)
Improved and controlled at 7.0, however is only taking metformin 1000 mg BID. New Rx for metformin sent to pharmacy.   Foot exam UTD. She will schedule eye exam. Urine micro due and pending. Pneumonia vaccine UTD.  Follow up in 6 months.

## 2021-05-23 NOTE — Patient Instructions (Addendum)
Stop by the lab prior to leaving today. I will notify you of your results once received.   You will be contacted regarding your referral to GI for the colonoscopy.  Please let us know if you have not been contacted within two weeks.

## 2021-05-23 NOTE — Assessment & Plan Note (Signed)
Overall stable. Refill provided for Fiorinal.

## 2021-05-23 NOTE — Assessment & Plan Note (Signed)
Improved since her job change in April 2022. Doing well on reduced dose of sertraline at 50 mg, refills provided for this dose.   Continue PRN hydroxyzine 10 mg.

## 2021-05-24 LAB — HEPATITIS C ANTIBODY
Hepatitis C Ab: NONREACTIVE
SIGNAL TO CUT-OFF: 0.01 (ref <1.00)

## 2021-05-24 LAB — HIV ANTIBODY (ROUTINE TESTING W REFLEX): HIV 1&2 Ab, 4th Generation: NONREACTIVE

## 2021-05-29 ENCOUNTER — Encounter: Payer: Self-pay | Admitting: Family Medicine

## 2021-05-29 ENCOUNTER — Ambulatory Visit (INDEPENDENT_AMBULATORY_CARE_PROVIDER_SITE_OTHER): Payer: 59 | Admitting: Family Medicine

## 2021-05-29 ENCOUNTER — Other Ambulatory Visit: Payer: Self-pay

## 2021-05-29 ENCOUNTER — Other Ambulatory Visit (HOSPITAL_COMMUNITY)
Admission: RE | Admit: 2021-05-29 | Discharge: 2021-05-29 | Disposition: A | Discharge status: Home / Self Care | Payer: 59 | Source: Ambulatory Visit | Attending: Family Medicine | Admitting: Family Medicine

## 2021-05-29 VITALS — BP 158/102 | HR 86 | Ht 65.0 in | Wt 276.0 lb

## 2021-05-29 DIAGNOSIS — Z01419 Encounter for gynecological examination (general) (routine) without abnormal findings: Secondary | ICD-10-CM | POA: Insufficient documentation

## 2021-05-29 DIAGNOSIS — E119 Type 2 diabetes mellitus without complications: Secondary | ICD-10-CM | POA: Diagnosis not present

## 2021-05-29 DIAGNOSIS — Z803 Family history of malignant neoplasm of breast: Secondary | ICD-10-CM

## 2021-05-29 DIAGNOSIS — Z319 Encounter for procreative management, unspecified: Secondary | ICD-10-CM

## 2021-05-29 DIAGNOSIS — Z8041 Family history of malignant neoplasm of ovary: Secondary | ICD-10-CM

## 2021-05-29 NOTE — Progress Notes (Signed)
GYNECOLOGY ANNUAL PREVENTATIVE CARE ENCOUNTER NOTE  Subjective:   Kelli Cox is a 45 y.o. G63P0010 female here for a routine annual gynecologic exam.  Current complaints: Desires pregnancy, had miscarriage last month after trying to conceive for 1 month.  Understands her barriers to fertility. She is losing weight and has lost 30 lbs. She has T2Dm but is well controlled (last HA1c 7.0). She has a monthly cycle, last < 7 days.   Denies abnormal vaginal bleeding, discharge, pelvic pain, problems with intercourse or other gynecologic concerns.    Gynecologic History Patient's last menstrual period was 05/24/2021 (exact date). Contraception: none Last Pap: Needs today- no history of abnormal pap Last mammogram: 2022. Results were: normal  Health Maintenance Due  Topic Date Due   PAP SMEAR-Modifier  Never done   OPHTHALMOLOGY EXAM  03/08/2021   COLONOSCOPY (Pts 45-59yrs Insurance coverage will need to be confirmed)  Never done    The following portions of the patient's history were reviewed and updated as appropriate: allergies, current medications, past family history, past medical history, past social history, past surgical history and problem list.  Review of Systems Pertinent items are noted in HPI.   Objective:  BP (!) 152/97   Pulse 91   Ht 5\' 5"  (1.651 m)   Wt 276 lb (125.2 kg)   LMP 05/24/2021 (Exact Date)   BMI 45.93 kg/m  CONSTITUTIONAL: Well-developed, well-nourished female in no acute distress.  HENT:  Normocephalic, atraumatic, External right and left ear normal. Oropharynx is clear and moist EYES:  No scleral icterus.  NECK: Normal range of motion, supple, no masses.  Normal thyroid.  SKIN: Skin is warm and dry. No rash noted. Not diaphoretic. No erythema. No pallor. NEUROLOGIC: Alert and oriented to person, place, and time. Normal reflexes, muscle tone coordination. No cranial nerve deficit noted. PSYCHIATRIC: Normal mood and affect. Normal behavior. Normal  judgment and thought content. CARDIOVASCULAR: Normal heart rate noted, regular rhythm. 2+ distal pulses. RESPIRATORY: Effort and breath sounds normal, no problems with respiration noted. BREASTS: Symmetric in size. No masses, skin changes, nipple drainage, or lymphadenopathy. ABDOMEN: Soft,  no distention noted.  No tenderness, rebound or guarding.  PELVIC: Normal appearing external genitalia; normal appearing vaginal mucosa and cervix.  No abnormal discharge noted.  Pap smear obtained.  Normal uterine size, no other palpable masses, no uterine or adnexal tenderness. MUSCULOSKELETAL: Normal range of motion.    Assessment and Plan:  1) Annual gynecologic examination with pap smear:  Will follow up results of pap smear and manage accordingly.  Routine preventative health maintenance measures emphasized.  1. Diabetes mellitus without complication (HCC) Well controlled   2. Patient desires pregnancy Patient had early pregnancy loss last month Reviewed high risk status and barriers to fertility being - T2DM-on metformin, weight and age. She plans to start trying again. She is tracking her cycle and has her period monthly. She and partner had just started trying when she became pregnant last month.  Discussed referral to infertility and desires this today She will continue to try to conceive Recommended she take prenatal vitamin daily - Ambulatory referral to Infertility  3. Family history of breast cancer - Genetic Screening  4. Family history of ovarian cancer - Genetic Screening  5. Well woman exam with routine gynecological exam   Please refer to After Visit Summary for other counseling recommendations.   Return in about 1 year (around 05/29/2022) for Yearly wellness exam.  Caren Macadam, MD, MPH, ABFM Attending Physician  [facility]

## 2021-05-29 NOTE — Addendum Note (Signed)
Addended by: Crosby Oyster on: 05/29/2021 02:30 PM   Modules accepted: Orders

## 2021-05-31 ENCOUNTER — Telehealth (INDEPENDENT_AMBULATORY_CARE_PROVIDER_SITE_OTHER): Payer: 59 | Admitting: Gastroenterology

## 2021-05-31 DIAGNOSIS — Z1211 Encounter for screening for malignant neoplasm of colon: Secondary | ICD-10-CM

## 2021-05-31 LAB — CYTOLOGY - PAP
Comment: NEGATIVE
Diagnosis: UNDETERMINED — AB
High risk HPV: NEGATIVE

## 2021-05-31 MED ORDER — PEG 3350-KCL-NA BICARB-NACL 420 G PO SOLR: 4000.0000 mL | Freq: Once | ORAL | 0 refills | Status: AC

## 2021-05-31 NOTE — Progress Notes (Signed)
Gastroenterology Pre-Procedure Review  Request Date: 06/21/21 Requesting Physician: Dr. Vicente Males  PATIENT REVIEW QUESTIONS: The patient responded to the following health history questions as indicated:    1. Are you having any GI issues? no 2. Do you have a personal history of Polyps? no 3. Do you have a family history of Colon Cancer or Polyps? no 4. Diabetes Mellitus? no 5. Joint replacements in the past 12 months?no 6. Major health problems in the past 3 months?no 7. Any artificial heart valves, MVP, or defibrillator?no    MEDICATIONS & ALLERGIES:    Patient reports the following regarding taking any anticoagulation/antiplatelet therapy:   Plavix, Coumadin, Eliquis, Xarelto, Lovenox, Pradaxa, Brilinta, or Effient? no Aspirin? no  Patient confirms/reports the following medications:  Current Outpatient Medications  Medication Sig Dispense Refill   polyethylene glycol-electrolytes (NULYTELY) 420 g solution Take 4,000 mLs by mouth once for 1 dose. 4000 mL 0   albuterol (PROVENTIL) (2.5 MG/3ML) 0.083% nebulizer solution Take 3 mLs (2.5 mg total) by nebulization every 6 (six) hours as needed for wheezing or shortness of breath. 75 mL 0   albuterol (VENTOLIN HFA) 108 (90 Base) MCG/ACT inhaler Inhale 1-2 puffs into the lungs every 6 (six) hours as needed for wheezing or shortness of breath. 1 each 0   Ascorbic Acid (VITAMIN C) 500 MG CAPS Take by mouth.     butalbital-aspirin-caffeine (FIORINAL) 50-325-40 MG capsule Take 1 capsule by mouth every 6 (six) hours as needed for headache. For headache/migraines 20 capsule 0   cetirizine (ZYRTEC) 10 MG tablet Take 1 tablet (10 mg total) by mouth daily. 30 tablet 11   Fluticasone-Salmeterol (ADVAIR) 100-50 MCG/DOSE AEPB Inhale into the lungs.     hydrOXYzine (ATARAX/VISTARIL) 10 MG tablet Take 1-2 tablets (10-20 mg total) by mouth 2 (two) times daily as needed for anxiety. 60 tablet 0   metFORMIN (GLUCOPHAGE) 1000 MG tablet Take 1 tablet (1,000 mg  total) by mouth 2 (two) times daily with a meal. For diabetes. 180 tablet 3   Multiple Vitamins-Minerals (MULTIVITAMIN WITH MINERALS) tablet Take 1 tablet by mouth daily.     sertraline (ZOLOFT) 50 MG tablet Take 1 tablet (50 mg total) by mouth daily. For anxiety. 90 tablet 3   vitamin E 180 MG (400 UNITS) capsule Take 400 Units by mouth daily.     No current facility-administered medications for this visit.    Patient confirms/reports the following allergies:  Allergies  Allergen Reactions   Clarithromycin Nausea And Vomiting    No orders of the defined types were placed in this encounter.   AUTHORIZATION INFORMATION Primary Insurance: 1D#: Group #:  Secondary Insurance: 1D#: Group #:  SCHEDULE INFORMATION: Date: 06/21/21 Time: Location: [place]

## 2021-06-04 MED ORDER — PEG 3350-KCL-NA BICARB-NACL 420 G PO SOLR: ORAL | 0 refills | Status: DC

## 2021-06-10 ENCOUNTER — Encounter: Payer: Self-pay | Admitting: Radiology

## 2021-06-10 ENCOUNTER — Telehealth: Payer: Self-pay | Admitting: Radiology

## 2021-06-10 NOTE — Telephone Encounter (Signed)
Called and left message for patient on voicemail with [facility] hereditary screening results

## 2021-06-14 ENCOUNTER — Telehealth: Payer: Self-pay

## 2021-06-14 NOTE — Telephone Encounter (Signed)
Patient was switched to 07/05/2021 per her request.

## 2021-06-14 NOTE — Telephone Encounter (Signed)
Called patient but had to leave her a detailed message letting her know that we will need to reschedule her procedure with Dr. Vicente Males for another date since he will not be able to do it on 06/21/2021.

## 2021-06-17 ENCOUNTER — Encounter: Payer: Self-pay | Admitting: Emergency Medicine

## 2021-06-17 ENCOUNTER — Ambulatory Visit: Payer: 59

## 2021-06-17 ENCOUNTER — Ambulatory Visit
Admission: EM | Admit: 2021-06-17 | Discharge: 2021-06-17 | Disposition: A | Discharge status: Home / Self Care | Payer: 59 | Attending: Emergency Medicine | Admitting: Emergency Medicine

## 2021-06-17 ENCOUNTER — Other Ambulatory Visit: Payer: Self-pay

## 2021-06-17 DIAGNOSIS — S8991XA Unspecified injury of right lower leg, initial encounter: Secondary | ICD-10-CM

## 2021-06-17 MED ORDER — NAPROXEN 500 MG PO TABS: 500.0000 mg | ORAL_TABLET | Freq: Two times a day (BID) | ORAL | 0 refills | Status: AC

## 2021-06-17 NOTE — ED Provider Notes (Signed)
Chief Complaint   Chief Complaint  Patient presents with   Knee Pain    right     Subjective, HPI  Kelli Cox is a 45 y.o. female who presents with right knee pain after twisting the area 3 days ago while playing putt putt golf.  Patient states that she has had pain ever since.  She reports that the right knee has "buckled" a few times.  She also reports that the area feels stiff at times.  Patient reports that she has used ice and elevation along with a compression sleeve, but states that the sleeve hurt to wear it.  She also reports that she ordered a right knee brace which will be at her home tomorrow.  Patient states that she has not tried using an Ace wrap.  Patient reports that the pain is all around her right knee but mostly to the lateral aspect.  She does not report any trauma or falls precipitating current symptoms and is not concerned at this time for fracture.  History obtained from patient.  Patient's problem list, past medical and social history, medications, and allergies were reviewed by me and updated in Epic.    ROS  See HPI.  Objective   Vitals:   06/17/21 1513 06/17/21 1516  BP: 120/87   Pulse:  92  Resp: 18   Temp: 99.4 F (37.4 C)   SpO2: 97%     Vital signs and nursing note reviewed.   General: Appears well-developed and well-nourished. No acute distress.  Head: Normocephalic and atraumatic.   Neck: Normal range of motion, neck is supple.  Cardiovascular: Normal rate. Pulm/Chest: No respiratory distress.  Musculoskeletal: Right knee: Moderate TTP noted to lateral joint line with TTP to superior aspect of right knee and somewhat to medial aspect of right knee.  There is no TTP over patella.  No laxity noted to patella.  Patient is ambulating with a limping gait.  5/5 strength, full sensation, 2+  pulses, < 2 sec cap refill.  Neurological: Alert and oriented to person, place, and time.  Skin: Skin is warm and dry.   Psychiatric: Normal mood, affect,  behavior, and thought content.    Data  No results found for any visits on 06/17/21.   Assessment & Plan  1. Injury of right knee, initial encounter   Meds ordered this encounter  Medications   naproxen (NAPROSYN) 500 MG tablet    Sig: Take 1 tablet (500 mg total) by mouth 2 (two) times daily for 10 days.    Dispense:  20 tablet    Refill:  0    Order Specific Question:   Supervising Provider    Answer:   Chase Picket [[id number]]     45 y.o. female presents with right knee pain after twisting the area 3 days ago while playing putt putt golf.  Patient states that she has had pain ever since.  She reports that the right knee has "buckled" a few times.  She also reports that the area feels stiff at times.  Patient reports that she has used ice and elevation along with a compression sleeve, but states that the sleeve hurt to wear it.  She also reports that she ordered a right knee brace which will be at her home tomorrow.  Patient states that she has not tried using an Ace wrap.  Patient reports that the pain is all around her right knee but mostly to the lateral aspect.  She does not report  any trauma or falls precipitating current symptoms and is not concerned at this time for fracture.  Given symptoms along with assessment findings, likely LCL sprain.  Patient placed in Ace wrap in clinic today and advised about home treatment and care to include rehabilitation exercises.  Did advise that she needs to follow-up with her PCP or orthopedics if she does not obtain relief after about 2 or 3 weeks.  Rx naproxen to the patient's preferred pharmacy and patient states that she has tolerated this medication well in the past.  Return as needed.  Stable on discharge.  Patient verbalized understanding and agreed with plan.  Plan:   Discharge Instructions      Follow up with PCP to inform of visit and treatment in clinic today as orthopedic or PT referral may be needed if symptoms persist longer than  2-3 weeks.  Increase fluid intake. Apply ice to affected area 3-5 times daily for 15-20 minute intervals. Take all medications as prescribed. Pillow underneath knees at night to sleep on your back and/or pillow in between knees to sleep on side. Return to clinic if symptoms worsen. If you experience shortness of breath, chest pain, dizziness, fainting or severe headache go to the ER.         Serafina Royals, FNP-C 06/17/21   This note was partially made with the aid of speech-to-text dictation; typographical errors are not intentional.    Serafina Royals, [place] 06/17/21 1539

## 2021-06-17 NOTE — ED Triage Notes (Signed)
Pt presents today with c/o of pain to right knee. She reports knee buckled approx 3 days ago and she has had pain since. She is ambulatory with a limp.  Pt made aware before arrival that Imaging is not available during todays visit, she verbalized understanding.

## 2021-06-17 NOTE — Discharge Instructions (Signed)
Follow up with PCP to inform of visit and treatment in clinic today as orthopedic or PT referral may be needed if symptoms persist longer than 2-3 weeks.  Increase fluid intake. Apply ice to affected area 3-5 times daily for 15-20 minute intervals. Take all medications as prescribed. Pillow underneath knees at night to sleep on your back and/or pillow in between knees to sleep on side. Return to clinic if symptoms worsen. If you experience shortness of breath, chest pain, dizziness, fainting or severe headache go to the ER.

## 2021-07-02 ENCOUNTER — Encounter: Payer: Self-pay | Admitting: Radiology

## 2021-07-05 ENCOUNTER — Encounter: Payer: Self-pay | Admitting: Gastroenterology

## 2021-07-05 ENCOUNTER — Encounter
Admission: RE | Disposition: A | Discharge status: Home / Self Care | Payer: Self-pay | Source: Home / Self Care | Attending: Gastroenterology

## 2021-07-05 ENCOUNTER — Ambulatory Visit: Payer: 59 | Admitting: Certified Registered"

## 2021-07-05 ENCOUNTER — Ambulatory Visit
Admission: RE | Admit: 2021-07-05 | Discharge: 2021-07-05 | Disposition: A | Discharge status: Home / Self Care | Payer: 59 | Attending: Gastroenterology | Admitting: Gastroenterology

## 2021-07-05 DIAGNOSIS — Z881 Allergy status to other antibiotic agents status: Secondary | ICD-10-CM | POA: Insufficient documentation

## 2021-07-05 DIAGNOSIS — Z841 Family history of disorders of kidney and ureter: Secondary | ICD-10-CM | POA: Insufficient documentation

## 2021-07-05 DIAGNOSIS — Z7984 Long term (current) use of oral hypoglycemic drugs: Secondary | ICD-10-CM | POA: Diagnosis not present

## 2021-07-05 DIAGNOSIS — E119 Type 2 diabetes mellitus without complications: Secondary | ICD-10-CM | POA: Insufficient documentation

## 2021-07-05 DIAGNOSIS — Z825 Family history of asthma and other chronic lower respiratory diseases: Secondary | ICD-10-CM | POA: Insufficient documentation

## 2021-07-05 DIAGNOSIS — Z7951 Long term (current) use of inhaled steroids: Secondary | ICD-10-CM | POA: Insufficient documentation

## 2021-07-05 DIAGNOSIS — D125 Benign neoplasm of sigmoid colon: Secondary | ICD-10-CM | POA: Diagnosis not present

## 2021-07-05 DIAGNOSIS — Z833 Family history of diabetes mellitus: Secondary | ICD-10-CM | POA: Insufficient documentation

## 2021-07-05 DIAGNOSIS — Z79899 Other long term (current) drug therapy: Secondary | ICD-10-CM | POA: Insufficient documentation

## 2021-07-05 DIAGNOSIS — Z809 Family history of malignant neoplasm, unspecified: Secondary | ICD-10-CM | POA: Diagnosis not present

## 2021-07-05 DIAGNOSIS — K635 Polyp of colon: Secondary | ICD-10-CM

## 2021-07-05 DIAGNOSIS — Z8249 Family history of ischemic heart disease and other diseases of the circulatory system: Secondary | ICD-10-CM | POA: Insufficient documentation

## 2021-07-05 DIAGNOSIS — Z8349 Family history of other endocrine, nutritional and metabolic diseases: Secondary | ICD-10-CM | POA: Diagnosis not present

## 2021-07-05 DIAGNOSIS — Z1211 Encounter for screening for malignant neoplasm of colon: Secondary | ICD-10-CM | POA: Diagnosis present

## 2021-07-05 DIAGNOSIS — Z8261 Family history of arthritis: Secondary | ICD-10-CM | POA: Diagnosis not present

## 2021-07-05 HISTORY — PX: COLONOSCOPY WITH PROPOFOL: SHX5780

## 2021-07-05 LAB — GLUCOSE, CAPILLARY: Glucose-Capillary: 198 mg/dL — ABNORMAL HIGH (ref 70–99)

## 2021-07-05 LAB — POCT PREGNANCY, URINE: Preg Test, Ur: NEGATIVE

## 2021-07-05 SURGERY — COLONOSCOPY WITH PROPOFOL
Anesthesia: General

## 2021-07-05 MED ORDER — PROPOFOL 10 MG/ML IV BOLUS
INTRAVENOUS | Status: DC | PRN
  Administered 2021-07-05: 100 mg via INTRAVENOUS

## 2021-07-05 MED ORDER — MIDAZOLAM HCL 2 MG/2ML IJ SOLN
INTRAMUSCULAR | Status: AC
  Filled 2021-07-05: qty 2

## 2021-07-05 MED ORDER — SODIUM CHLORIDE 0.9 % IV SOLN: INTRAVENOUS | Status: DC

## 2021-07-05 MED ORDER — PROPOFOL 500 MG/50ML IV EMUL
INTRAVENOUS | Status: DC | PRN
  Administered 2021-07-05: 120 ug/kg/min via INTRAVENOUS

## 2021-07-05 MED ORDER — LIDOCAINE 2% (20 MG/ML) 5 ML SYRINGE
INTRAMUSCULAR | Status: DC | PRN
Start: 2021-07-05 — End: 2021-07-05
  Administered 2021-07-05: 25 mg via INTRAVENOUS

## 2021-07-05 MED ORDER — MIDAZOLAM HCL 5 MG/5ML IJ SOLN
INTRAMUSCULAR | Status: DC | PRN
  Administered 2021-07-05: 2 mg via INTRAVENOUS

## 2021-07-05 NOTE — H&P (Signed)
Jonathon Bellows, MD [address] [place], [place], [place], [address] [address], [place], [place], [place], [address] Phone: [phone]  Fax: [phone]  Primary Care Physician:  Pleas Koch, NP   Pre-Procedure History & Physical: HPI:  Kelli Cox is a 45 y.o. female is here for an colonoscopy.   Past Medical History:  Diagnosis Date   Acute non-recurrent sinusitis 10/17/2020   Asthma    Chicken pox    GAD (generalized anxiety disorder)    Migraines    Pain of upper abdomen 01/23/2021   Type 2 diabetes mellitus [facility])     Past Surgical History:  Procedure Laterality Date   CHOLECYSTECTOMY  2008   LIPOMA RESECTION  2012   WRIST SURGERY Left    2001 and 2007    Prior to Admission medications   Medication Sig Start Date End Date Taking? Authorizing Provider  metFORMIN (GLUCOPHAGE) 1000 MG tablet Take 1 tablet (1,000 mg total) by mouth 2 (two) times daily with a meal. For diabetes. 05/23/21  Yes Pleas Koch, NP  sertraline (ZOLOFT) 50 MG tablet Take 1 tablet (50 mg total) by mouth daily. For anxiety. 05/23/21  Yes Pleas Koch, NP  albuterol (PROVENTIL) (2.5 MG/3ML) 0.083% nebulizer solution Take 3 mLs (2.5 mg total) by nebulization every 6 (six) hours as needed for wheezing or shortness of breath. 03/12/21   Pleas Koch, NP  albuterol (VENTOLIN HFA) 108 (90 Base) MCG/ACT inhaler Inhale 1-2 puffs into the lungs every 6 (six) hours as needed for wheezing or shortness of breath. 03/30/21   Verda Cumins, MD  Ascorbic Acid (VITAMIN C) 500 MG CAPS Take by mouth.    [provider]  butalbital-aspirin-caffeine Acquanetta Chain) 50-325-40 MG capsule Take 1 capsule by mouth every 6 (six) hours as needed for headache. For headache/migraines 05/23/21   Pleas Koch, NP  cetirizine (ZYRTEC) 10 MG tablet Take 1 tablet (10 mg total) by mouth daily. 10/10/20   Sharion Balloon, FNP  Fluticasone-Salmeterol (ADVAIR) 100-50 MCG/DOSE AEPB Inhale into the  lungs. 06/21/20   [provider]  hydrOXYzine (ATARAX/VISTARIL) 10 MG tablet Take 1-2 tablets (10-20 mg total) by mouth 2 (two) times daily as needed for anxiety. 02/22/21   Pleas Koch, NP  Multiple Vitamins-Minerals (MULTIVITAMIN WITH MINERALS) tablet Take 1 tablet by mouth daily.    [provider]  polyethylene glycol-electrolytes (NULYTELY) 420 g solution Prepare according to package instructions. Starting at 5:00 PM: Drink one 8 oz glass of mixture every 15 minutes until you finish half of the jug. Five hours prior to procedure, drink 8 oz glass of mixture every 15 minutes until it is all gone. Make sure you do not drink anything 4 hours prior to your procedure. 06/04/21   Jonathon Bellows, MD  vitamin E 180 MG (400 UNITS) capsule Take 400 Units by mouth daily.    [provider]    Allergies as of 05/31/2021 - Review Complete 05/29/2021  Allergen Reaction Noted   Clarithromycin Nausea And Vomiting 03/04/2017    Family History  Problem Relation Age of Onset   Arthritis Mother    Asthma Mother    Depression Mother    Hyperlipidemia Mother    Hypertension Mother    Kidney disease Mother    Cancer Father    Diabetes Father    Early death Father    Hyperlipidemia Father    Hypertension Father    Heart failure Father    Asthma  Sister    Hypertension Sister    74 / Stillbirths Sister    Alcohol abuse Brother    Hypertension Brother    Osteoarthritis Maternal Grandmother    Asthma Maternal Grandmother    Cancer Maternal Grandmother    COPD Maternal Grandmother    Depression Maternal Grandmother    Early death Maternal Grandmother    Osteoarthritis Maternal Grandfather    Asthma Maternal Grandfather    COPD Maternal Grandfather    Hypertension Maternal Grandfather    Hyperlipidemia Maternal Grandfather    Cancer Paternal Grandmother    Diabetes Paternal Grandmother    Diabetes Paternal Grandfather    Hyperlipidemia Paternal Grandfather     Heart failure Paternal Grandfather    Kidney disease Paternal Grandfather    Hyperlipidemia Other    Heart failure Other    Kidney disease Other     Social History   Socioeconomic History   Marital status: Single    Spouse name: Not on file   Number of children: Not on file   Years of education: Not on file   Highest education level: Master's degree (e.g., MA, MS, MEng, MEd, MSW, MBA)  Occupational History   Occupation: Government social research officer  Tobacco Use   Smoking status: Never   Smokeless tobacco: Never  Vaping Use   Vaping Use: Never used  Substance and Sexual Activity   Alcohol use: Yes    Comment: 1 drink every two weeks    Drug use: Never   Sexual activity: Yes    Partners: Male    Birth control/protection: None  Other Topics Concern   Not on file  Social History Narrative   Not on file   Social Determinants of Health   Financial Resource Strain: Not on file  Food Insecurity: Not on file  Transportation Needs: Not on file  Physical Activity: Not on file  Stress: Not on file  Social Connections: Not on file  Intimate Partner Violence: Not on file    Review of Systems: See HPI, otherwise negative ROS  Physical Exam: BP (!) 142/100   Pulse 97   Temp (!) 97.1 F (36.2 C) (Tympanic)   Resp 18   Ht '5\' 5"'$  (1.651 m)   Wt 122.5 kg   SpO2 96%   BMI 44.93 kg/m  General:   Alert,  pleasant and cooperative in NAD Head:  Normocephalic and atraumatic. Neck:  Supple; no masses or thyromegaly. Lungs:  Clear throughout to auscultation, normal respiratory effort.    Heart:  +S1, +S2, Regular rate and rhythm, No edema. Abdomen:  Soft, nontender and nondistended. Normal bowel sounds, without guarding, and without rebound.   Neurologic:  Alert and  oriented x4;  grossly normal neurologically.  Impression/Plan: Kelli Cox is here for an colonoscopy to be performed for Screening colonoscopy average risk   Risks, benefits, limitations, and alternatives regarding   colonoscopy have been reviewed with the patient.  Questions have been answered.  All parties agreeable.   Jonathon Bellows, MD  07/05/2021, 11:22 AM

## 2021-07-05 NOTE — Anesthesia Preprocedure Evaluation (Signed)
Anesthesia Evaluation  Patient identified by MRN, date of birth, ID band Patient awake    Reviewed: Allergy & Precautions, NPO status , Patient's Chart, lab work & pertinent test results  History of Anesthesia Complications Negative for: history of anesthetic complications  Airway Mallampati: II  TM Distance: >3 FB Neck ROM: Full    Dental no notable dental hx. (+) Teeth Intact   Pulmonary asthma , neg sleep apnea, neg COPD, Patient abstained from smoking.Not current smoker,  Hospitalized 3 years ago for a week (first diagnosis of asthma). Takes rescue inhaler maybe once a month now.   Pulmonary exam normal breath sounds clear to auscultation       Cardiovascular Exercise Tolerance: Good METS(-) hypertension(-) CAD and (-) Past MI (-) dysrhythmias  Rhythm:Regular Rate:Normal - Systolic murmurs    Neuro/Psych  Headaches, PSYCHIATRIC DISORDERS Anxiety    GI/Hepatic neg GERD  ,(+)     (-) substance abuse  ,   Endo/Other  diabetesMorbid obesity  Renal/GU negative Renal ROS     Musculoskeletal   Abdominal (+) + obese,   Peds  Hematology   Anesthesia Other Findings Past Medical History: 10/17/2020: Acute non-recurrent sinusitis No date: Asthma No date: Chicken pox No date: GAD (generalized anxiety disorder) No date: Migraines 01/23/2021: Pain of upper abdomen No date: Type 2 diabetes mellitus (HCC)  Reproductive/Obstetrics                             Anesthesia Physical Anesthesia Plan  ASA: 3  Anesthesia Plan: General   Post-op Pain Management:    Induction: Intravenous  PONV Risk Score and Plan: 3 and Ondansetron, Propofol infusion and TIVA  Airway Management Planned: Nasal Cannula  Additional Equipment: None  Intra-op Plan:   Post-operative Plan:   Informed Consent: I have reviewed the patients History and Physical, chart, labs and discussed the procedure including the  risks, benefits and alternatives for the proposed anesthesia with the patient or authorized representative who has indicated his/her understanding and acceptance.     Dental advisory given  Plan Discussed with: CRNA and Surgeon  Anesthesia Plan Comments: (Discussed risks of anesthesia with patient, including possibility of difficulty with spontaneous ventilation under anesthesia necessitating airway intervention, PONV, and rare risks such as cardiac or respiratory or neurological events, and allergic reactions. Patient understands.)        Anesthesia Quick Evaluation

## 2021-07-05 NOTE — Transfer of Care (Signed)
Immediate Anesthesia Transfer of Care Note  Patient: Kelli Cox  Procedure(s) Performed: COLONOSCOPY WITH PROPOFOL  Patient Location: Endoscopy Unit  Anesthesia Type:General  Level of Consciousness: drowsy  Airway & Oxygen Therapy: Patient Spontanous Breathing  Post-op Assessment: Report given to RN and Post -op Vital signs reviewed and stable  Post vital signs: Reviewed  Last Vitals:  Vitals Value Taken Time  BP    Temp    Pulse    Resp    SpO2      Last Pain:         Complications: No notable events documented.

## 2021-07-05 NOTE — Anesthesia Postprocedure Evaluation (Signed)
Anesthesia Post Note  Patient: Kelli Cox  Procedure(s) Performed: COLONOSCOPY WITH PROPOFOL  Patient location during evaluation: Endoscopy Anesthesia Type: General Level of consciousness: awake and alert Pain management: pain level controlled Vital Signs Assessment: post-procedure vital signs reviewed and stable Respiratory status: spontaneous breathing, nonlabored ventilation, respiratory function stable and patient connected to nasal cannula oxygen Cardiovascular status: blood pressure returned to baseline and stable Postop Assessment: no apparent nausea or vomiting Anesthetic complications: no   No notable events documented.   Last Vitals:  Vitals:   07/05/21 1210 07/05/21 1220  BP: 117/72 131/78  Pulse: 79 72  Resp: 14 16  Temp:    SpO2: 100% 100%    Last Pain:  Vitals:   07/05/21 1220  TempSrc:   PainSc: 0-No pain                 Arita Miss

## 2021-07-05 NOTE — Op Note (Signed)
[facility] Gastroenterology Patient Name: Kelli Cox Procedure Date: 07/05/2021 11:23 AM MRN: [id number] Account #: [id number] Date of Birth: Mar 29, 1976 Admit Type: Outpatient Age: 45 Room: [facility] ENDO ROOM 4 Gender: Female Note Status: Finalized Procedure:             Colonoscopy Indications:           Screening for colorectal malignant neoplasm Providers:             Jonathon Bellows MD, MD Referring MD:          Pleas Koch (Referring MD) Medicines:             Monitored Anesthesia Care Complications:         No immediate complications. Procedure:             Pre-Anesthesia Assessment:                        - Prior to the procedure, a History and Physical was                         performed, and patient medications, allergies and                         sensitivities were reviewed. The patient's tolerance                         of previous anesthesia was reviewed.                        - The risks and benefits of the procedure and the                         sedation options and risks were discussed with the                         patient. All questions were answered and informed                         consent was obtained.                        - ASA Grade Assessment: II - A patient with mild                         systemic disease.                        After obtaining informed consent, the colonoscope was                         passed under direct vision. Throughout the procedure,                         the patient's blood pressure, pulse, and oxygen                         saturations were monitored continuously. The                         Colonoscope was introduced through the anus and  advanced to the the cecum, identified by the                         appendiceal orifice. The colonoscopy was performed                         with ease. The patient tolerated the procedure well.                         The quality of  the bowel preparation was excellent. Findings:      The perianal and digital rectal examinations were normal.      A 5 mm polyp was found in the sigmoid colon. The polyp was sessile. The       polyp was removed with a cold snare. Resection and retrieval were       complete.      The exam was otherwise without abnormality on direct and retroflexion       views. Impression:            - One 5 mm polyp in the sigmoid colon, removed with a                         cold snare. Resected and retrieved.                        - The examination was otherwise normal on direct and                         retroflexion views. Recommendation:        - Discharge patient to home (with escort).                        - Resume previous diet.                        - Continue present medications.                        - Await pathology results.                        - Repeat colonoscopy for surveillance based on                         pathology results. Procedure Code(s):     --- Professional ---                        [phone], Colonoscopy, flexible; with removal of                         tumor(s), polyp(s), or other lesion(s) by snare                         technique Diagnosis Code(s):     --- Professional ---                        Z12.11, Encounter for screening for malignant neoplasm  of colon                        K63.5, Polyp of colon CPT copyright 2019 American Medical Association. All rights reserved. The codes documented in this report are preliminary and upon coder review may  be revised to meet current compliance requirements. Jonathon Bellows, MD Jonathon Bellows MD, MD 07/05/2021 11:49:09 AM This report has been signed electronically. Number of Addenda: 0 Note Initiated On: 07/05/2021 11:23 AM Scope Withdrawal Time: 0 hours 8 minutes 32 seconds  Total Procedure Duration: 0 hours 11 minutes 23 seconds  Estimated Blood Loss:  Estimated blood loss: none.      [facility]

## 2021-07-08 ENCOUNTER — Encounter: Payer: Self-pay | Admitting: Gastroenterology

## 2021-07-09 ENCOUNTER — Encounter: Payer: Self-pay | Admitting: Gastroenterology

## 2021-07-09 LAB — SURGICAL PATHOLOGY

## 2021-07-25 ENCOUNTER — Other Ambulatory Visit: Payer: Self-pay

## 2021-07-25 DIAGNOSIS — F411 Generalized anxiety disorder: Secondary | ICD-10-CM

## 2021-07-26 MED ORDER — HYDROXYZINE HCL 10 MG PO TABS: 10.0000 mg | ORAL_TABLET | Freq: Two times a day (BID) | ORAL | 0 refills | Status: DC | PRN

## 2021-08-25 ENCOUNTER — Emergency Department (HOSPITAL_COMMUNITY)
Admission: EM | Admit: 2021-08-25 | Discharge: 2021-08-25 | Disposition: A | Discharge status: Home / Self Care | Payer: 59 | Attending: Emergency Medicine | Admitting: Emergency Medicine

## 2021-08-25 ENCOUNTER — Other Ambulatory Visit: Payer: Self-pay

## 2021-08-25 ENCOUNTER — Encounter (HOSPITAL_COMMUNITY): Payer: Self-pay

## 2021-08-25 ENCOUNTER — Emergency Department (HOSPITAL_COMMUNITY): Payer: 59

## 2021-08-25 DIAGNOSIS — Z7984 Long term (current) use of oral hypoglycemic drugs: Secondary | ICD-10-CM | POA: Diagnosis not present

## 2021-08-25 DIAGNOSIS — R519 Headache, unspecified: Secondary | ICD-10-CM | POA: Diagnosis not present

## 2021-08-25 DIAGNOSIS — G44319 Acute post-traumatic headache, not intractable: Secondary | ICD-10-CM

## 2021-08-25 DIAGNOSIS — M542 Cervicalgia: Secondary | ICD-10-CM | POA: Insufficient documentation

## 2021-08-25 DIAGNOSIS — W208XXA Other cause of strike by thrown, projected or falling object, initial encounter: Secondary | ICD-10-CM | POA: Insufficient documentation

## 2021-08-25 DIAGNOSIS — Z7952 Long term (current) use of systemic steroids: Secondary | ICD-10-CM | POA: Insufficient documentation

## 2021-08-25 DIAGNOSIS — Z23 Encounter for immunization: Secondary | ICD-10-CM | POA: Diagnosis not present

## 2021-08-25 DIAGNOSIS — S00402A Unspecified superficial injury of left ear, initial encounter: Secondary | ICD-10-CM | POA: Diagnosis present

## 2021-08-25 DIAGNOSIS — S01312A Laceration without foreign body of left ear, initial encounter: Secondary | ICD-10-CM | POA: Diagnosis not present

## 2021-08-25 DIAGNOSIS — J45909 Unspecified asthma, uncomplicated: Secondary | ICD-10-CM | POA: Insufficient documentation

## 2021-08-25 DIAGNOSIS — E119 Type 2 diabetes mellitus without complications: Secondary | ICD-10-CM | POA: Diagnosis not present

## 2021-08-25 IMAGING — CT CT CERVICAL SPINE W/O CM
3 of 4 series · 13 of 33 positions shown, 16 images · non-contrast
Comparison: None.

CLINICAL DATA: Neck trauma, dangerous injury mechanism (Age 16-64y)

EXAM:
CT CERVICAL SPINE WITHOUT CONTRAST
TECHNIQUE: Multidetector CT imaging of the cervical spine was performed without
intravenous contrast. Multiplanar CT image reconstructions were also
generated.

[Series 8: sag bone · sagittal · 0.32mm/px · 5 of 76 slices shown, 6 images]
[im 26/76  bone]
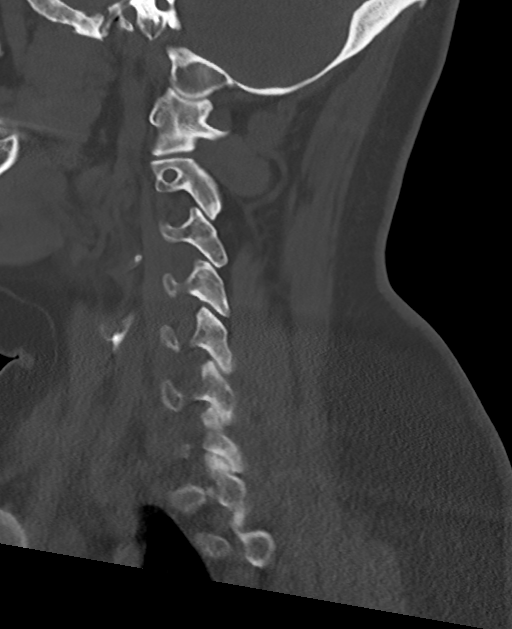
[im 32/76  bone]
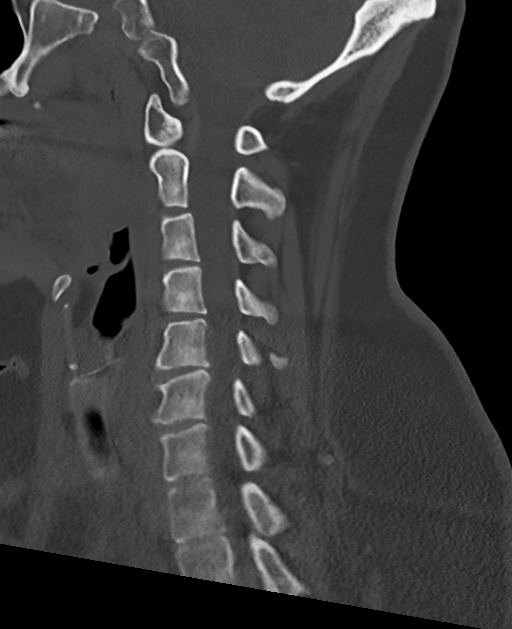
[im 38/76  soft-tissue]
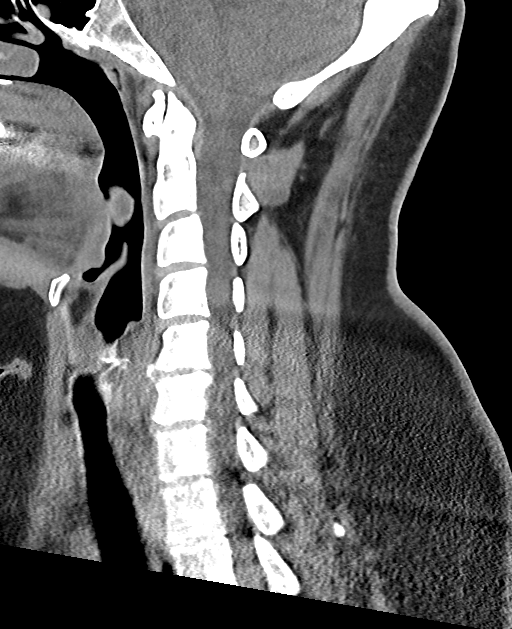
[im 38/76  bone]
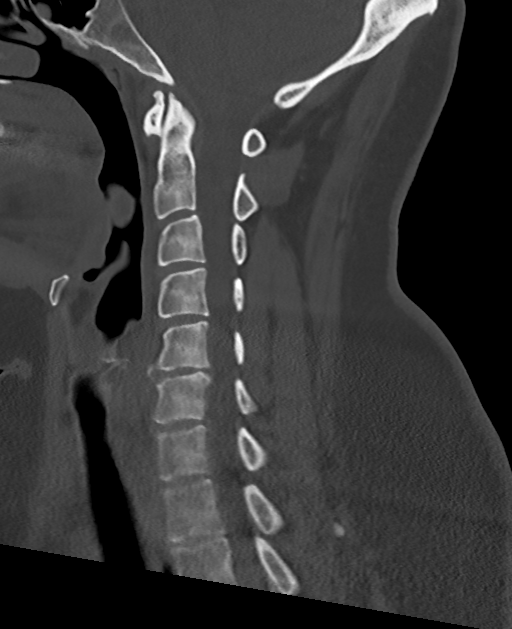
[im 44/76  bone]
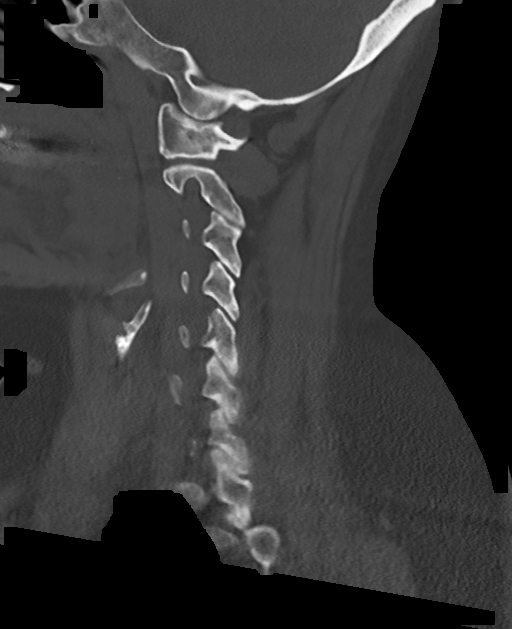
[im 51/76  bone]
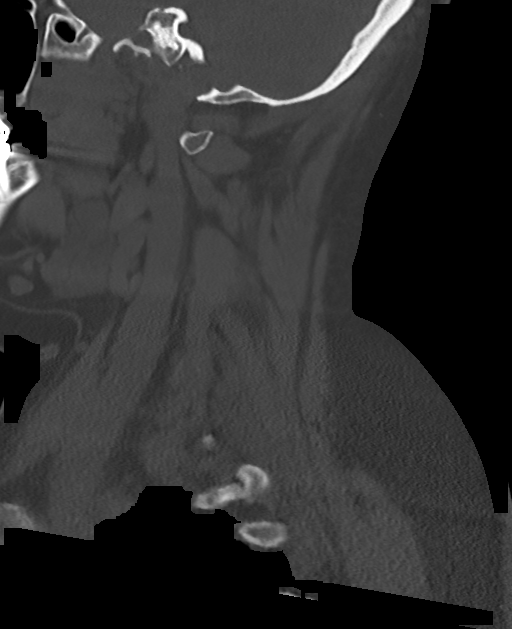

[Series 9: cor bone · coronal · 0.30mm/px · 3 of 90 slices shown]
[im 18/90  bone]
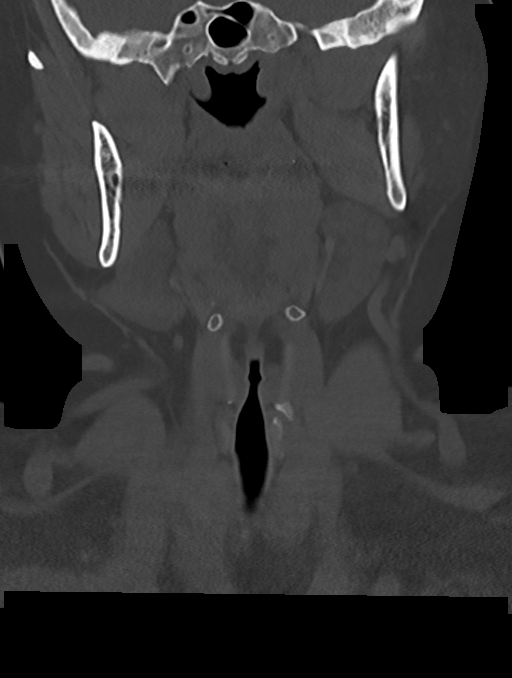
[im 36/90  bone]
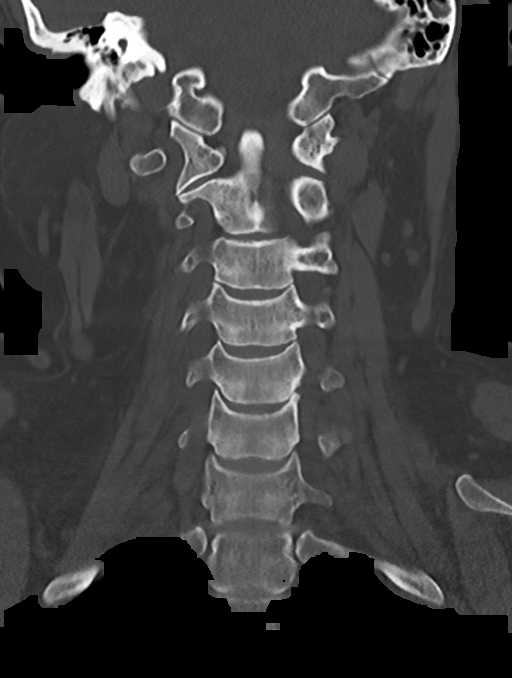
[im 54/90  bone]
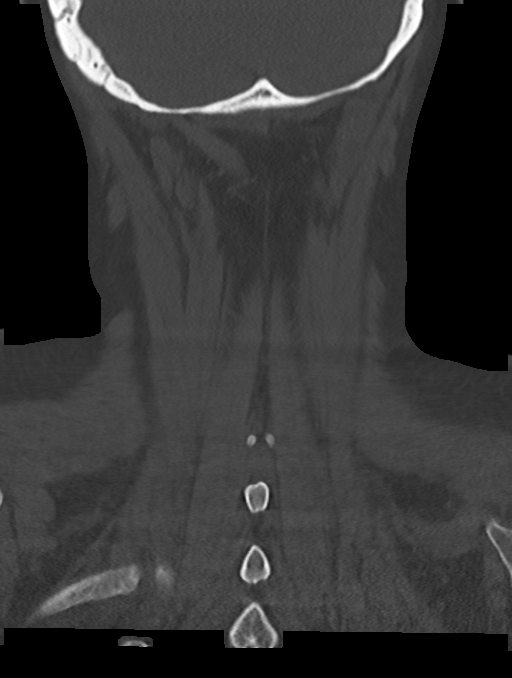

[Series 10: orthogonal axials · axial · 0.21mm/px · z∈[-260,-162]mm · 5 of 88 slices shown, 7 images]
[im 15/88  soft-tissue]
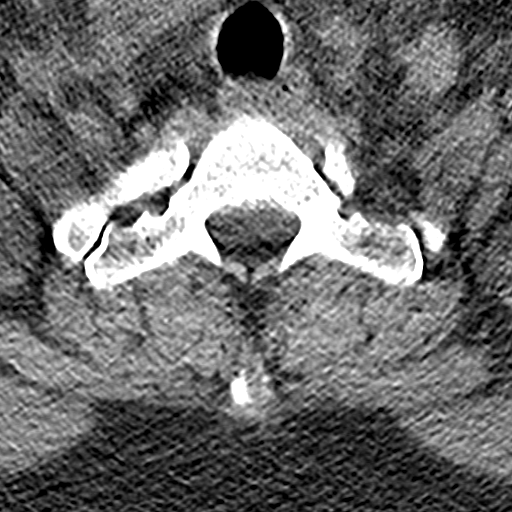
[im 15/88  bone]
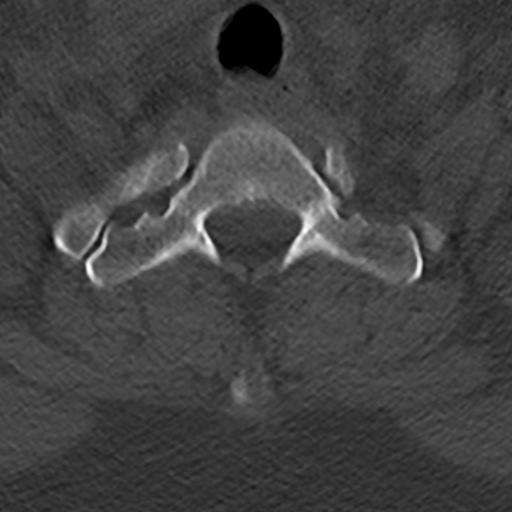
[im 30/88  bone]
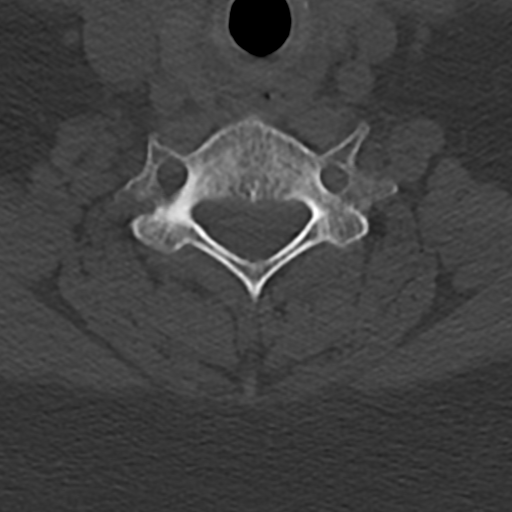
[im 44/88  bone]
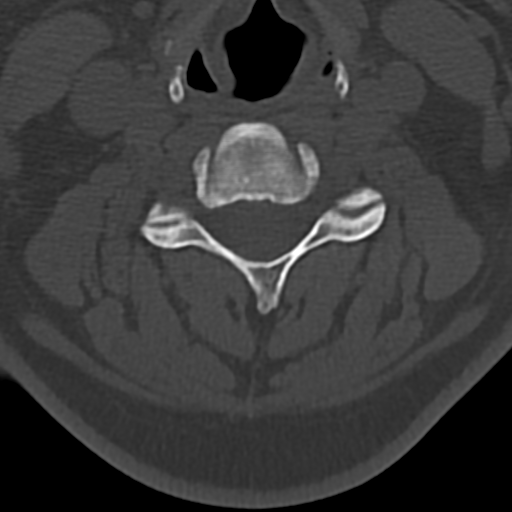
[im 59/88  bone]
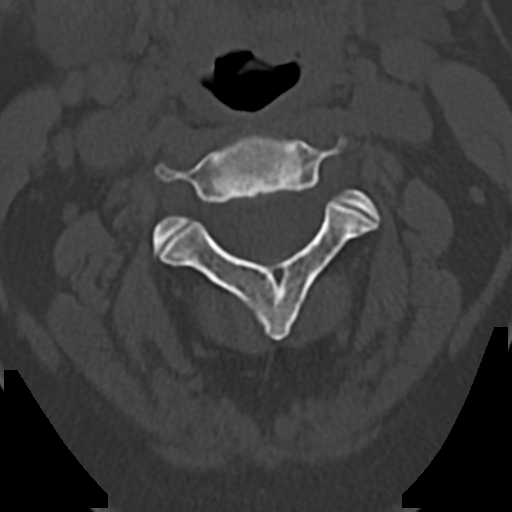
[im 73/88  soft-tissue]
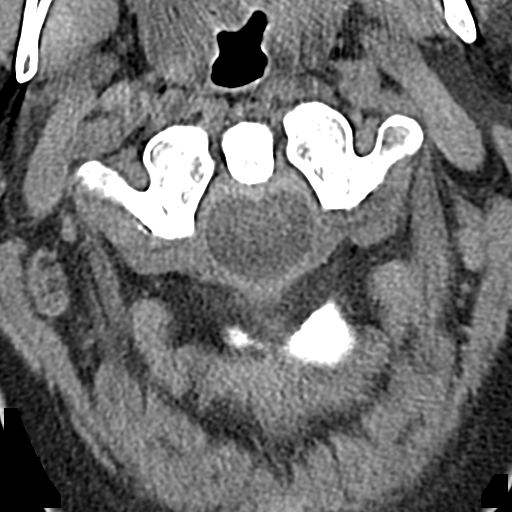
[im 73/88  bone]
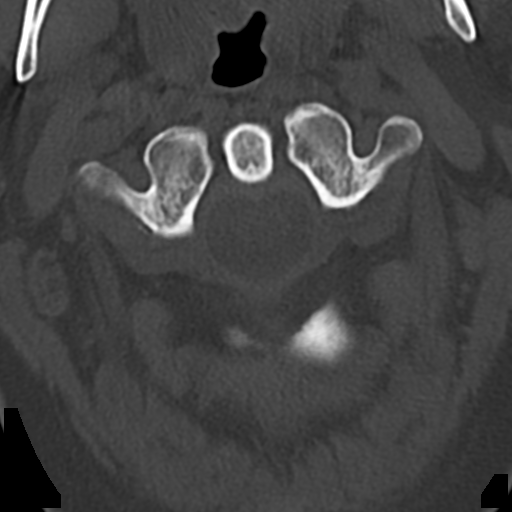

[13 of 33 positions shown; findings below may reference images not displayed]

FINDINGS: Alignment: Normal.

Skull base and vertebrae: No acute fracture. Vertebral body heights
are maintained. The dens and skull base are intact.

Soft tissues and spinal canal: No prevertebral fluid or swelling. No
visible canal hematoma.

Disc levels: Minor endplate spurring at C5-C6 and C6-C7. Disc spaces
are preserved.

Upper chest: No acute or unexpected findings.

Other: None.
IMPRESSION: No fracture or subluxation of the cervical spine.
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
## 2021-08-25 IMAGING — CT CT MAXILLOFACIAL W/O CM
3 of 6 series · 16 of 47 positions shown, 19 images · non-contrast
Comparison: None.

CLINICAL DATA: Facial trauma

Object fell on head.
EXAM:
CT MAXILLOFACIAL WITHOUT CONTRAST
TECHNIQUE: Multidetector CT imaging of the maxillofacial structures was
performed. Multiplanar CT image reconstructions were also generated.

[Series 3: maxilllofacial 2.0 hr40 3 · axial · 0.34mm/px · z∈[-205,-55]mm · 11 of 89 slices shown, 14 images]
[im 7/89  brain]
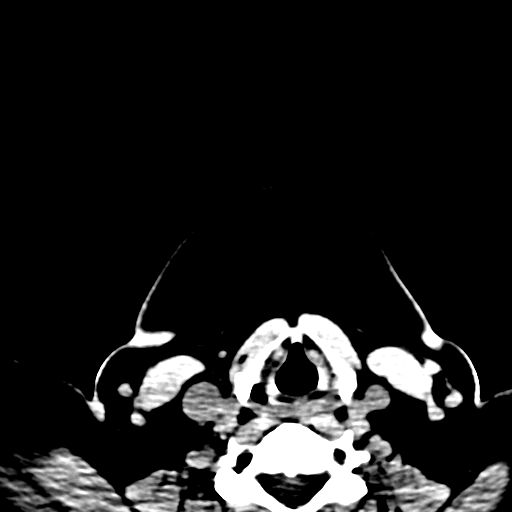
[im 7/89  bone]
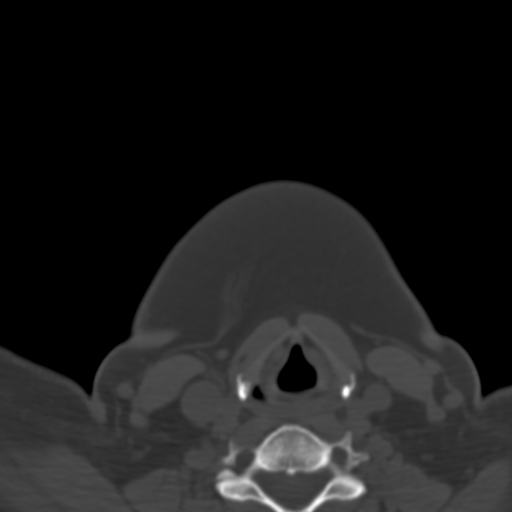
[im 13/89  bone]
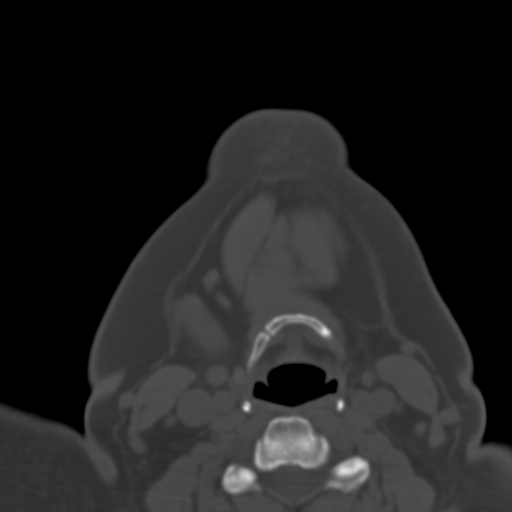
[im 19/89  bone]
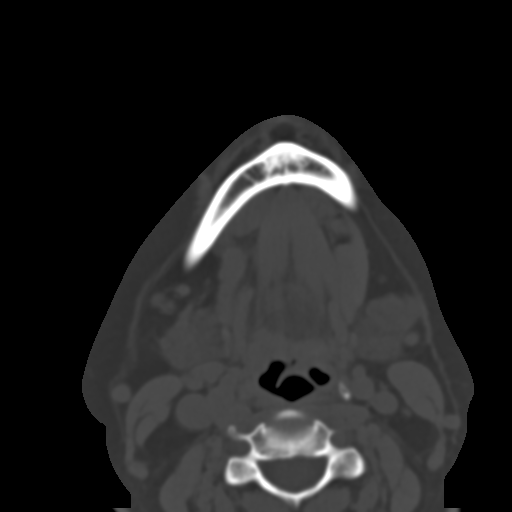
[im 32/89  bone]
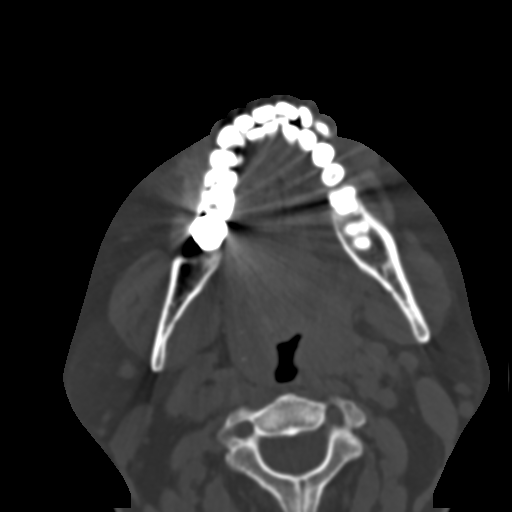
[im 38/89  brain]
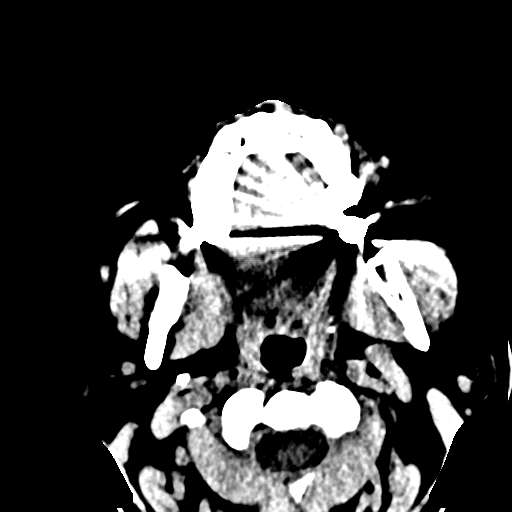
[im 38/89  bone]
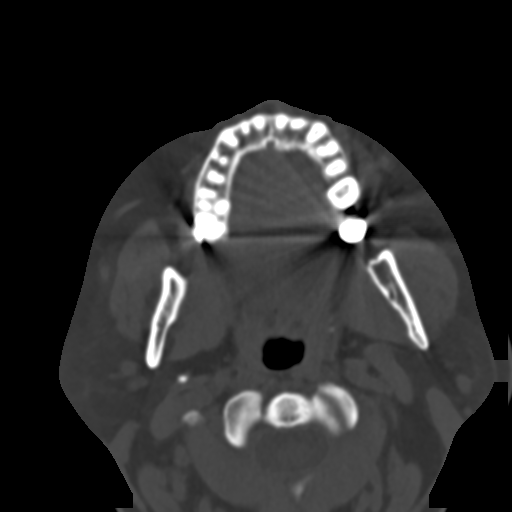
[im 45/89  bone]
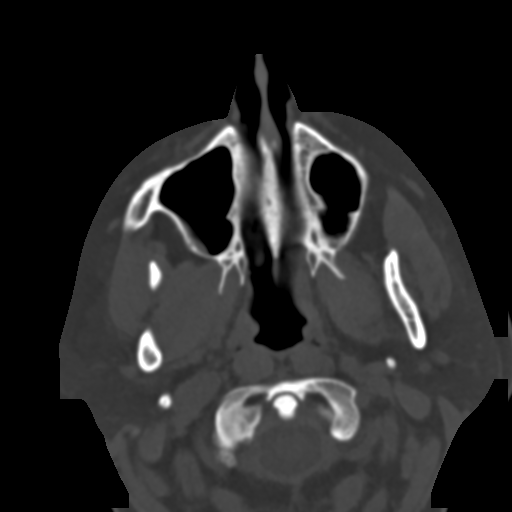
[im 51/89  bone]
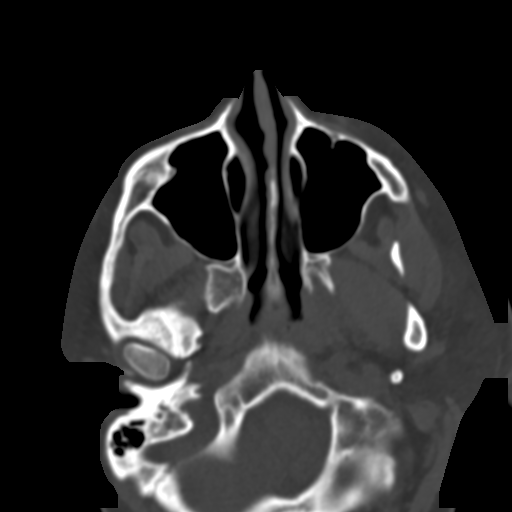
[im 57/89  bone]
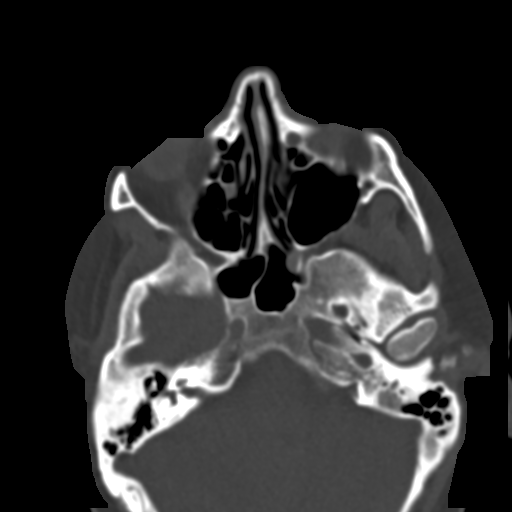
[im 70/89  brain]
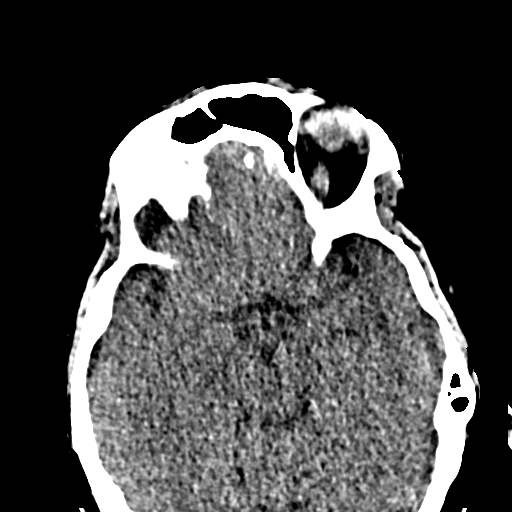
[im 70/89  bone]
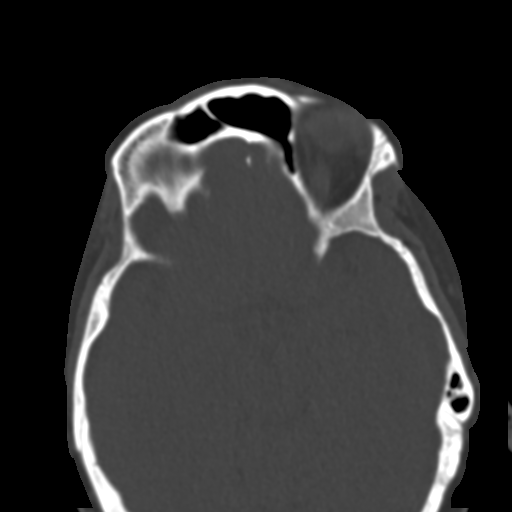
[im 76/89  bone]
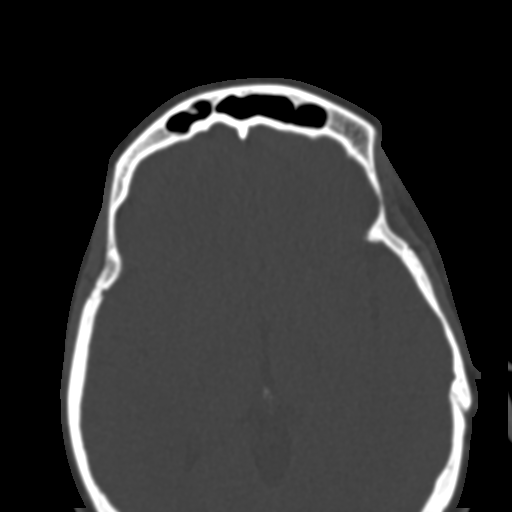
[im 82/89  bone]
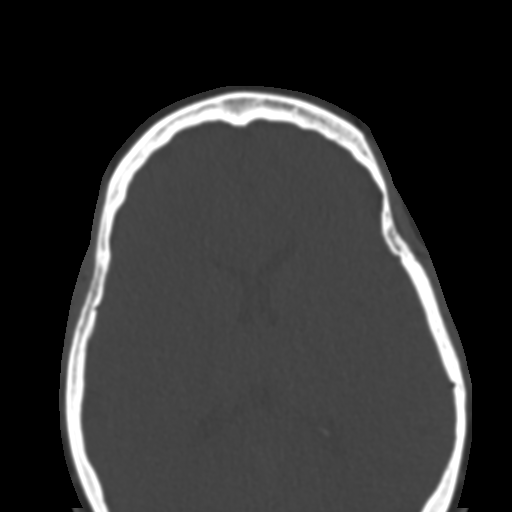

[Series 7: st cor · coronal · 0.35mm/px · 3 of 76 slices shown]
[im 19/76  bone]
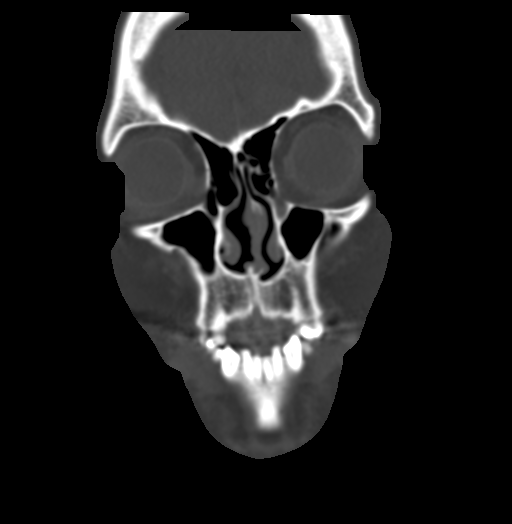
[im 38/76  bone]
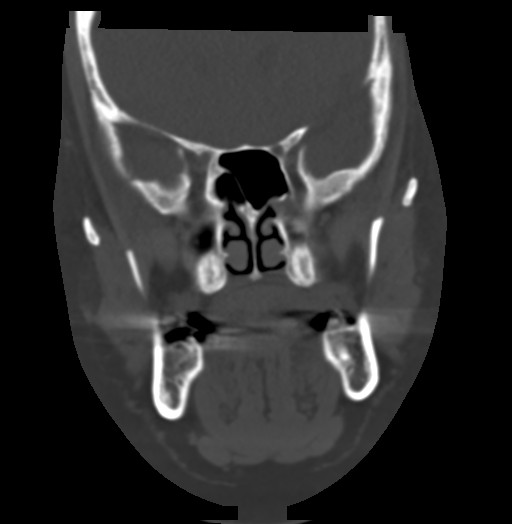
[im 57/76  bone]
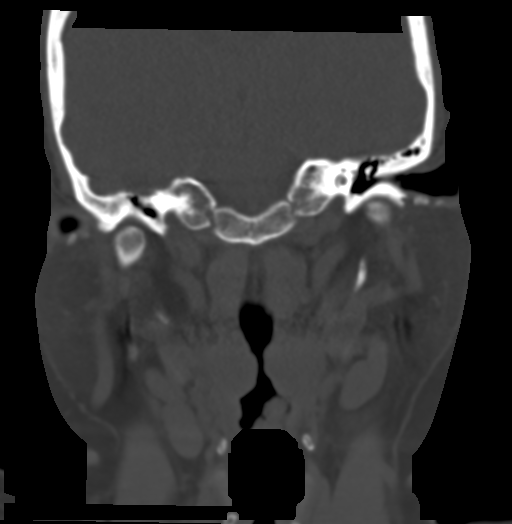

[Series 10: bone sag · sagittal · 0.34mm/px · 2 of 89 slices shown]
[im 30/89  bone]
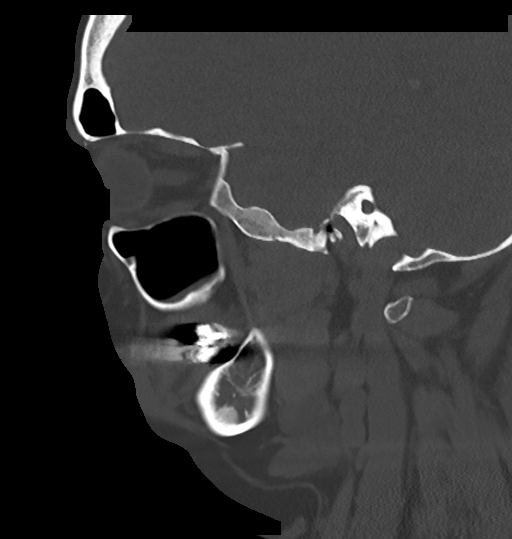
[im 59/89  bone]
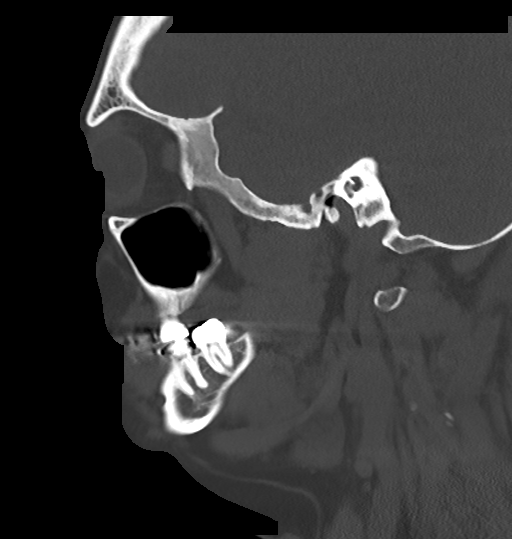

[16 of 47 positions shown; findings below may reference images not displayed]

FINDINGS: Osseous: No acute fracture of the nasal bone, zygomatic arches, or
mandibles. The temporomandibular joints are congruent. There is
leftward nasal septal deviation.

Orbits: No orbital fracture.  No globe injury.

Sinuses: No sinus fracture or fluid level. Tiny mucous retention
cyst in left side of sphenoid sinus. Minimal right maxillary sinus
mucosal thickening. No mastoid effusion.

Soft tissues: No confluent hematoma.

Limited intracranial: Assessed on concurrent head CT, reported
separately.
IMPRESSION: 1. No acute facial bone fracture.
2. Leftward nasal septal deviation.

## 2021-08-25 MED ORDER — ACETAMINOPHEN 325 MG PO TABS: 325.0000 mg | ORAL_TABLET | Freq: Once | ORAL | Status: DC

## 2021-08-25 MED ORDER — ACETAMINOPHEN 500 MG PO TABS
1000.0000 mg | ORAL_TABLET | Freq: Once | ORAL | Status: AC
  Administered 2021-08-25: 1000 mg via ORAL
  Filled 2021-08-25: qty 2

## 2021-08-25 MED ORDER — TETANUS-DIPHTH-ACELL PERTUSSIS 5-2.5-18.5 LF-MCG/0.5 IM SUSY
0.5000 mL | PREFILLED_SYRINGE | Freq: Once | INTRAMUSCULAR | Status: AC
  Administered 2021-08-25: 0.5 mL via INTRAMUSCULAR
  Filled 2021-08-25: qty 0.5

## 2021-08-25 NOTE — Discharge Instructions (Addendum)
You are seen here today for left ear laceration and posttraumatic headache.  See additional information attached to this document for posttraumatic headache care. Tylenol only for pain. Rotate between heat and ice for pain. Included is the number to the concussion clinic for follow up if your symptoms persist. Please return for any new or worsening symptoms.
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
## 2021-08-26 ENCOUNTER — Other Ambulatory Visit: Payer: Self-pay | Admitting: Primary Care

## 2021-08-26 DIAGNOSIS — F411 Generalized anxiety disorder: Secondary | ICD-10-CM
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
## 2021-08-27 ENCOUNTER — Other Ambulatory Visit: Payer: Self-pay

## 2021-08-27 ENCOUNTER — Ambulatory Visit (INDEPENDENT_AMBULATORY_CARE_PROVIDER_SITE_OTHER): Payer: 59 | Admitting: Family Medicine

## 2021-08-27 ENCOUNTER — Encounter: Payer: Self-pay | Admitting: Family Medicine

## 2021-08-27 ENCOUNTER — Telehealth: Payer: 59 | Admitting: Nurse Practitioner

## 2021-08-27 VITALS — BP 118/86 | HR 82 | Ht 65.0 in | Wt 270.2 lb

## 2021-08-27 DIAGNOSIS — N3000 Acute cystitis without hematuria: Secondary | ICD-10-CM | POA: Diagnosis not present

## 2021-08-27 DIAGNOSIS — S060X0A Concussion without loss of consciousness, initial encounter: Secondary | ICD-10-CM | POA: Diagnosis not present

## 2021-08-27 DIAGNOSIS — R42 Dizziness and giddiness: Secondary | ICD-10-CM

## 2021-08-27 HISTORY — DX: Concussion without loss of consciousness, initial encounter: S06.0X0A

## 2021-08-27 MED ORDER — NORTRIPTYLINE HCL 25 MG PO CAPS: 25.0000 mg | ORAL_CAPSULE | Freq: Every day | ORAL | 2 refills | Status: DC

## 2021-08-27 MED ORDER — MECLIZINE HCL 12.5 MG PO TABS: 25.0000 mg | ORAL_TABLET | Freq: Three times a day (TID) | ORAL | Status: DC

## 2021-08-27 MED ORDER — NITROFURANTOIN MONOHYD MACRO 100 MG PO CAPS: 100.0000 mg | ORAL_CAPSULE | Freq: Two times a day (BID) | ORAL | 0 refills | Status: AC

## 2021-08-27 MED ORDER — NORTRIPTYLINE HCL 25 MG PO CAPS: 25.0000 mg | ORAL_CAPSULE | Freq: Every day | ORAL | 3 refills | Status: DC

## 2021-08-27 NOTE — Patient Instructions (Addendum)
Thank you for coming in today.   Take the nortriptyline at bedtime.  Take the meclizine 3 times a day to help with dizziness.  I've referred you to Physical Therapy.  Let us know if you don't hear from them in one week.

## 2021-08-27 NOTE — Progress Notes (Signed)
E-Visit for Urinary Problems  We are sorry that you are not feeling well.  Here is how we plan to help!  Based on what you shared with me it looks like you most likely have a simple urinary tract infection.  A UTI (Urinary Tract Infection) is a bacterial infection of the bladder.  Most cases of urinary tract infections are simple to treat but a key part of your care is to encourage you to drink plenty of fluids and watch your symptoms carefully.  I have prescribed MacroBid 100 mg twice a day for 5 days.  Your symptoms should gradually improve. Call us if the burning in your urine worsens, you develop worsening fever, back pain or pelvic pain or if your symptoms do not resolve after completing the antibiotic.  Urinary tract infections can be prevented by drinking plenty of water to keep your body hydrated.  Also be sure when you wipe, wipe from front to back and don't hold it in!  If possible, empty your bladder every 4 hours.  HOME CARE Drink plenty of fluids Compete the full course of the antibiotics even if the symptoms resolve Remember, when you need to go.go. Holding in your urine can increase the likelihood of getting a UTI! GET HELP RIGHT AWAY IF: You cannot urinate You get a high fever Worsening back pain occurs You see blood in your urine You feel sick to your stomach or throw up You feel like you are going to pass out  MAKE SURE YOU  Understand these instructions. Will watch your condition. Will get help right away if you are not doing well or get worse.   Thank you for choosing an e-visit.  Your e-visit answers were reviewed by a board certified advanced clinical practitioner to complete your personal care plan. Depending upon the condition, your plan could have included both over the counter or prescription medications.  Please review your pharmacy choice. Make sure the pharmacy is open so you can pick up prescription now. If there is a problem, you may contact your  provider through [employer] and have the prescription routed to another pharmacy.  Your safety is important to [place]. If you have drug allergies check your prescription carefully.   For the next 24 hours you can use MyChart to ask questions about today's visit, request a non-urgent call back, or ask for a work or school excuse. You will get an email in the next two days asking about your experience. I hope that your e-visit has been valuable and will speed your recovery.   I spent approximately 7 minutes reviewing the patient's history, current symptoms and coordinating their plan of care today.    Meds ordered this encounter  Medications   nitrofurantoin, macrocrystal-monohydrate, (MACROBID) 100 MG capsule    Sig: Take 1 capsule (100 mg total) by mouth 2 (two) times daily for 5 days.    Dispense:  10 capsule    Refill:  0

## 2021-08-28 ENCOUNTER — Encounter: Payer: Self-pay | Admitting: Family Medicine

## 2021-08-29 ENCOUNTER — Encounter: Payer: Self-pay | Admitting: Family Medicine

## 2021-08-30 MED ORDER — PROMETHAZINE HCL 25 MG PO TABS: 25.0000 mg | ORAL_TABLET | Freq: Four times a day (QID) | ORAL | 2 refills | Status: DC | PRN
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
## 2021-09-10 ENCOUNTER — Ambulatory Visit (INDEPENDENT_AMBULATORY_CARE_PROVIDER_SITE_OTHER): Payer: 59 | Admitting: Family Medicine

## 2021-09-10 ENCOUNTER — Other Ambulatory Visit: Payer: Self-pay

## 2021-09-10 VITALS — BP 122/82 | HR 103 | Ht 65.0 in | Wt 271.6 lb

## 2021-09-10 DIAGNOSIS — S060X0D Concussion without loss of consciousness, subsequent encounter: Secondary | ICD-10-CM

## 2021-09-10 DIAGNOSIS — H9313 Tinnitus, bilateral: Secondary | ICD-10-CM

## 2021-09-10 DIAGNOSIS — M542 Cervicalgia: Secondary | ICD-10-CM

## 2021-09-10 DIAGNOSIS — R42 Dizziness and giddiness: Secondary | ICD-10-CM | POA: Diagnosis not present

## 2021-09-10 MED ORDER — TIZANIDINE HCL 4 MG PO TABS: 4.0000 mg | ORAL_TABLET | Freq: Three times a day (TID) | ORAL | 1 refills | Status: DC | PRN

## 2021-09-10 NOTE — Patient Instructions (Addendum)
Thank you for coming in today.   Recheck in 2 weeks.   Let me know if you need an updated work note before then.   Let me know if you need refills.   Heating Pad.   TENS unit.   White noise in the bedroom at night for tinnitus.

## 2021-09-20 DIAGNOSIS — R0609 Other forms of dyspnea: Secondary | ICD-10-CM

## 2021-09-24 ENCOUNTER — Other Ambulatory Visit: Payer: Self-pay

## 2021-09-24 ENCOUNTER — Ambulatory Visit (INDEPENDENT_AMBULATORY_CARE_PROVIDER_SITE_OTHER): Payer: 59 | Admitting: Family Medicine

## 2021-09-24 VITALS — BP 126/82 | HR 102 | Ht 65.0 in | Wt 275.2 lb

## 2021-09-24 DIAGNOSIS — S060X0D Concussion without loss of consciousness, subsequent encounter: Secondary | ICD-10-CM | POA: Diagnosis not present

## 2021-09-24 DIAGNOSIS — R42 Dizziness and giddiness: Secondary | ICD-10-CM | POA: Diagnosis not present

## 2021-09-24 DIAGNOSIS — H9313 Tinnitus, bilateral: Secondary | ICD-10-CM | POA: Diagnosis not present

## 2021-09-24 MED ORDER — NORTRIPTYLINE HCL 50 MG PO CAPS: 50.0000 mg | ORAL_CAPSULE | Freq: Every day | ORAL | 1 refills | Status: DC
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
## 2021-09-24 NOTE — Patient Instructions (Addendum)
Thank you for coming in today.   Continue physical therapy   I've referred you to [facility] audiology for the ringing in your ears  Recheck back in 1 month

## 2021-09-30 ENCOUNTER — Encounter: Payer: Self-pay | Admitting: Family Medicine

## 2021-10-01 ENCOUNTER — Other Ambulatory Visit: Payer: Self-pay | Admitting: Physical Therapy

## 2021-10-01 MED ORDER — TIZANIDINE HCL 4 MG PO TABS: 4.0000 mg | ORAL_TABLET | Freq: Three times a day (TID) | ORAL | 3 refills | Status: DC | PRN

## 2021-10-03 ENCOUNTER — Encounter: Payer: Self-pay | Admitting: Family Medicine

## 2021-10-03 NOTE — Progress Notes (Signed)
Hi Dr. Georgina Snell, The medicine is working great for sleeping.  There is not much improvement on my shoulder and neck n so wondering if I need to see someone for that.  Headaches are still bothering me but I am able to focus.  Still have dizziness and nausea.  And of course the ringing in my ears doesn't go away.      Also I believe I can go back to work 8 hours a day 5 days a week so if you could update the drs note.  I will need it to say 40 hours only due to [place] being in our busier time and I am just getting back to work.  I am doing good at the 6 hours just get tired and take small breaks so I will do the same at 8 hours a day. Not sure if you need to put the breaks in the note.    Thanks!    Kelli Cox

## 2021-10-11 ENCOUNTER — Encounter: Payer: Self-pay | Admitting: Cardiology

## 2021-10-11 ENCOUNTER — Ambulatory Visit (INDEPENDENT_AMBULATORY_CARE_PROVIDER_SITE_OTHER): Payer: 59 | Admitting: Cardiology

## 2021-10-11 ENCOUNTER — Encounter: Payer: Self-pay | Admitting: Family Medicine

## 2021-10-11 ENCOUNTER — Other Ambulatory Visit: Payer: Self-pay

## 2021-10-11 VITALS — BP 120/80 | HR 105 | Ht 65.0 in | Wt 277.0 lb

## 2021-10-11 DIAGNOSIS — Z01810 Encounter for preprocedural cardiovascular examination: Secondary | ICD-10-CM | POA: Diagnosis not present

## 2021-10-11 DIAGNOSIS — R0609 Other forms of dyspnea: Secondary | ICD-10-CM

## 2021-10-11 NOTE — Progress Notes (Signed)
Cardiology Office Note:    Date:  10/11/2021   ID:  [name], DOB 29-Sep-1976, MRN [id number]  PCP:  Kelli Koch, NP   [facility] Providers Cardiologist:  None     Referring MD: Kelli Koch, NP   Chief Complaint  Patient presents with   New Patient (Initial Visit)    Referred by PCP for DOE. Patient states she is going through the IVF process and needs a stress test. Meds reviewed verbally with patient.    Kelli Cox is a 45 y.o. female who is being seen today for the evaluation of dyspnea on exertion at the request of Kelli Koch, NP.   History of Present Illness:    Kelli Cox is a 45 y.o. female with a hx of diabetes, anxiety, migraines who presents due to shortness of breath on exertion.  She states having shortness of breath with exertion over the past several months.  Previously used to do exercise, but stopped doing that 2 months ago after having a concussion.  [place], but daughter apparently fell on her head.  She is planning on undergoing IVF procedure, one of her 3 procedural work-up is obtaining a stress test.  Denies any personal history of heart disease.  Denies chest pain.  Father had an MI in his 34s.  Past Medical History:  Diagnosis Date   Acute non-recurrent sinusitis 10/17/2020   Asthma    Chicken pox    GAD (generalized anxiety disorder)    Migraines    Pain of upper abdomen 01/23/2021   Type 2 diabetes mellitus ([place])     Past Surgical History:  Procedure Laterality Date   CHOLECYSTECTOMY  2008   COLONOSCOPY WITH PROPOFOL N/A 07/05/2021   Procedure: COLONOSCOPY WITH PROPOFOL;  Surgeon: Jonathon Bellows, MD;  Location: [facility] ENDOSCOPY;  Service: Gastroenterology;  Laterality: N/A;   LIPOMA RESECTION  2012   WRIST SURGERY Left    2001 and 2007    Current Medications: Current Meds  Medication Sig   albuterol (PROVENTIL) (2.5 MG/3ML) 0.083% nebulizer solution Take 3 mLs (2.5 mg total) by nebulization every 6  (six) hours as needed for wheezing or shortness of breath.   albuterol (VENTOLIN HFA) 108 (90 Base) MCG/ACT inhaler Inhale 1-2 puffs into the lungs every 6 (six) hours as needed for wheezing or shortness of breath.   Ascorbic Acid (VITAMIN C) 500 MG CAPS Take by mouth.   butalbital-aspirin-caffeine (FIORINAL) 50-325-40 MG capsule Take 1 capsule by mouth every 6 (six) hours as needed for headache. For headache/migraines   cetirizine (ZYRTEC) 10 MG tablet Take 1 tablet (10 mg total) by mouth daily.   Fluticasone-Salmeterol (ADVAIR) 100-50 MCG/DOSE AEPB Inhale into the lungs.   hydrOXYzine (ATARAX/VISTARIL) 10 MG tablet Take 1-2 tablets (10-20 mg total) by mouth 2 (two) times daily as needed for anxiety.   metFORMIN (GLUCOPHAGE) 1000 MG tablet Take 1 tablet (1,000 mg total) by mouth 2 (two) times daily with a meal. For diabetes.   Multiple Vitamins-Minerals (MULTIVITAMIN WITH MINERALS) tablet Take 1 tablet by mouth daily.   nortriptyline (PAMELOR) 50 MG capsule Take 1 capsule (50 mg total) by mouth at bedtime.   promethazine (PHENERGAN) 25 MG tablet Take 1 tablet (25 mg total) by mouth every 6 (six) hours as needed for nausea or vomiting.   sertraline (ZOLOFT) 50 MG tablet Take 1 tablet (50 mg total) by mouth daily. For anxiety.   tiZANidine (ZANAFLEX) 4 MG tablet Take 1 tablet (  4 mg total) by mouth every 8 (eight) hours as needed for muscle spasms.   vitamin E 180 MG (400 UNITS) capsule Take 400 Units by mouth daily.   Current Facility-Administered Medications for the 10/11/21 encounter (Office Visit) with Kate Sable, MD  Medication   meclizine (ANTIVERT) tablet 25 mg     Allergies:   Clarithromycin   Social History   Socioeconomic History   Marital status: Single    Spouse name: Not on file   Number of children: Not on file   Years of education: Not on file   Highest education level: Master's degree (e.g., MA, MS, MEng, MEd, MSW, MBA)  Occupational History   Occupation: Clinical cytogeneticist  Tobacco Use   Smoking status: Never   Smokeless tobacco: Never  Vaping Use   Vaping Use: Never used  Substance and Sexual Activity   Alcohol use: Yes    Comment: 1 drink every two weeks    Drug use: Never   Sexual activity: Yes    Partners: Male    Birth control/protection: None  Other Topics Concern   Not on file  Social History Narrative   Not on file   Social Determinants of Health   Financial Resource Strain: Not on file  Food Insecurity: Not on file  Transportation Needs: Not on file  Physical Activity: Not on file  Stress: Not on file  Social Connections: Not on file     Family History: The patient's family history includes Alcohol abuse in her brother; Arthritis in her mother; Asthma in her maternal grandfather, maternal grandmother, mother, and sister; COPD in her maternal grandfather and maternal grandmother; Cancer in her father, maternal grandmother, and paternal grandmother; Depression in her maternal grandmother and mother; Diabetes in her father, paternal grandfather, and paternal grandmother; Early death in her father and maternal grandmother; Heart failure in her father, paternal grandfather, and another family member; Hyperlipidemia in her father, maternal grandfather, mother, paternal grandfather, and another family member; Hypertension in her brother, father, maternal grandfather, mother, and sister; Kidney disease in her mother, paternal grandfather, and another family member; Miscarriages / [place] in her sister; Osteoarthritis in her maternal grandfather and maternal grandmother.  ROS:   Please see the history of present illness.     All other systems reviewed and are negative.  EKGs/Labs/Other Studies Reviewed:    The following studies were reviewed today:   EKG:  EKG is  ordered today.  The ekg ordered today demonstrates sinus tachycardia,  Recent Labs: 01/23/2021: ALT 21; Hemoglobin 14.1; Platelets 226.0 05/23/2021: BUN 9; Creatinine,  Ser 0.72; Potassium 4.3; Sodium 137  Recent Lipid Panel    Component Value Date/Time   CHOL 138 05/23/2021 0814   TRIG 158.0 (H) 05/23/2021 0814   HDL 35.90 (L) 05/23/2021 0814   CHOLHDL 4 05/23/2021 0814   VLDL 31.6 05/23/2021 0814   LDLCALC 71 05/23/2021 0814     Risk Assessment/Calculations:          Physical Exam:    VS:  BP 120/80 (BP Location: Left Arm, Patient Position: Sitting, Cuff Size: Large)   Pulse (!) 105   Ht 5\' 5"  (1.651 m)   Wt 277 lb (125.6 kg)   SpO2 97%   BMI 46.10 kg/m     Wt Readings from Last 3 Encounters:  10/11/21 277 lb (125.6 kg)  09/24/21 275 lb 3.2 oz (124.8 kg)  09/10/21 271 lb 9.6 oz (123.2 kg)     GEN:  Well nourished, well developed  in no acute distress HEENT: Normal NECK: No JVD; No carotid bruits LYMPHATICS: No lymphadenopathy CARDIAC: RRR, no murmurs, rubs, gallops RESPIRATORY:  Clear to auscultation without rales, wheezing or rhonchi  ABDOMEN: Soft, non-tender, non-distended MUSCULOSKELETAL:  No edema; No deformity  SKIN: Warm and dry NEUROLOGIC:  Alert and oriented x 3 PSYCHIATRIC:  Normal affect   ASSESSMENT:    1. Dyspnea on exertion   2. Morbid obesity ([place])   3. Pre-procedural cardiovascular examination    PLAN:    In order of problems listed above:  Dyspnea on exertion, get echocardiogram, get Lexiscan Myoview to evaluate any significant ischemia. Morbid obesity, low-calorie diet, weight loss advised Preprocedure exam prior to IVF.  Patient not able to undergo exercise stress testing due to headaches, obesity.  Obtain Lexiscan Myoview as above.  Follow-up after echo and stress test.    Shared Decision Making/Informed Consent The risks [chest pain, shortness of breath, cardiac arrhythmias, dizziness, blood pressure fluctuations, myocardial infarction, stroke/transient ischemic attack, nausea, vomiting, allergic reaction, radiation exposure, metallic taste sensation and life-threatening complications (estimated  to be 1 in 10,000)], benefits (risk stratification, diagnosing coronary artery disease, treatment guidance) and alternatives of a nuclear stress test were discussed in detail with Ms. Bermea and she agrees to proceed.    Medication Adjustments/Labs and Tests Ordered: Current medicines are reviewed at length with the patient today.  Concerns regarding medicines are outlined above.  Orders Placed This Encounter  Procedures   NM Myocar Multi W/Spect W/Wall Motion / EF   EKG 12-Lead   ECHOCARDIOGRAM COMPLETE    No orders of the defined types were placed in this encounter.   Patient Instructions  Medication Instructions:  Your physician recommends that you continue on your current medications as directed. Please refer to the Current Medication list given to you today.  *If you need a refill on your cardiac medications before your next appointment, please call your pharmacy*   Lab Work: None ordered If you have labs (blood work) drawn today and your tests are completely normal, you will receive your results only by: [place] (if you have MyChart) OR A paper copy in the mail If you have any lab test that is abnormal or we need to change your treatment, we will call you to review the results.   Testing/Procedures:   Your physician has requested that you have an echocardiogram. Echocardiography is a painless test that uses sound waves to create images of your heart. It provides your doctor with information about the size and shape of your heart and how well your heart's chambers and valves are working. This procedure takes approximately one hour. There are no restrictions for this procedure.    2.   [place]      Your caregiver has ordered a Stress Test with nuclear imaging. The purpose of this test is to evaluate the blood supply to your heart muscle. This procedure is referred to as a "Non-Invasive Stress Test." This is because other than having an IV started in your vein,  nothing is inserted or "invades" your body. Cardiac stress tests are done to find areas of poor blood flow to the heart by determining the extent of coronary artery disease (CAD). Some patients exercise on a treadmill, which naturally increases the blood flow to your heart, while others who are  unable to walk on a treadmill due to physical limitations have a pharmacologic/chemical stress agent called Lexiscan . This medicine will mimic walking on a treadmill by temporarily  increasing your coronary blood flow.      PLEASE REPORT TO [facility] MEDICAL MALL ENTRANCE   THE VOLUNTEERS AT THE FIRST DESK WILL DIRECT YOU WHERE TO GO     *Please note: these test may take anywhere between 2-4 hours to complete       Date of Procedure:_____________________________________   Arrival Time for Procedure:______________________________    PLEASE NOTIFY THE OFFICE AT LEAST 24 HOURS IN ADVANCE IF YOU ARE UNABLE TO KEEP YOUR APPOINTMENT.  [place] 24 HOURS IN ADVANCE IF YOU ARE UNABLE TO KEEP YOUR APPOINTMENT. [phone]     How to prepare for your Myoview test:         _XX___:  Hold diabetes medication the morning of procedure: metFORMIN (GLUCOPHAGE)   1. Do not eat or drink after midnight  2. No caffeine for 24 hours prior to test  3. No smoking 24 hours prior to test.  4. Unless instructed otherwise, Take your medication with a small sips of water.    5.         Ladies, please do not wear dresses. Skirts or pants are appropriate. Please wear a short sleeve shirt.  6. No perfume, cologne or lotion.  7. Wear comfortable walking shoes. No heels!     Follow-Up: At [facility], you and your health needs are our priority.  As part of our continuing mission to provide you with exceptional heart care, we have created designated Provider Care Teams.  These Care Teams include your primary Cardiologist (physician) and Advanced Practice Providers (APPs  -  Physician Assistants and Nurse Practitioners) who all work together to provide you with the care you need, when you need it.  We recommend signing up for the patient portal called "MyChart".  Sign up information is provided on this After Visit Summary.  MyChart is used to connect with patients for Virtual Visits (Telemedicine).  Patients are able to view lab/test results, encounter notes, upcoming appointments, etc.  Non-urgent messages can be sent to your provider as well.   To learn more about what you can do with MyChart, go to [email or web address].    Your next appointment:   Follow up after testing   The format for your next appointment:   In Person  Provider:   You may see Dr. Garen Lah or one of the following Advanced Practice Providers on your designated Care Team:   Murray Hodgkins, NP Christell Faith, PA-C Cadence Kathlen Mody, [place]    Other Instructions    Signed, Kate Sable, MD  10/11/2021 12:16 PM    [place]

## 2021-10-11 NOTE — Patient Instructions (Signed)
Medication Instructions:  Your physician recommends that you continue on your current medications as directed. Please refer to the Current Medication list given to you today.  *If you need a refill on your cardiac medications before your next appointment, please call your pharmacy*   Lab Work: None ordered If you have labs (blood work) drawn today and your tests are completely normal, you will receive your results only by: [place] (if you have MyChart) OR A paper copy in the mail If you have any lab test that is abnormal or we need to change your treatment, we will call you to review the results.   Testing/Procedures:   Your physician has requested that you have an echocardiogram. Echocardiography is a painless test that uses sound waves to create images of your heart. It provides your doctor with information about the size and shape of your heart and how well your heart's chambers and valves are working. This procedure takes approximately one hour. There are no restrictions for this procedure.    2.   [place]      Your caregiver has ordered a Stress Test with nuclear imaging. The purpose of this test is to evaluate the blood supply to your heart muscle. This procedure is referred to as a "Non-Invasive Stress Test." This is because other than having an IV started in your vein, nothing is inserted or "invades" your body. Cardiac stress tests are done to find areas of poor blood flow to the heart by determining the extent of coronary artery disease (CAD). Some patients exercise on a treadmill, which naturally increases the blood flow to your heart, while others who are  unable to walk on a treadmill due to physical limitations have a pharmacologic/chemical stress agent called Lexiscan . This medicine will mimic walking on a treadmill by temporarily increasing your coronary blood flow.      PLEASE REPORT TO [facility] MEDICAL MALL ENTRANCE   THE VOLUNTEERS AT THE FIRST DESK WILL DIRECT  YOU WHERE TO GO     *Please note: these test may take anywhere between 2-4 hours to complete       Date of Procedure:_____________________________________   Arrival Time for Procedure:______________________________    PLEASE NOTIFY THE OFFICE AT LEAST 24 HOURS IN ADVANCE IF YOU ARE UNABLE TO KEEP YOUR APPOINTMENT.  [place] 24 HOURS IN ADVANCE IF YOU ARE UNABLE TO KEEP YOUR APPOINTMENT. [phone]     How to prepare for your Myoview test:         _XX___:  Hold diabetes medication the morning of procedure: metFORMIN (GLUCOPHAGE)   1. Do not eat or drink after midnight  2. No caffeine for 24 hours prior to test  3. No smoking 24 hours prior to test.  4. Unless instructed otherwise, Take your medication with a small sips of water.    5.         Ladies, please do not wear dresses. Skirts or pants are appropriate. Please wear a short sleeve shirt.  6. No perfume, cologne or lotion.  7. Wear comfortable walking shoes. No heels!     Follow-Up: At [facility], you and your health needs are our priority.  As part of our continuing mission to provide you with exceptional heart care, we have created designated Provider Care Teams.  These Care Teams include your primary Cardiologist (physician) and Advanced Practice Providers (APPs -  Physician Assistants and Nurse Practitioners) who all work together  to provide you with the care you need, when you need it.  We recommend signing up for the patient portal called "MyChart".  Sign up information is provided on this After Visit Summary.  MyChart is used to connect with patients for Virtual Visits (Telemedicine).  Patients are able to view lab/test results, encounter notes, upcoming appointments, etc.  Non-urgent messages can be sent to your provider as well.   To learn more about what you can do with MyChart, go to [email or web address].    Your next appointment:   Follow up after  testing   The format for your next appointment:   In Person  Provider:   You may see Dr. Garen Lah or one of the following Advanced Practice Providers on your designated Care Team:   Murray Hodgkins, NP Christell Faith, PA-C Cadence Kathlen Mody, [place]    Other Instructions

## 2021-10-14 ENCOUNTER — Other Ambulatory Visit: Payer: Self-pay

## 2021-10-14 MED ORDER — PROMETHAZINE HCL 25 MG PO TABS: 25.0000 mg | ORAL_TABLET | Freq: Four times a day (QID) | ORAL | 2 refills | Status: DC | PRN

## 2021-10-16 ENCOUNTER — Other Ambulatory Visit: Payer: Self-pay | Admitting: Family Medicine

## 2021-10-16 ENCOUNTER — Other Ambulatory Visit: Payer: Self-pay | Admitting: Family
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
## 2021-10-17 ENCOUNTER — Ambulatory Visit: Payer: 59 | Admitting: Family Medicine

## 2021-10-17 ENCOUNTER — Encounter: Payer: Self-pay | Admitting: Family Medicine

## 2021-10-17 ENCOUNTER — Ambulatory Visit (INDEPENDENT_AMBULATORY_CARE_PROVIDER_SITE_OTHER): Payer: 59 | Admitting: Family Medicine

## 2021-10-17 ENCOUNTER — Ambulatory Visit: Payer: Self-pay

## 2021-10-17 ENCOUNTER — Other Ambulatory Visit: Payer: Self-pay

## 2021-10-17 VITALS — BP 148/90 | HR 110 | Ht 65.0 in | Wt 276.4 lb

## 2021-10-17 DIAGNOSIS — M25512 Pain in left shoulder: Secondary | ICD-10-CM | POA: Diagnosis not present

## 2021-10-17 DIAGNOSIS — S060X0D Concussion without loss of consciousness, subsequent encounter: Secondary | ICD-10-CM

## 2021-10-17 NOTE — Patient Instructions (Addendum)
Good to see you today.  Con't PT.  Please get an Xray today before you leave.  Let me know if you need any additional medicine for your headache like we discussed.  Follow-up: 4 weeks

## 2021-10-18 ENCOUNTER — Ambulatory Visit (INDEPENDENT_AMBULATORY_CARE_PROVIDER_SITE_OTHER)
Admission: RE | Admit: 2021-10-18 | Discharge: 2021-10-18 | Disposition: A | Discharge status: Home / Self Care | Payer: 59 | Source: Ambulatory Visit | Attending: Family Medicine | Admitting: Family Medicine

## 2021-10-18 DIAGNOSIS — M25512 Pain in left shoulder: Secondary | ICD-10-CM | POA: Diagnosis not present

## 2021-10-18 IMAGING — DX DG SHOULDER 2+V*L*
3 series · 3 of 3 positions shown · non-contrast
Comparison: None.

CLINICAL DATA: Left shoulder pain.

EXAM:
LEFT SHOULDER - 2+ VIEW

[grashey (1 of 2)]
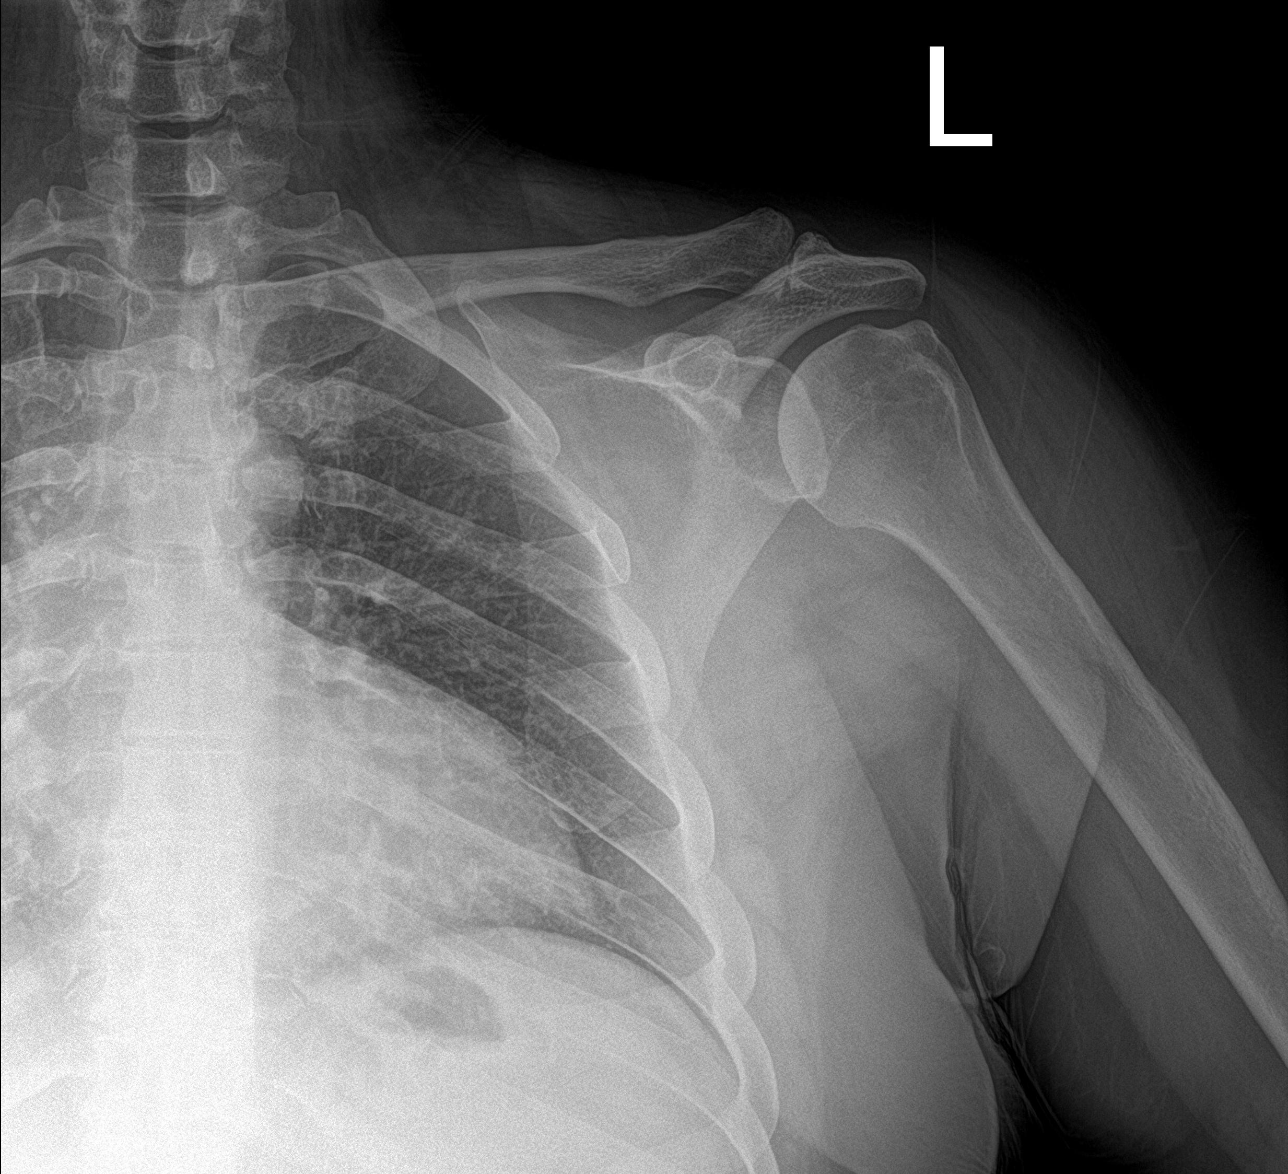

[y view]
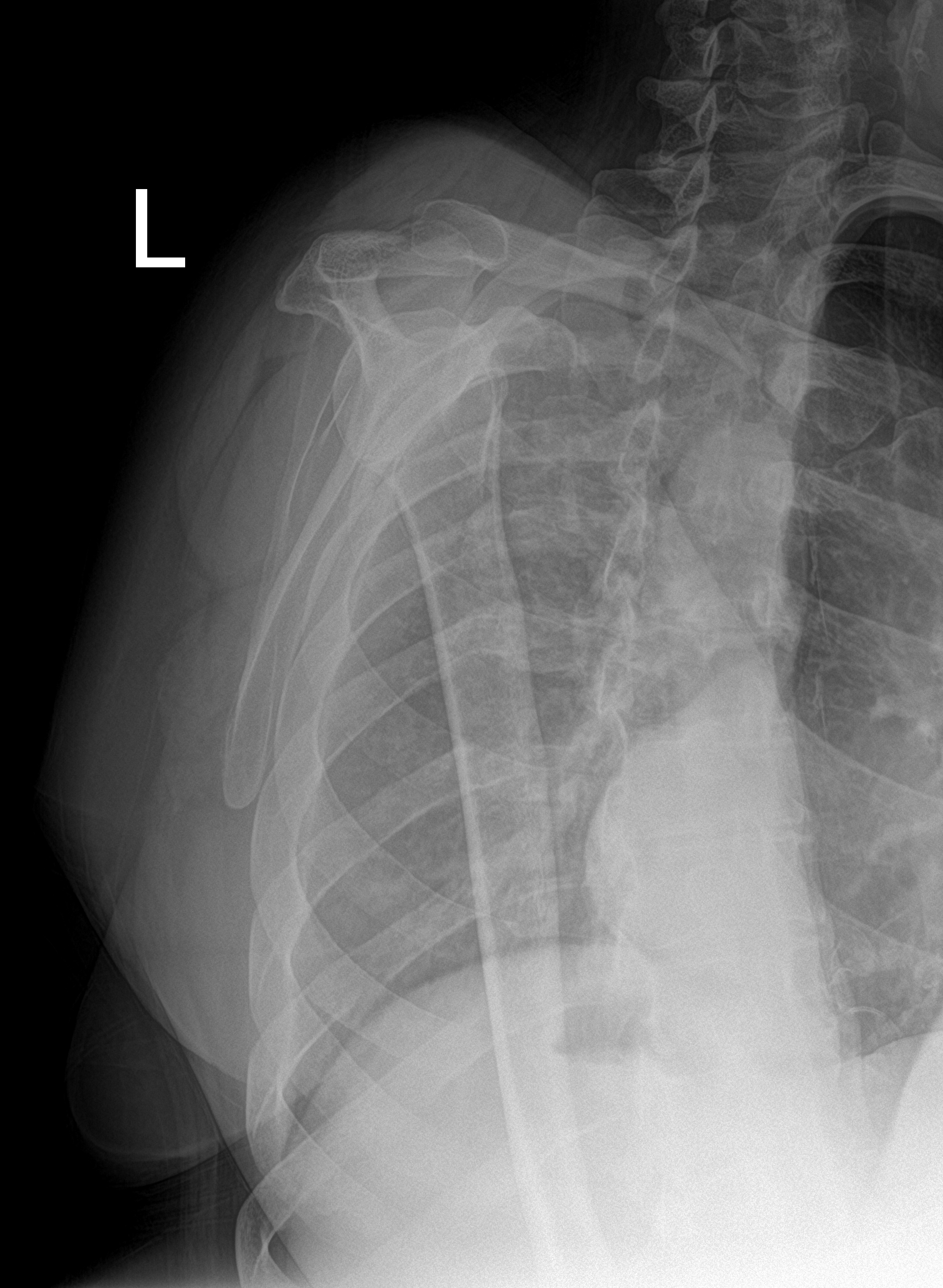

[grashey (2 of 2)]
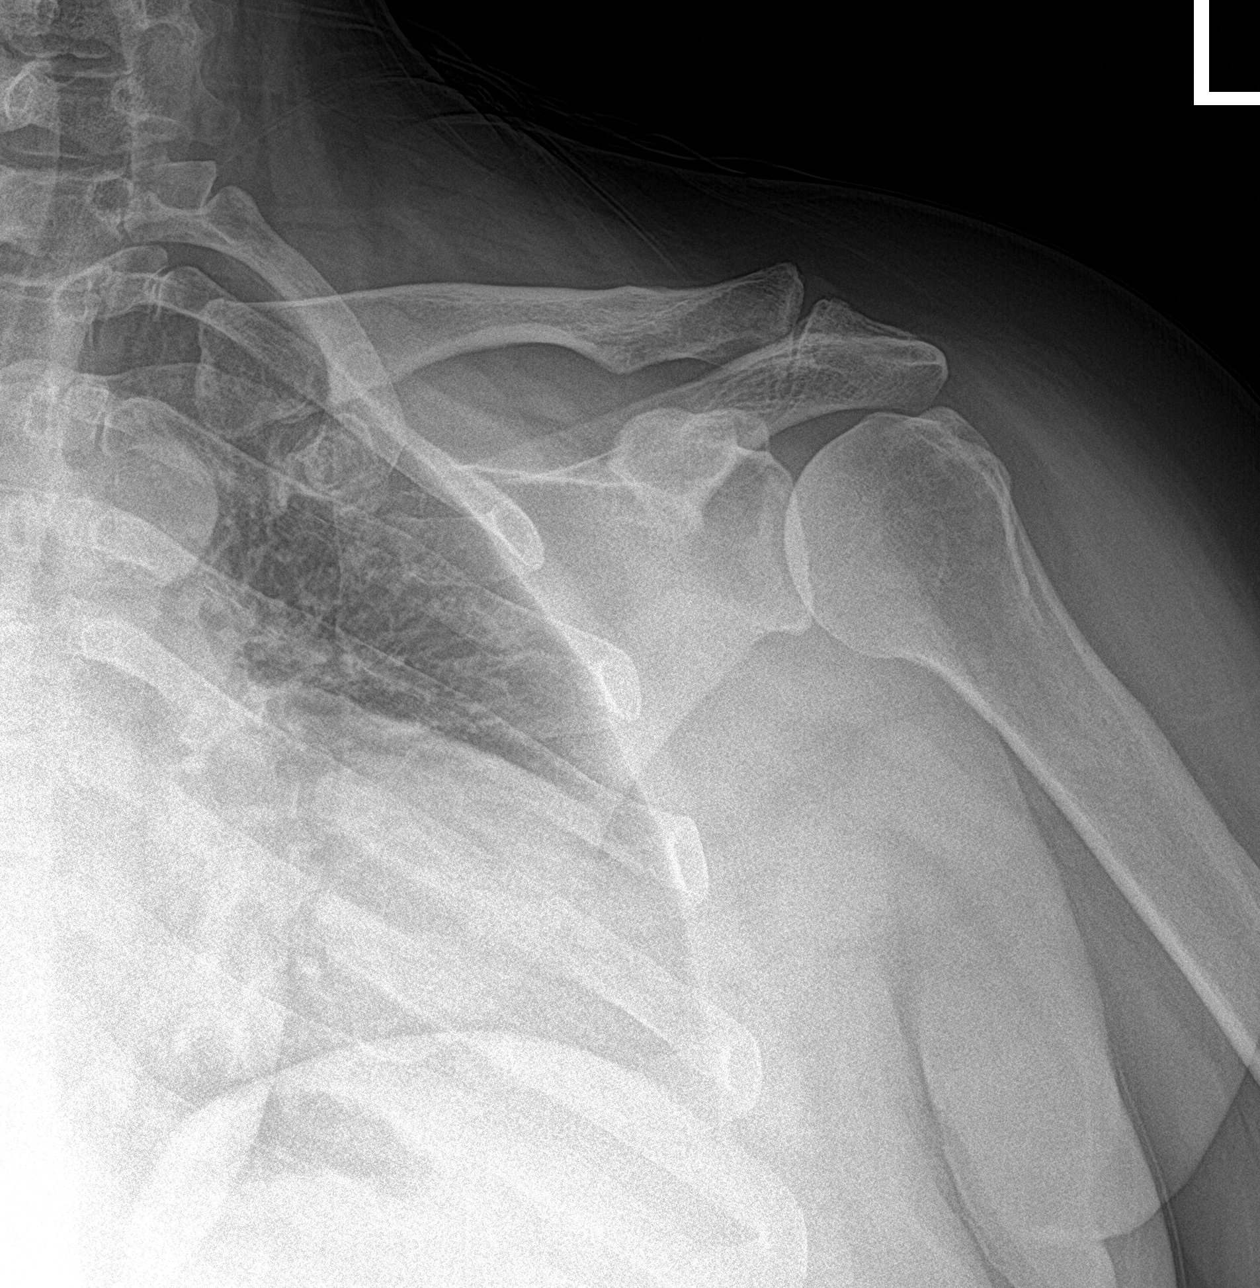

[3 of 3 positions shown; findings below may reference images not displayed]

FINDINGS: There is no evidence of fracture or dislocation. There are mild
degenerative changes of the acromioclavicular joint. Glenohumeral
joint is within normal limits. Soft tissues are unremarkable.
IMPRESSION: Mild degenerative changes of the acromioclavicular joint.

No acute bony abnormality.

## 2021-10-21 ENCOUNTER — Encounter: Payer: Self-pay | Admitting: Family Medicine

## 2021-10-21 NOTE — Progress Notes (Signed)
Left shoulder x-ray shows some mild arthritis changes of the small joint at the top of the shoulder (the [facility] joint)

## 2021-10-29 ENCOUNTER — Other Ambulatory Visit: Payer: Self-pay

## 2021-10-29 ENCOUNTER — Encounter
Admission: RE | Admit: 2021-10-29 | Discharge: 2021-10-29 | Disposition: A | Discharge status: Home / Self Care | Payer: 59 | Source: Ambulatory Visit | Attending: Cardiology | Admitting: Cardiology

## 2021-10-29 ENCOUNTER — Encounter: Payer: Self-pay | Admitting: Family Medicine

## 2021-10-29 DIAGNOSIS — R0609 Other forms of dyspnea: Secondary | ICD-10-CM

## 2021-10-29 LAB — NM MYOCAR MULTI W/SPECT W/WALL MOTION / EF
LV dias vol: 66 mL (ref 46–106)
LV sys vol: 21 mL
MPHR: 175 {beats}/min
Nuc Stress EF: 68 %
Peak HR: 120 {beats}/min
Percent HR: 68 %
Rest HR: 86 {beats}/min
Rest Nuclear Isotope Dose: 10.5 mCi
SDS: 1
SRS: 2
SSS: 4
ST Depression (mm): 0 mm
Stress Nuclear Isotope Dose: 30.5 mCi
TID: 0.77

## 2021-10-29 MED ORDER — TECHNETIUM TC 99M TETROFOSMIN IV KIT
10.0000 | PACK | Freq: Once | INTRAVENOUS | Status: AC | PRN
  Administered 2021-10-29: 10.52 via INTRAVENOUS

## 2021-10-29 MED ORDER — TECHNETIUM TC 99M TETROFOSMIN IV KIT
30.4700 | PACK | Freq: Once | INTRAVENOUS | Status: AC | PRN
  Administered 2021-10-29: 30.47 via INTRAVENOUS

## 2021-10-29 MED ORDER — REGADENOSON 0.4 MG/5ML IV SOLN
0.4000 mg | Freq: Once | INTRAVENOUS | Status: AC
Start: 2021-10-29 — End: 2021-10-29
  Administered 2021-10-29: 0.4 mg via INTRAVENOUS

## 2021-10-30 ENCOUNTER — Encounter: Payer: Self-pay | Admitting: Family Medicine

## 2021-10-31 ENCOUNTER — Ambulatory Visit: Payer: 59

## 2021-10-31 MED ORDER — TOPIRAMATE 50 MG PO TABS: ORAL_TABLET | ORAL | 1 refills | Status: DC

## 2021-11-12 ENCOUNTER — Other Ambulatory Visit: Payer: Self-pay

## 2021-11-12 ENCOUNTER — Encounter: Payer: Self-pay | Admitting: Primary Care

## 2021-11-12 ENCOUNTER — Ambulatory Visit: Payer: 59 | Admitting: Primary Care

## 2021-11-12 VITALS — BP 140/88 | HR 100 | Temp 98.6°F | Ht 65.0 in | Wt 276.0 lb

## 2021-11-12 DIAGNOSIS — E1165 Type 2 diabetes mellitus with hyperglycemia: Secondary | ICD-10-CM | POA: Diagnosis not present

## 2021-11-12 DIAGNOSIS — Z23 Encounter for immunization: Secondary | ICD-10-CM

## 2021-11-12 LAB — POCT GLYCOSYLATED HEMOGLOBIN (HGB A1C): Hemoglobin A1C: 9.1 % — AB (ref 4.0–5.6)

## 2021-11-12 MED ORDER — OZEMPIC (0.25 OR 0.5 MG/DOSE) 2 MG/1.5ML ~~LOC~~ SOPN: PEN_INJECTOR | SUBCUTANEOUS | 0 refills | Status: DC
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
## 2021-11-12 NOTE — Assessment & Plan Note (Signed)
Deteriorated, poor diet and no exercise.  Discussed options, would like to get more aggressive in treatment.  Continue metformin 1000 mg BID. Add Ozempic 0.25 mg weekly x 4 weeks, increase to 0.5 mg weekly thereafter.  We will plan to see her back in 3 months for follow up. She will schedule eye exam.  Foot exam UTD.  No longer on ACE-I given IVF process. Discussed to monitor BP.

## 2021-11-12 NOTE — Patient Instructions (Signed)
Start Ozempic 0.25 mg once weekly for diabetes. Inject 0.25 mg into the skin for 4 weeks, then increase to 0.5 mg once weekly thereafter.  It is important that you improve your diet. Please limit carbohydrates in the form of white bread, rice, pasta, sweets, fast food, fried food, sugary drinks, etc. Increase your consumption of fresh fruits and vegetables, whole grains, lean protein.  Ensure you are consuming 64 ounces of water daily.  Start exercising as tolerated. You should be getting 150 minutes of moderate intensity exercise weekly.  We will see you in 3 months.  It was a pleasure to see you today!
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
## 2021-11-14 ENCOUNTER — Ambulatory Visit (INDEPENDENT_AMBULATORY_CARE_PROVIDER_SITE_OTHER): Payer: 59 | Admitting: Family Medicine

## 2021-11-14 ENCOUNTER — Other Ambulatory Visit: Payer: Self-pay

## 2021-11-14 ENCOUNTER — Ambulatory Visit: Payer: 59 | Admitting: Family Medicine

## 2021-11-14 VITALS — BP 162/104 | HR 96 | Ht 65.0 in | Wt 275.0 lb

## 2021-11-14 DIAGNOSIS — H9313 Tinnitus, bilateral: Secondary | ICD-10-CM

## 2021-11-14 DIAGNOSIS — M542 Cervicalgia: Secondary | ICD-10-CM

## 2021-11-14 DIAGNOSIS — S060X0D Concussion without loss of consciousness, subsequent encounter: Secondary | ICD-10-CM | POA: Diagnosis not present

## 2021-11-14 NOTE — Patient Instructions (Addendum)
Thank you for coming in today.   Please call [facility] at [phone] to schedule your MRI.  Check back after MRI

## 2021-11-18 ENCOUNTER — Ambulatory Visit (INDEPENDENT_AMBULATORY_CARE_PROVIDER_SITE_OTHER): Payer: 59

## 2021-11-18 ENCOUNTER — Other Ambulatory Visit: Payer: Self-pay

## 2021-11-18 DIAGNOSIS — R0609 Other forms of dyspnea: Secondary | ICD-10-CM | POA: Diagnosis not present

## 2021-11-18 LAB — ECHOCARDIOGRAM COMPLETE
Area-P 1/2: 5.75 cm2
S' Lateral: 2.9 cm
Single Plane A4C EF: 67.6 %

## 2021-11-18 MED ORDER — PERFLUTREN LIPID MICROSPHERE
1.0000 mL | INTRAVENOUS | Status: AC | PRN
  Administered 2021-11-18: 2 mL via INTRAVENOUS

## 2021-11-18 NOTE — Progress Notes (Unsigned)
removed

## 2021-11-20 ENCOUNTER — Other Ambulatory Visit: Payer: Self-pay | Admitting: Family Medicine

## 2021-11-26 ENCOUNTER — Other Ambulatory Visit: Payer: Self-pay | Admitting: Family Medicine

## 2021-11-27 ENCOUNTER — Encounter: Payer: Self-pay | Admitting: Family Medicine

## 2021-11-28 MED ORDER — TIZANIDINE HCL 4 MG PO TABS
4.0000 mg | ORAL_TABLET | Freq: Three times a day (TID) | ORAL | 3 refills | Status: DC | PRN
Start: 2021-11-28 — End: 2022-02-12

## 2021-11-28 MED ORDER — PROMETHAZINE HCL 25 MG PO TABS: 25.0000 mg | ORAL_TABLET | Freq: Four times a day (QID) | ORAL | 3 refills | Status: DC | PRN

## 2021-11-29 NOTE — Progress Notes (Signed)
Cardiology Office Note    Date:  12/04/2021   ID:  Kelli Cox, DOB 12-16-1975, MRN [id number]  PCP:  Pleas Koch, NP  Cardiologist:  Kate Sable, MD  Electrophysiologist:  None   Chief Complaint: Follow up  History of Present Illness:   Kelli Cox is a 45 y.o. female with history of DM2, obesity, migraine disorder, and anxiety who presents for follow-up of Lexiscan MPI and echo.  She was evaluated as a new patient by Dr. Garen Lah on 10/11/2021 for shortness of breath.  At that time, she reported shortness of breath with overexertion for several months.  She reported she previously exercised, though had to discontinue 2 months prior secondary to sustaining a concussion in the setting of a mechanical fall [place] a [employer] [place].  She also reported planning on proceeding with IVF with recommendation for preprocedure stress test.  She denied any chest pain.  Lexiscan MPI on 10/29/2021 showed no evidence of ischemia or scar with an LVEF greater than 65%.  There was no significant coronary artery calcification.  Overall, this was a low risk stress test.  Echo on 11/18/2021 demonstrated an EF of 55 to 60%, no regional wall motion abnormalities, normal LV diastolic function parameters, normal RV systolic function and ventricular cavity size, no significant valvular abnormalities, and an estimated right atrial pressure of 3 mmHg.  She comes in doing very well from a cardiac perspective.  Since she was last seen she has not had any further shortness of breath.  No chest pain, palpitations, dizziness, presyncope, or syncope.  No significant lower extremity swelling, early satiety, or orthopnea.  She notes her blood pressure is typically elevated in medical practices.  She is uncertain what her readings are at home, as she does not currently have a BP cuff.  She reports she was previously on lisinopril for renal protection, though this was discontinued in preparation for her moving forward with  IVF.  She does indicate her boyfriend has reported she snores at night.  No known apneic events.  Otherwise, she does not have any issues or concerns at this time.   Labs independently reviewed: 10/2021 - A1c 9.1 05/2021 - TC 138, TG 158, HDL 35, LDL 71, potassium 4.3, BUN 9, serum creatinine 0.72 01/2021 - albumin 4.0, AST/ALT normal, Hgb 14.1, PLT 226 03/2018 - TSH normal  Past Medical History:  Diagnosis Date   Acute non-recurrent sinusitis 10/17/2020   Asthma    Chicken pox    GAD (generalized anxiety disorder)    Migraines    Pain of upper abdomen 01/23/2021   Type 2 diabetes mellitus ([place])     Past Surgical History:  Procedure Laterality Date   CHOLECYSTECTOMY  2008   COLONOSCOPY WITH PROPOFOL N/A 07/05/2021   Procedure: COLONOSCOPY WITH PROPOFOL;  Surgeon: Jonathon Bellows, MD;  Location: [facility] ENDOSCOPY;  Service: Gastroenterology;  Laterality: N/A;   LIPOMA RESECTION  2012   WRIST SURGERY Left    2001 and 2007    Current Medications: Current Meds  Medication Sig   albuterol (PROVENTIL) (2.5 MG/3ML) 0.083% nebulizer solution Take 3 mLs (2.5 mg total) by nebulization every 6 (six) hours as needed for wheezing or shortness of breath.   albuterol (VENTOLIN HFA) 108 (90 Base) MCG/ACT inhaler Inhale 1-2 puffs into the lungs every 6 (six) hours as needed for wheezing or shortness of breath.   butalbital-aspirin-caffeine (FIORINAL) 50-325-40 MG capsule Take 1 capsule by mouth every 6 (six) hours as needed for headache. For  headache/migraines   cetirizine (ZYRTEC) 10 MG tablet Take 1 tablet (10 mg total) by mouth daily.   Fluticasone-Salmeterol (ADVAIR) 100-50 MCG/DOSE AEPB Inhale into the lungs as needed.   hydrOXYzine (ATARAX/VISTARIL) 10 MG tablet Take 1-2 tablets (10-20 mg total) by mouth 2 (two) times daily as needed for anxiety.   letrozole (FEMARA) 2.5 MG tablet Take 2.5 mg by mouth daily.   metFORMIN (GLUCOPHAGE) 1000 MG tablet Take 1 tablet (1,000 mg total) by mouth 2 (two) times  daily with a meal. For diabetes.   Multiple Vitamins-Minerals (MULTIVITAMIN WITH MINERALS) tablet Take 1 tablet by mouth daily.   nortriptyline (PAMELOR) 50 MG capsule TAKE 1 CAPSULE BY MOUTH AT BEDTIME.   ORILISSA 200 MG TABS Take 1 tablet by mouth daily.   promethazine (PHENERGAN) 25 MG tablet Take 1 tablet (25 mg total) by mouth every 6 (six) hours as needed for nausea or vomiting.   Semaglutide,0.25 or 0.5MG /DOS, (OZEMPIC, 0.25 OR 0.5 MG/DOSE,) 2 MG/1.5ML SOPN Inject 0.25 mg into the skin once weekly for 4 weeks, then increase to 0.5 mg once weekly thereafter.   sertraline (ZOLOFT) 50 MG tablet Take 1 tablet (50 mg total) by mouth daily. For anxiety.   tiZANidine (ZANAFLEX) 4 MG tablet Take 1 tablet (4 mg total) by mouth every 8 (eight) hours as needed for muscle spasms.   vitamin E 180 MG (400 UNITS) capsule Take 400 Units by mouth daily.    Allergies:   Clarithromycin   Social History   Socioeconomic History   Marital status: Single    Spouse name: Not on file   Number of children: Not on file   Years of education: Not on file   Highest education level: Master's degree (e.g., MA, MS, MEng, MEd, MSW, MBA)  Occupational History   Occupation: Government social research officer  Tobacco Use   Smoking status: Never   Smokeless tobacco: Never  Vaping Use   Vaping Use: Never used  Substance and Sexual Activity   Alcohol use: Yes    Comment: 1 drink every two weeks    Drug use: Never   Sexual activity: Yes    Partners: Male    Birth control/protection: None  Other Topics Concern   Not on file  Social History Narrative   Not on file   Social Determinants of Health   Financial Resource Strain: Not on file  Food Insecurity: Not on file  Transportation Needs: Not on file  Physical Activity: Not on file  Stress: Not on file  Social Connections: Not on file     Family History:  The patient's family history includes Alcohol abuse in her brother; Arthritis in her mother; Asthma in her maternal  grandfather, maternal grandmother, mother, and sister; COPD in her maternal grandfather and maternal grandmother; Cancer in her father, maternal grandmother, and paternal grandmother; Depression in her maternal grandmother and mother; Diabetes in her father, paternal grandfather, and paternal grandmother; Early death in her father and maternal grandmother; Heart failure in her father, paternal grandfather, and another family member; Hyperlipidemia in her father, maternal grandfather, mother, paternal grandfather, and another family member; Hypertension in her brother, father, maternal grandfather, mother, and sister; Kidney disease in her mother, paternal grandfather, and another family member; Miscarriages / [place] in her sister; Osteoarthritis in her maternal grandfather and maternal grandmother.  ROS:    12-point review of systems is negative unless otherwise noted in the HPI  EKGs/Labs/Other Studies Reviewed:    Studies reviewed were summarized above. The additional studies were  reviewed today:  Lexiscan MPI 10/29/2021:    Normal pharmacologic myocardial perfusion stress test without evidence of significant ischemia or scar.   Normal left ventricular systolic function (LVEF > 65%).   There is no significant coronary artery calcification.   Incidental note is made of hepatic steatosis on the attenuation correction CT.   This is a low risk study. __________  2D echo 11/18/2021: 1. Left ventricular ejection fraction, by estimation, is 55 to 60%. The  left ventricle has normal function. The left ventricle has no regional  wall motion abnormalities. Left ventricular diastolic parameters were  normal.   2. Right ventricular systolic function is normal. The right ventricular  size is normal.   3. The mitral valve was not well visualized. No evidence of mitral valve  regurgitation.   4. The aortic valve was not well visualized. Aortic valve regurgitation  is not visualized.   5. The  inferior vena cava is normal in size with greater than 50%  respiratory variability, suggesting right atrial pressure of 3 mmHg.   EKG:  EKG is not ordered today.    Recent Labs: 01/23/2021: ALT 21; Hemoglobin 14.1; Platelets 226.0 05/23/2021: BUN 9; Creatinine, Ser 0.72; Potassium 4.3; Sodium 137  Recent Lipid Panel    Component Value Date/Time   CHOL 138 05/23/2021 0814   TRIG 158.0 (H) 05/23/2021 0814   HDL 35.90 (L) 05/23/2021 0814   CHOLHDL 4 05/23/2021 0814   VLDL 31.6 05/23/2021 0814   LDLCALC 71 05/23/2021 0814    PHYSICAL EXAM:    VS:  BP (!) 150/100 (BP Location: Left Arm, Patient Position: Sitting, Cuff Size: Large)    Pulse (!) 112    Ht 5\' 5"  (1.651 m)    Wt 273 lb (123.8 kg)    SpO2 99%    BMI 45.43 kg/m   BMI: Body mass index is 45.43 kg/m.  Physical Exam Vitals reviewed.  Constitutional:      Appearance: She is well-developed.  HENT:     Head: Normocephalic and atraumatic.  Eyes:     General:        Right eye: No discharge.        Left eye: No discharge.  Neck:     Vascular: No JVD.  Cardiovascular:     Rate and Rhythm: Normal rate and regular rhythm.     Pulses:          Posterior tibial pulses are 2+ on the right side and 2+ on the left side.     Heart sounds: Normal heart sounds, S1 normal and S2 normal. Heart sounds not distant. No midsystolic click and no opening snap. No murmur heard.   No friction rub.  Pulmonary:     Effort: Pulmonary effort is normal. No respiratory distress.     Breath sounds: Normal breath sounds. No decreased breath sounds, wheezing or rales.  Chest:     Chest wall: No tenderness.  Abdominal:     General: There is no distension.     Palpations: Abdomen is soft.     Tenderness: There is no abdominal tenderness.  Musculoskeletal:     Cervical back: Normal range of motion.     Right lower leg: No edema.     Left lower leg: No edema.  Skin:    General: Skin is warm and dry.     Nails: There is no clubbing.   Neurological:     Mental Status: She is alert and oriented to person, place, and  time.  Psychiatric:        Speech: Speech normal.        Behavior: Behavior normal.        Thought Content: Thought content normal.        Judgment: Judgment normal.    Wt Readings from Last 3 Encounters:  12/04/21 273 lb (123.8 kg)  11/14/21 275 lb (124.7 kg)  11/12/21 276 lb (125.2 kg)     ASSESSMENT & PLAN:   Exertional dyspnea: Symptoms have resolved.  Cardiac work-up reassuring as outlined above.  No further cardiac testing indicated at this time.  Elevated BP without diagnosis of hypertension.  BP is elevated in the office today, though this was well controlled at her initial visit with [place] in 10/2021.  Advised patient to pick up a brachial BP cuff and monitor her blood pressure at home over the next several weeks.  She will send these readings into our office for review.  Obesity with sleep disordered breathing: Weight loss is encouraged to heart healthy diet and regular exercise.  Consider pursuing a sleep study in follow-up.  This was discussed with her at today's visit.     Disposition: F/u with Dr. Garen Lah or an APP in 6 months.   Medication Adjustments/Labs and Tests Ordered: Current medicines are reviewed at length with the patient today.  Concerns regarding medicines are outlined above. Medication changes, Labs and Tests ordered today are summarized above and listed in the Patient Instructions accessible in Encounters.   Signed, Christell Faith, PA-C 12/04/2021 4:29 PM     [facility] [address] [place] [place] [place], [place] [address] [phone]

## 2021-12-04 ENCOUNTER — Other Ambulatory Visit: Payer: Self-pay

## 2021-12-04 ENCOUNTER — Encounter: Payer: Self-pay | Admitting: Physician Assistant

## 2021-12-04 ENCOUNTER — Ambulatory Visit (INDEPENDENT_AMBULATORY_CARE_PROVIDER_SITE_OTHER): Payer: 59 | Admitting: Physician Assistant

## 2021-12-04 ENCOUNTER — Encounter: Payer: Self-pay | Admitting: Family Medicine

## 2021-12-04 VITALS — BP 150/100 | HR 112 | Ht 65.0 in | Wt 273.0 lb

## 2021-12-04 DIAGNOSIS — R030 Elevated blood-pressure reading, without diagnosis of hypertension: Secondary | ICD-10-CM | POA: Diagnosis not present

## 2021-12-04 DIAGNOSIS — Z6841 Body Mass Index (BMI) 40.0 and over, adult: Secondary | ICD-10-CM

## 2021-12-04 DIAGNOSIS — R0609 Other forms of dyspnea: Secondary | ICD-10-CM | POA: Diagnosis not present

## 2021-12-04 DIAGNOSIS — H9313 Tinnitus, bilateral: Secondary | ICD-10-CM

## 2021-12-04 NOTE — Patient Instructions (Addendum)
Medication Instructions:  - Your physician recommends that you continue on your current medications as directed. Please refer to the Current Medication list given to you today.  *If you need a refill on your cardiac medications before your next appointment, please call your pharmacy*   Lab Work: - none ordered  If you have labs (blood work) drawn today and your tests are completely normal, you will receive your results only by: [place] (if you have MyChart) OR A paper copy in the mail If you have any lab test that is abnormal or we need to change your treatment, we will call you to review the results.   Testing/Procedures: - none ordered   Follow-Up: At [facility], you and your health needs are our priority.  As part of our continuing mission to provide you with exceptional heart care, we have created designated Provider Care Teams.  These Care Teams include your primary Cardiologist (physician) and Advanced Practice Providers (APPs -  Physician Assistants and Nurse Practitioners) who all work together to provide you with the care you need, when you need it.  We recommend signing up for the patient portal called "MyChart".  Sign up information is provided on this After Visit Summary.  MyChart is used to connect with patients for Virtual Visits (Telemedicine).  Patients are able to view lab/test results, encounter notes, upcoming appointments, etc.  Non-urgent messages can be sent to your provider as well.   To learn more about what you can do with MyChart, go to [email or web address].    Your next appointment:   6 month(s)  The format for your next appointment:   In Person  Provider:   You may see Kate Sable, MD or one of the following Advanced Practice Providers on your designated Care Team:   Murray Hodgkins, NP Christell Faith, PA-C Cadence Kathlen Mody, [place]    Other Instructions  1) Please try to obtain an O[employer]ON brand arm (brachial) blood pressure cuff

## 2021-12-06 ENCOUNTER — Ambulatory Visit
Admission: RE | Admit: 2021-12-06 | Discharge: 2021-12-06 | Disposition: A | Discharge status: Home / Self Care | Payer: 59 | Source: Ambulatory Visit | Attending: Family Medicine | Admitting: Family Medicine

## 2021-12-06 DIAGNOSIS — M542 Cervicalgia: Secondary | ICD-10-CM

## 2021-12-06 IMAGING — MR MR CERVICAL SPINE W/O CM
5 series · 29 of 48 positions shown · non-contrast
Comparison: CT cervical spine [DATE].

CLINICAL DATA: Provided history: Neck pain. Neck pain, acute, no
red flags. Additional history provided by scanning technologist:
Patient reports neck and left shoulder pain for 3 months status post
head injury.

EXAM:
MRI CERVICAL SPINE WITHOUT CONTRAST
TECHNIQUE: Multiplanar, multisequence MR imaging of the cervical spine was
performed. No intravenous contrast was administered.

[Series 6: T1 · sagittal · 3.0mm · 0.86mm/px · 6 of 14 slices shown]
[im 1/14]
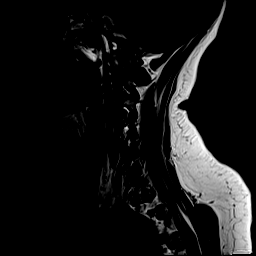
[im 3/14]
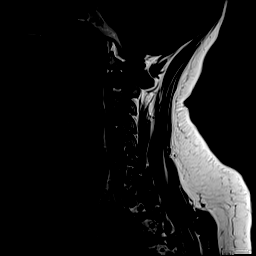
[im 6/14]
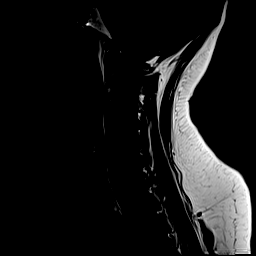
[im 8/14]
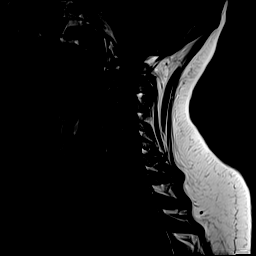
[im 11/14]
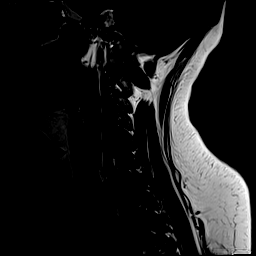
[im 14/14]
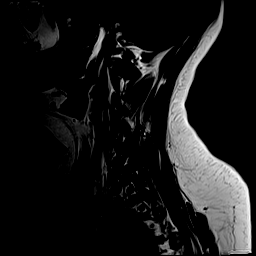

[Series 7: STIR · sagittal · 3.0mm · 0.34mm/px · 6 of 14 slices shown]
[im 1/14]
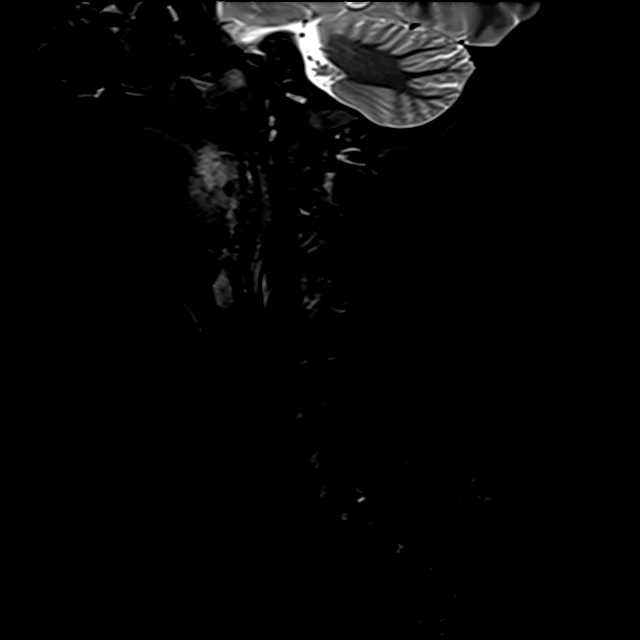
[im 3/14]
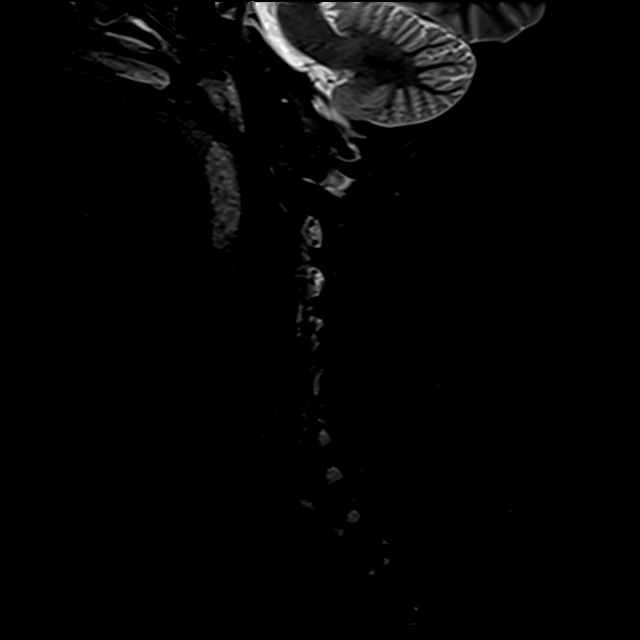
[im 6/14]
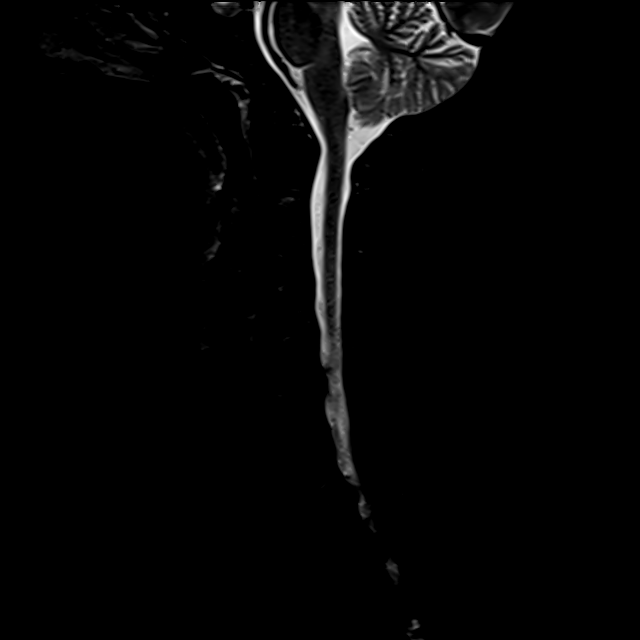
[im 8/14]
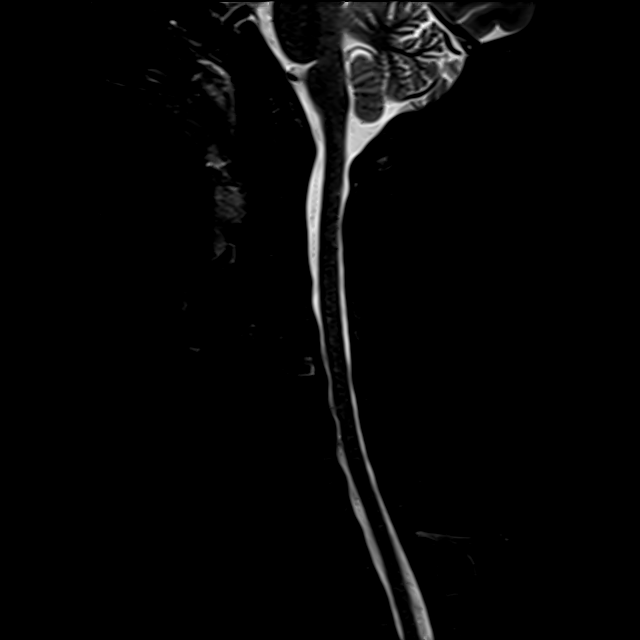
[im 11/14]
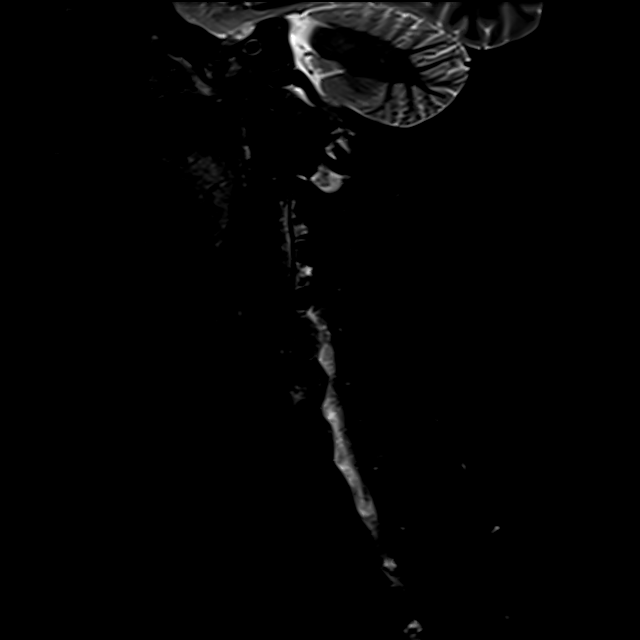
[im 14/14]
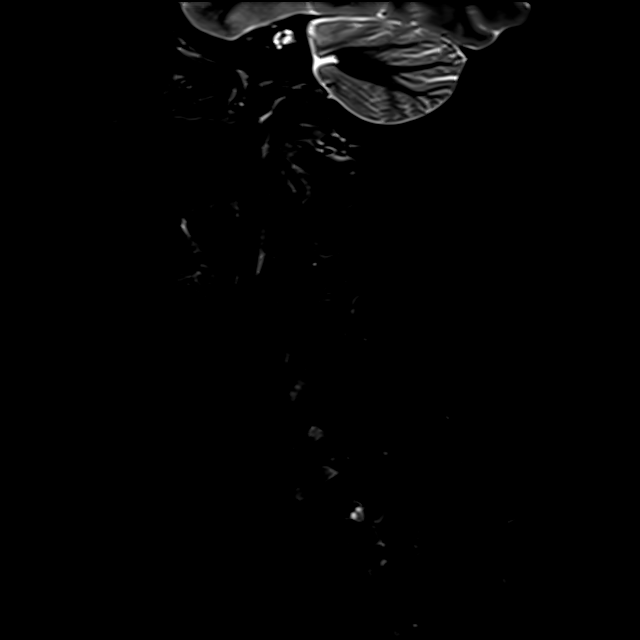

[Series 8: T2 · sagittal · 3.0mm · 0.69mm/px · 6 of 14 slices shown (1 of 2)]
[im 1/14]
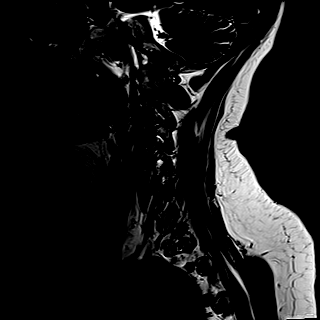
[im 3/14]
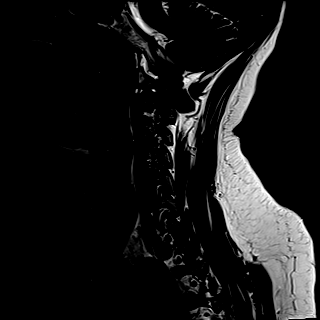
[im 6/14]
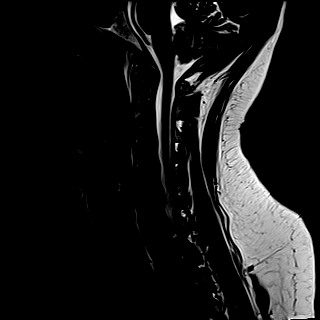
[im 8/14]
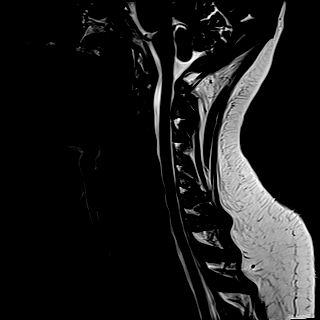
[im 11/14]
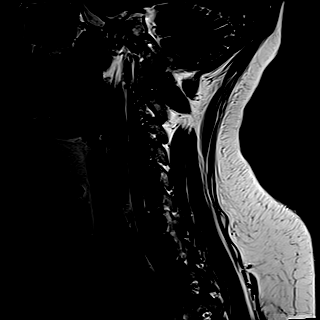
[im 14/14]
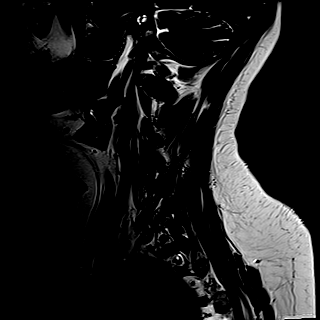

[Series 9: T2 · axial · 3.0mm · 0.70mm/px · z∈[-42,+62]mm · 9 of 33 slices shown (2 of 2)]
[im 1/33]
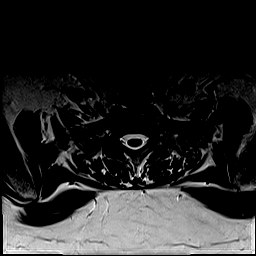
[im 5/33]
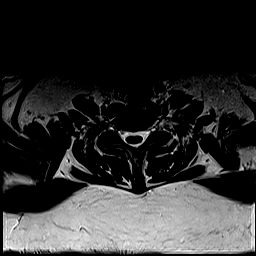
[im 10/33]
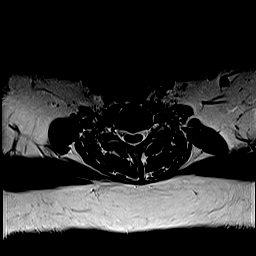
[im 14/33]
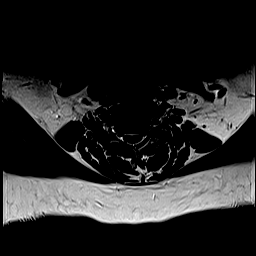
[im 17/33]
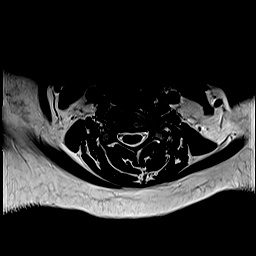
[im 19/33]
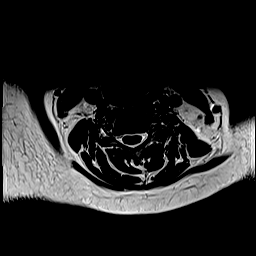
[im 23/33]
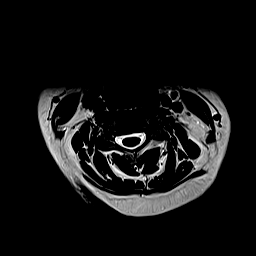
[im 28/33]
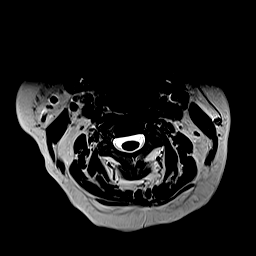
[im 33/33]
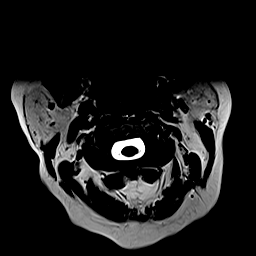

[Series 10: GRE · axial · 3.0mm · 0.35mm/px · z∈[-42,-29]mm · 2 of 33 slices shown]
[im 1/33]
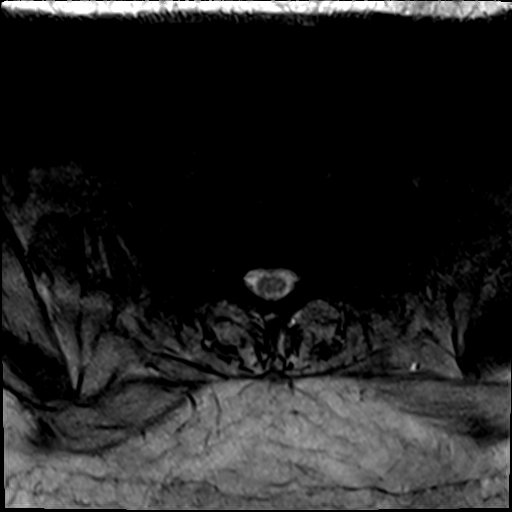
[im 5/33]
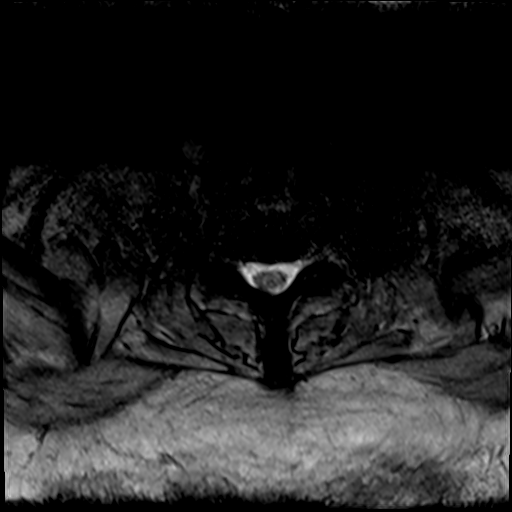

[29 of 48 positions shown; findings below may reference images not displayed]

FINDINGS: Alignment: Slight cervical levocurvature. Straightening of the
expected cervical lordosis. No significant spondylolisthesis.

Vertebrae: Vertebral body height is maintained. Moderate
degenerative endplate edema at C5-C6.

Cord: No signal abnormality identified within the cervical spinal
cord.

Posterior Fossa, vertebral arteries, paraspinal tissues: No
abnormality identified within included portions of the posterior
fossa. Flow voids preserved within the imaged cervical vertebral
arteries. Paraspinal soft tissues unremarkable.

Disc levels:

Mild multilevel disc degeneration, greatest at C5-C6.

C2-C3: No significant disc herniation or stenosis.

C3-C4: Shallow disc bulge. Facet arthrosis on the left. No
significant spinal canal or foraminal stenosis.

C4-C5: Shallow disc bulge. Uncovertebral hypertrophy (greater on the
right). Facet arthrosis on the left. No significant spinal canal
stenosis. Bilateral neural foraminal narrowing (mild/moderate right,
mild left

C5-C6: Disc bulge with bilateral disc osteophyte ridge/uncinate
hypertrophy. Facet arthrosis. Mild spinal canal narrowing (without
spinal cord mass effect). Bilateral neural foraminal narrowing
(moderate right, moderate/severe left).

C6-C7: Small central disc protrusion. Mild spinal canal narrowing
(without significant spinal cord mass effect). No significant
foraminal stenosis.

C7-T1: Small central disc protrusion. No significant spinal canal or
foraminal stenosis.
IMPRESSION: Cervical spondylosis, as outlined. No more than mild spinal canal
narrowing. Multilevel neural foraminal narrowing, as detailed and
greatest on the right at C4-C5 (mild/moderate) and bilaterally at
C5-C6 (moderate right, moderate/severe left).

Mild multilevel disc degeneration, greatest at C5-C6 (with
associated moderate degenerative endplate edema at this level).

Straightening of the expected cervical lordosis.

Mild cervical levocurvature.

## 2021-12-09 NOTE — Progress Notes (Signed)
MRI cervical spine shows some areas of arthritis and potential pinched nerves. There is some facet arthritis at C5-C6 medium on the right and medium to severe on the left that are potential targets for injection.   We will discuss this in further detail at your follow-up visit with me on January 12 or we can plan for next steps injection

## 2021-12-11 ENCOUNTER — Ambulatory Visit: Payer: 59 | Attending: Family Medicine | Admitting: Audiologist

## 2021-12-11 ENCOUNTER — Other Ambulatory Visit: Payer: Self-pay

## 2021-12-11 DIAGNOSIS — H9192 Unspecified hearing loss, left ear: Secondary | ICD-10-CM | POA: Diagnosis present

## 2021-12-11 DIAGNOSIS — H9313 Tinnitus, bilateral: Secondary | ICD-10-CM | POA: Diagnosis present
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
## 2021-12-12 ENCOUNTER — Other Ambulatory Visit: Payer: Self-pay

## 2021-12-12 ENCOUNTER — Ambulatory Visit (INDEPENDENT_AMBULATORY_CARE_PROVIDER_SITE_OTHER): Payer: 59 | Admitting: Family Medicine

## 2021-12-12 VITALS — BP 132/84 | HR 95 | Ht 65.0 in | Wt 270.8 lb

## 2021-12-12 DIAGNOSIS — H9313 Tinnitus, bilateral: Secondary | ICD-10-CM

## 2021-12-12 DIAGNOSIS — M542 Cervicalgia: Secondary | ICD-10-CM

## 2021-12-12 DIAGNOSIS — S060X0D Concussion without loss of consciousness, subsequent encounter: Secondary | ICD-10-CM

## 2021-12-12 NOTE — Patient Instructions (Addendum)
Thank you for coming in today.   Please call [facility] at [phone] to schedule your spine injection.    Let me know how you feel after the injection
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
## 2021-12-17 DIAGNOSIS — Z1231 Encounter for screening mammogram for malignant neoplasm of breast: Secondary | ICD-10-CM

## 2021-12-18 NOTE — Telephone Encounter (Signed)
Do not see recent CPE you did see her in December for DM follow up. Ok pt place order?

## 2021-12-20 ENCOUNTER — Other Ambulatory Visit: Payer: Self-pay

## 2021-12-20 ENCOUNTER — Ambulatory Visit
Admission: RE | Admit: 2021-12-20 | Discharge: 2021-12-20 | Disposition: A | Discharge status: Home / Self Care | Payer: 59 | Source: Ambulatory Visit | Attending: Family Medicine | Admitting: Family Medicine

## 2021-12-20 DIAGNOSIS — M542 Cervicalgia: Secondary | ICD-10-CM

## 2021-12-20 MED ORDER — TRIAMCINOLONE ACETONIDE 40 MG/ML IJ SUSP (RADIOLOGY)
60.0000 mg | Freq: Once | INTRAMUSCULAR | Status: AC
  Administered 2021-12-20: 60 mg via EPIDURAL

## 2021-12-20 MED ORDER — IOPAMIDOL (ISOVUE-M 300) INJECTION 61%
1.0000 mL | Freq: Once | INTRAMUSCULAR | Status: AC
  Administered 2021-12-20: 1 mL via EPIDURAL

## 2021-12-20 NOTE — Discharge Instructions (Signed)

## 2021-12-25 ENCOUNTER — Other Ambulatory Visit: Payer: Self-pay | Admitting: Internal Medicine

## 2021-12-25 DIAGNOSIS — Z1231 Encounter for screening mammogram for malignant neoplasm of breast: Secondary | ICD-10-CM

## 2021-12-30 ENCOUNTER — Other Ambulatory Visit: Payer: Self-pay | Admitting: Primary Care

## 2021-12-30 DIAGNOSIS — F411 Generalized anxiety disorder: Secondary | ICD-10-CM

## 2021-12-30 MED ORDER — HYDROXYZINE HCL 10 MG PO TABS: 10.0000 mg | ORAL_TABLET | Freq: Two times a day (BID) | ORAL | 0 refills | Status: DC | PRN

## 2021-12-31 ENCOUNTER — Encounter: Payer: Self-pay | Admitting: Family Medicine

## 2021-12-31 DIAGNOSIS — M5412 Radiculopathy, cervical region: Secondary | ICD-10-CM

## 2022-01-05 ENCOUNTER — Telehealth: Payer: 59 | Admitting: Family

## 2022-01-05 DIAGNOSIS — J019 Acute sinusitis, unspecified: Secondary | ICD-10-CM

## 2022-01-05 MED ORDER — AMOXICILLIN-POT CLAVULANATE 875-125 MG PO TABS: 1.0000 | ORAL_TABLET | Freq: Two times a day (BID) | ORAL | 0 refills | Status: DC

## 2022-01-05 NOTE — Progress Notes (Signed)

## 2022-01-08 ENCOUNTER — Telehealth: Payer: 59 | Admitting: Physician Assistant

## 2022-01-08 DIAGNOSIS — J4521 Mild intermittent asthma with (acute) exacerbation: Secondary | ICD-10-CM

## 2022-01-08 NOTE — Progress Notes (Signed)
Based on what you shared with me, I feel your condition warrants further evaluation and I recommend that you be seen for a face to face visit.  Please contact your primary care physician practice to be seen. Many offices offer virtual options to be seen via video if you are not comfortable going in person to a medical facility at this time.  Unfortunately, with your last A1c being 9.1 and symptoms worsening despite being on an antibiotic for 2-3 days, I highly recommend you be evaluated in person for further treatment considerations. I do not feel comfortable or safe prescribing a steroid at this time as it could cause you to develop diabetic ketoacidosis, which can be deadly.   NOTE: You will NOT be charged for this eVisit.  If you do not have a PCP, [facility] offers a free physician referral service available at [phone]. Our trained staff has the experience, knowledge and resources to put you in touch with a physician who is right for you.   If you are having a true medical emergency please call 911.      For an urgent face to face visit, [facility] has six urgent care centers for your convenience:     [facility] [facility] [place] Get Driving Directions [phone] [address], [place] [address]    [facility]) Get Driving Directions [phone] [place], [place] [address]  [facility]) Get Driving Directions [phone] [address] [place],  [place]  [address]  [facility] Get Driving Directions [phone] [place], [place], [place] [address]   [facility] Get Driving Directions  [phone] [address] [place], [place] [address]   [facility] at  Get Driving Directions [phone] [address], [place], [place] [address]  Your MyChart E-visit questionnaire answers were reviewed by a board certified advanced clinical practitioner to complete your personal care plan based on your specific symptoms.  Thank you for using e-Visits.    I provided 5 minutes of non face-to-face time during this encounter for chart review and documentation.

## 2022-01-13 ENCOUNTER — Ambulatory Visit
Admission: RE | Admit: 2022-01-13 | Discharge: 2022-01-13 | Disposition: A | Discharge status: Home / Self Care | Payer: 59 | Source: Ambulatory Visit | Attending: Internal Medicine | Admitting: Internal Medicine

## 2022-01-13 ENCOUNTER — Other Ambulatory Visit: Payer: Self-pay

## 2022-01-13 DIAGNOSIS — Z1231 Encounter for screening mammogram for malignant neoplasm of breast: Secondary | ICD-10-CM

## 2022-01-13 IMAGING — MG MM DIGITAL SCREENING BILAT W/ TOMO AND CAD
6 of 10 series · 6 of 30 positions shown · non-contrast
Comparison: Previous exam(s).

CLINICAL DATA: Screening.

EXAM:
DIGITAL SCREENING BILATERAL MAMMOGRAM WITH TOMOSYNTHESIS AND CAD
TECHNIQUE: Bilateral screening digital craniocaudal and mediolateral oblique
mammograms were obtained. Bilateral screening digital breast
tomosynthesis was performed. The images were evaluated with
computer-aided detection.

[L MLO synth-2D]
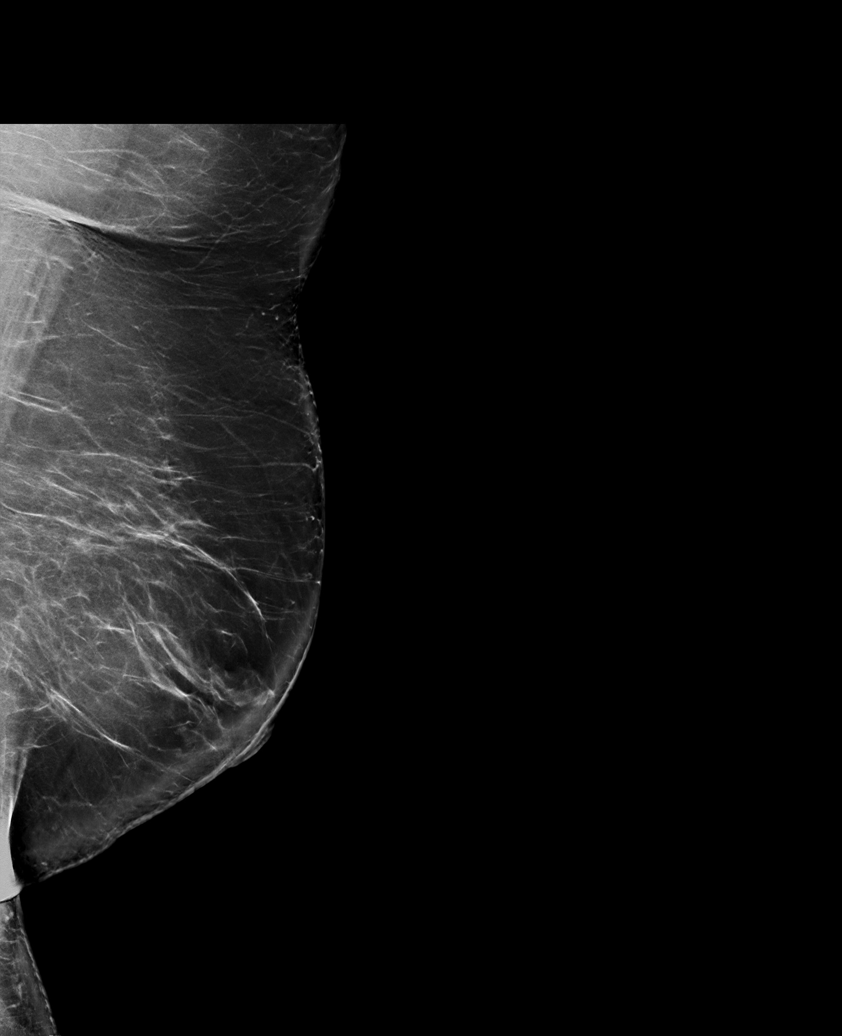

[L CC synth-2D (1 of 2)]
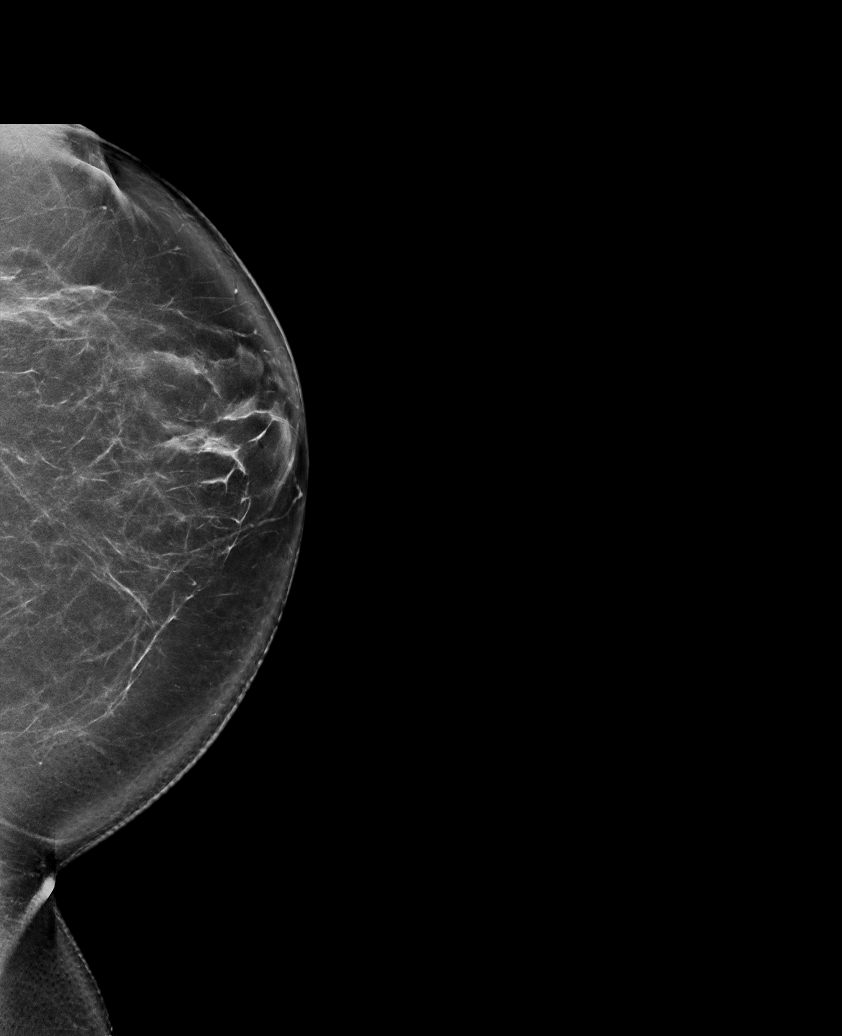

[R MLO synth-2D]
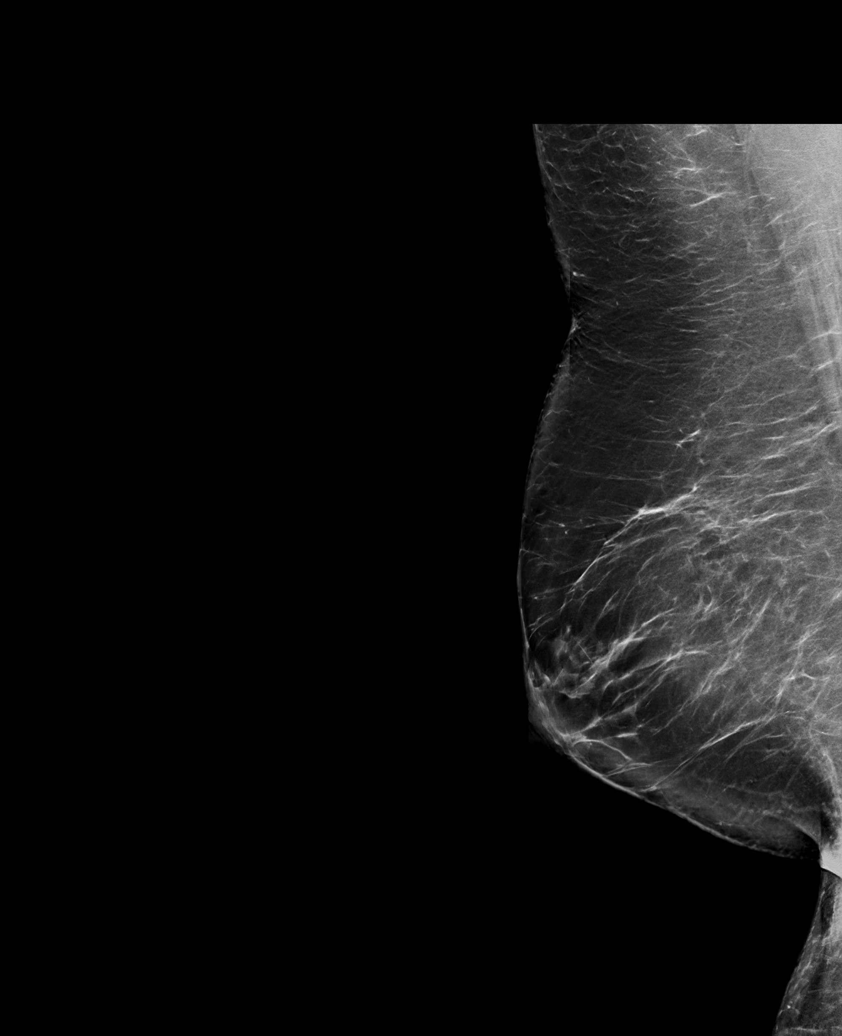

[R CC synth-2D]
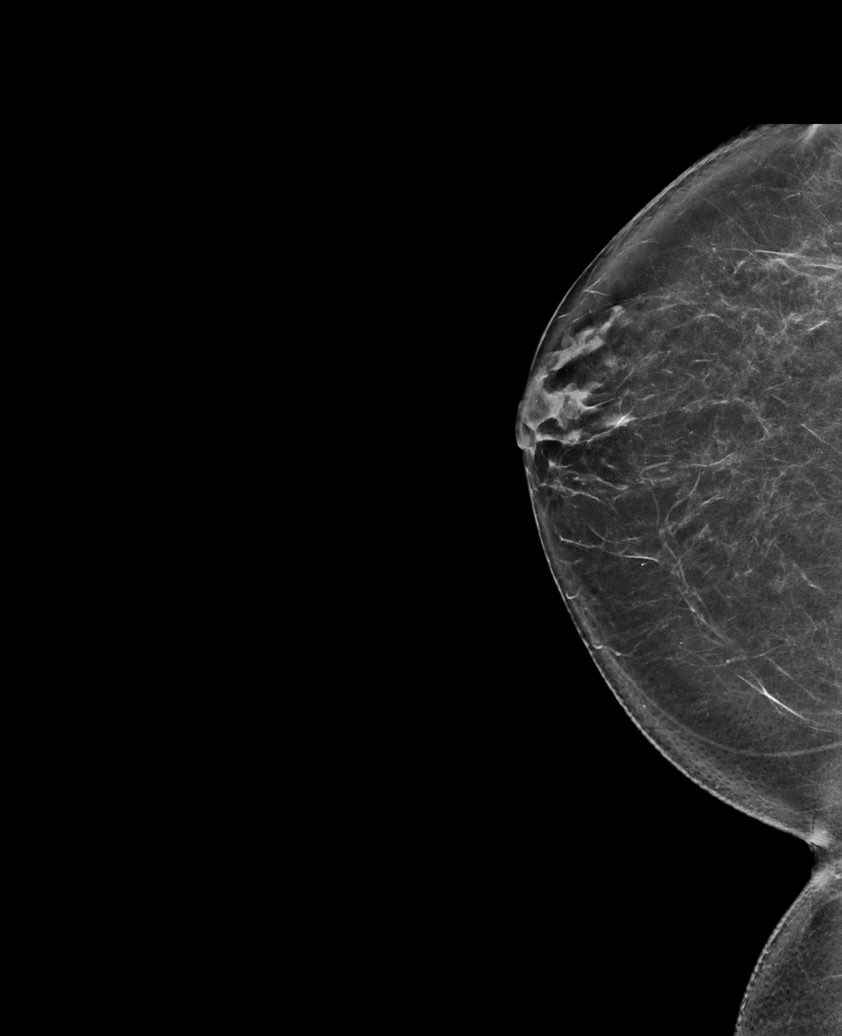

[L CC synth-2D (2 of 2)]
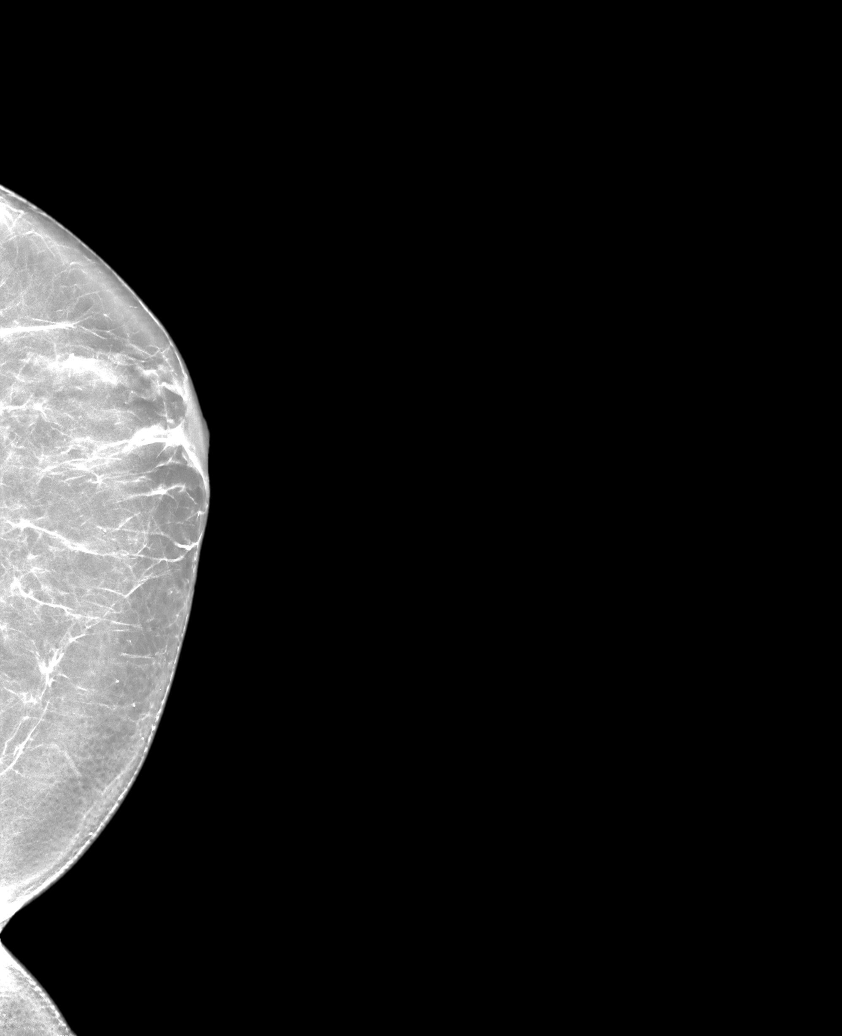

[L CC tomo · tomo slice 46/91.0]
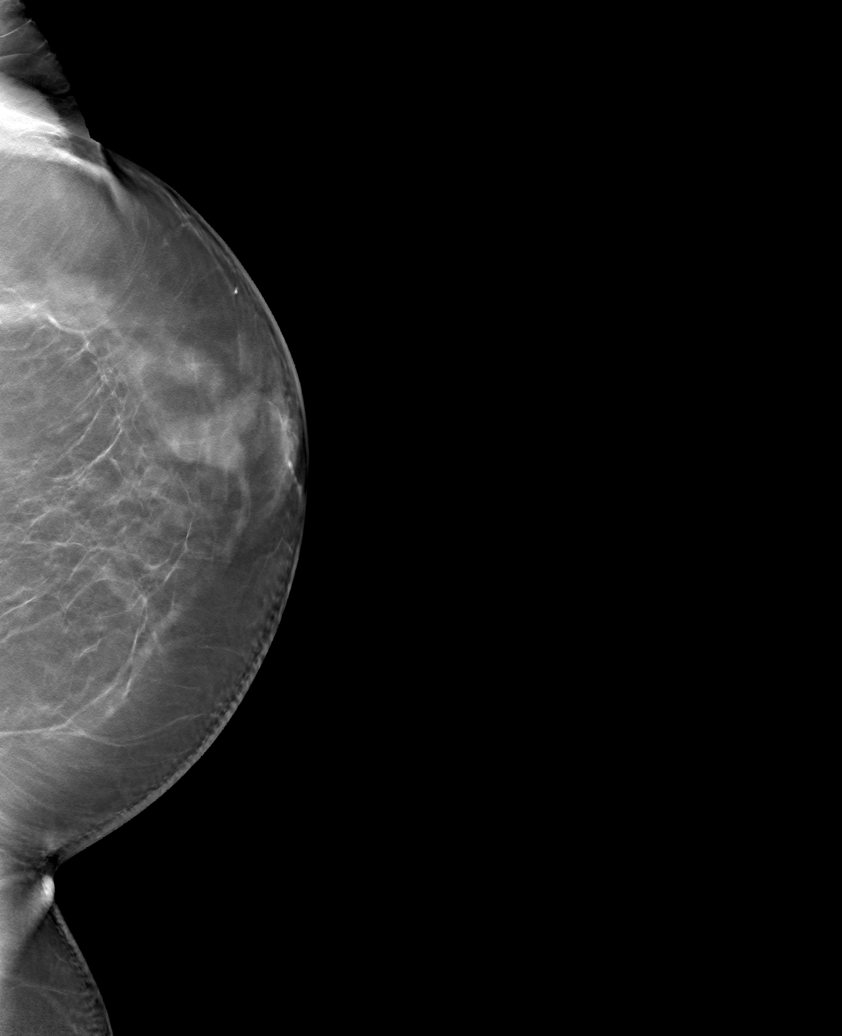

[6 of 30 positions shown; findings below may reference images not displayed]

ACR Breast Density Category b: There are scattered areas of
fibroglandular density.
FINDINGS: There are no findings suspicious for malignancy.
IMPRESSION: No mammographic evidence of malignancy. A result letter of this
screening mammogram will be mailed directly to the patient.

RECOMMENDATION:
Screening mammogram in one year. (Code:[BY])

BI-RADS CATEGORY  1: Negative.

## 2022-01-16 ENCOUNTER — Ambulatory Visit
Admission: RE | Admit: 2022-01-16 | Discharge: 2022-01-16 | Disposition: A | Discharge status: Home / Self Care | Payer: 59 | Source: Ambulatory Visit | Attending: Family Medicine | Admitting: Family Medicine

## 2022-01-16 ENCOUNTER — Other Ambulatory Visit: Payer: Self-pay

## 2022-01-16 DIAGNOSIS — M5412 Radiculopathy, cervical region: Secondary | ICD-10-CM

## 2022-01-16 MED ORDER — TRIAMCINOLONE ACETONIDE 40 MG/ML IJ SUSP (RADIOLOGY)
60.0000 mg | Freq: Once | INTRAMUSCULAR | Status: AC
  Administered 2022-01-16: 60 mg via EPIDURAL

## 2022-01-16 MED ORDER — IOPAMIDOL (ISOVUE-M 300) INJECTION 61%
1.0000 mL | Freq: Once | INTRAMUSCULAR | Status: AC
  Administered 2022-01-16: 1 mL via EPIDURAL

## 2022-01-16 NOTE — Discharge Instructions (Signed)

## 2022-01-28 ENCOUNTER — Encounter: Payer: Self-pay | Admitting: Family Medicine

## 2022-01-28 DIAGNOSIS — M5412 Radiculopathy, cervical region: Secondary | ICD-10-CM

## 2022-02-06 ENCOUNTER — Ambulatory Visit
Admission: RE | Admit: 2022-02-06 | Discharge: 2022-02-06 | Disposition: A | Discharge status: Home / Self Care | Payer: 59 | Source: Ambulatory Visit | Attending: Family Medicine | Admitting: Family Medicine

## 2022-02-06 ENCOUNTER — Other Ambulatory Visit: Payer: Self-pay

## 2022-02-06 DIAGNOSIS — M5412 Radiculopathy, cervical region: Secondary | ICD-10-CM

## 2022-02-06 IMAGING — XA DG INJECT/[PERSON_NAME] INC NEEDLE/CATH/PLC EPI/CERV/THOR W/IMG
2 series · 2 of 2 positions shown · non-contrast
Comparison: none

CLINICAL DATA: Spondylosis without myelopathy. Improvement
following the first 2 shots but with some persistent symptoms.
Patient reports better response from the C7-T1 injection, therefore
that level was utilized today.

[Series 1: ortho standard · 1 of 1 slices shown (1 of 2)]
[im 1/1]
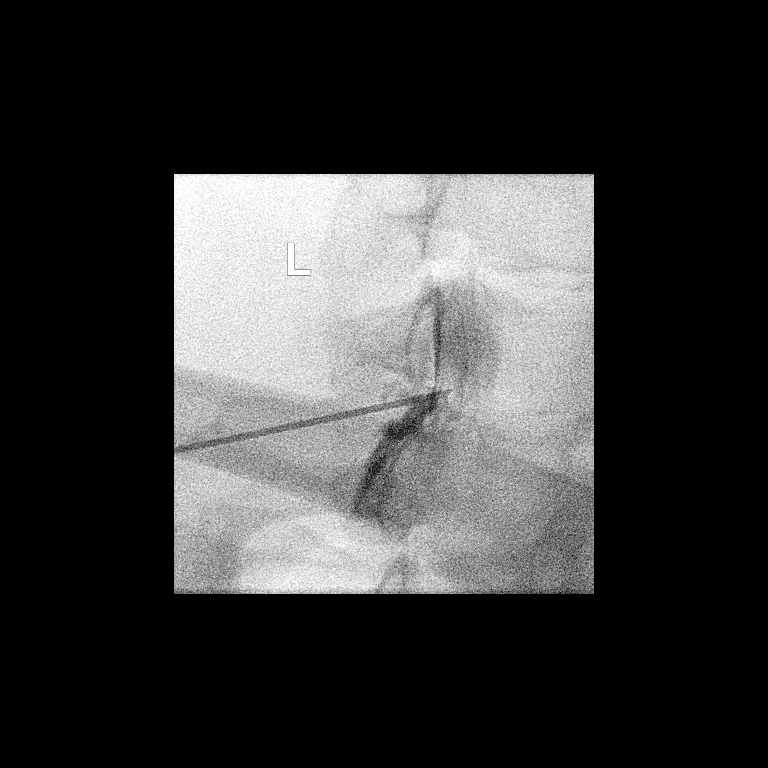

[Series 2: ortho standard · 1 of 1 slices shown (2 of 2)]
[im 1/1]
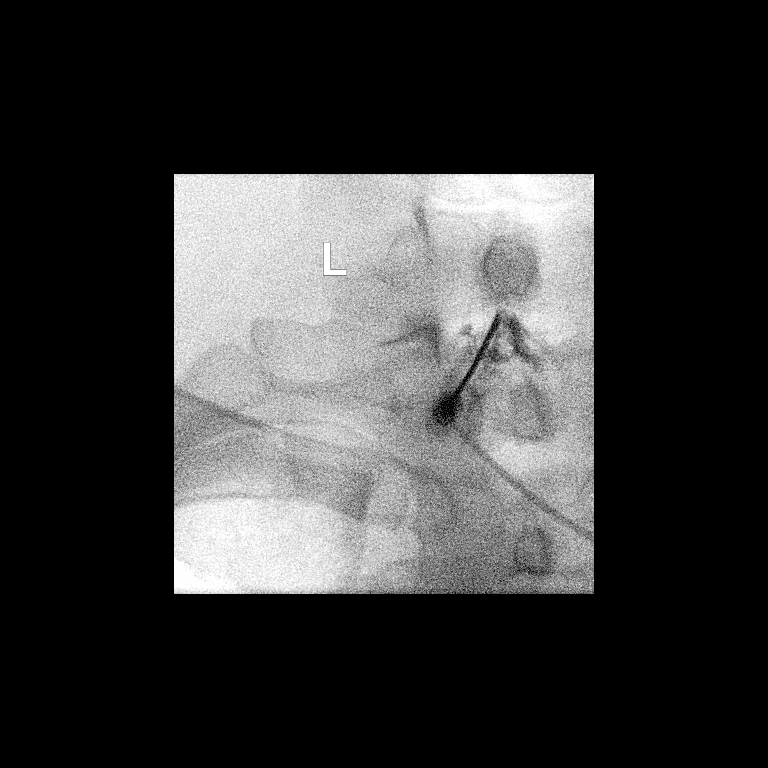

[2 of 2 positions shown; findings below may reference images not displayed]

FLUOROSCOPY:
Radiation Exposure Index (as provided by the fluoroscopic device): 0
minutes 33 seconds. 12.17 micro gray meter squared

PROCEDURE:
CERVICAL EPIDURAL INJECTION

An interlaminar approach was performed on the left at C7-T1. A 20
gauge epidural needle was advanced using loss-of-resistance
technique.

DIAGNOSTIC EPIDURAL INJECTION

Injection of Isovue-M 300 shows a good epidural pattern with spread
above and below the level of needle placement, primarily on the
left. No vascular opacification is seen. THERAPEUTIC

EPIDURAL INJECTION

1.5 ml of Kenalog 40 mixed with 1 ml of 1% Lidocaine and 2 ml of
normal saline were then instilled. The procedure was well-tolerated,
and the patient was discharged thirty minutes following the
injection in good condition.
IMPRESSION: Technically successful final epidural injection on the left at
C7-T1. C7-T1 was chosen because the patient maintains that she got
better response from the prior injection at that level.

## 2022-02-06 MED ORDER — IOPAMIDOL (ISOVUE-M 300) INJECTION 61%
1.0000 mL | Freq: Once | INTRAMUSCULAR | Status: AC
  Administered 2022-02-06: 1 mL via EPIDURAL

## 2022-02-06 MED ORDER — TRIAMCINOLONE ACETONIDE 40 MG/ML IJ SUSP (RADIOLOGY)
60.0000 mg | Freq: Once | INTRAMUSCULAR | Status: AC
  Administered 2022-02-06: 60 mg via EPIDURAL

## 2022-02-06 NOTE — Discharge Instructions (Signed)

## 2022-02-11 ENCOUNTER — Ambulatory Visit: Payer: 59 | Admitting: Primary Care

## 2022-02-11 ENCOUNTER — Encounter: Payer: Self-pay | Admitting: Family Medicine

## 2022-02-11 ENCOUNTER — Encounter: Payer: Self-pay | Admitting: Primary Care

## 2022-02-11 ENCOUNTER — Other Ambulatory Visit: Payer: Self-pay

## 2022-02-11 ENCOUNTER — Other Ambulatory Visit: Payer: Self-pay | Admitting: Family Medicine

## 2022-02-11 VITALS — BP 110/78 | HR 83 | Temp 99.0°F | Ht 65.0 in | Wt 266.0 lb

## 2022-02-11 DIAGNOSIS — E1165 Type 2 diabetes mellitus with hyperglycemia: Secondary | ICD-10-CM | POA: Diagnosis not present

## 2022-02-11 LAB — HM DIABETES EYE EXAM

## 2022-02-11 LAB — POCT GLYCOSYLATED HEMOGLOBIN (HGB A1C): Hemoglobin A1C: 7.7 % — AB (ref 4.0–5.6)

## 2022-02-11 MED ORDER — SEMAGLUTIDE (1 MG/DOSE) 4 MG/3ML ~~LOC~~ SOPN: 1.0000 mg | PEN_INJECTOR | SUBCUTANEOUS | 0 refills | Status: DC

## 2022-02-11 NOTE — Assessment & Plan Note (Signed)
Improved! ? ?She has undergone several cervical spine steroidal  injections since our last visit so I suspect A1C would have been better if she had not had to undergo these injections.  ? ?She is involved with a fertility specialist for IVF, will need to be more aggressive on glucose management. Increase Ozempic to 1 mg daily, she agrees. Continue metformin 1000 mg BID. ? ?We will plan to see her back in 3 months for diabetes follow up.  ?
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
## 2022-02-11 NOTE — Patient Instructions (Addendum)
We increased the dose of your Ozempic to 1 mg weekly. I sent a new prescription to your pharmacy. ? ?Continue taking metformin 1000 mg twice daily. ? ?Please schedule a follow up visit for 3 months for your physical. ? ?It was a pleasure to see you today! ? ?

## 2022-02-12 ENCOUNTER — Other Ambulatory Visit: Payer: Self-pay | Admitting: Physical Therapy

## 2022-02-12 MED ORDER — TIZANIDINE HCL 4 MG PO TABS: 4.0000 mg | ORAL_TABLET | Freq: Three times a day (TID) | ORAL | 3 refills | Status: DC | PRN

## 2022-02-27 ENCOUNTER — Other Ambulatory Visit: Payer: Self-pay | Admitting: Primary Care

## 2022-02-27 ENCOUNTER — Encounter: Payer: Self-pay | Admitting: Family Medicine

## 2022-02-27 DIAGNOSIS — E1165 Type 2 diabetes mellitus with hyperglycemia: Secondary | ICD-10-CM

## 2022-02-28 MED ORDER — PROMETHAZINE HCL 25 MG PO TABS: 25.0000 mg | ORAL_TABLET | Freq: Four times a day (QID) | ORAL | 3 refills | Status: DC | PRN

## 2022-03-11 ENCOUNTER — Encounter: Payer: Self-pay | Admitting: Family Medicine

## 2022-03-11 DIAGNOSIS — M542 Cervicalgia: Secondary | ICD-10-CM

## 2022-03-11 DIAGNOSIS — M5412 Radiculopathy, cervical region: Secondary | ICD-10-CM

## 2022-03-20 DIAGNOSIS — N979 Female infertility, unspecified: Secondary | ICD-10-CM

## 2022-03-25 ENCOUNTER — Other Ambulatory Visit (INDEPENDENT_AMBULATORY_CARE_PROVIDER_SITE_OTHER): Payer: 59

## 2022-03-25 ENCOUNTER — Telehealth: Payer: Self-pay

## 2022-03-25 DIAGNOSIS — N979 Female infertility, unspecified: Secondary | ICD-10-CM | POA: Diagnosis not present

## 2022-03-25 LAB — COMPREHENSIVE METABOLIC PANEL
ALT: 48 U/L — ABNORMAL HIGH (ref 0–35)
AST: 28 U/L (ref 0–37)
Albumin: 4 g/dL (ref 3.5–5.2)
Alkaline Phosphatase: 64 U/L (ref 39–117)
BUN: 6 mg/dL (ref 6–23)
CO2: 25 mEq/L (ref 19–32)
Calcium: 9.3 mg/dL (ref 8.4–10.5)
Chloride: 101 mEq/L (ref 96–112)
Creatinine, Ser: 0.59 mg/dL (ref 0.40–1.20)
GFR: 108.52 mL/min (ref >60.00)
Glucose, Bld: 150 mg/dL — ABNORMAL HIGH (ref 70–99)
Potassium: 4.2 mEq/L (ref 3.5–5.1)
Sodium: 136 mEq/L (ref 135–145)
Total Bilirubin: 0.4 mg/dL (ref 0.2–1.2)
Total Protein: 6.8 g/dL (ref 6.0–8.3)

## 2022-03-25 LAB — HEMOGLOBIN A1C: Hgb A1c MFr Bld: 7.3 % — ABNORMAL HIGH (ref 4.6–6.5)

## 2022-03-31 ENCOUNTER — Other Ambulatory Visit: Payer: Self-pay | Admitting: Neurological Surgery

## 2022-04-01 ENCOUNTER — Other Ambulatory Visit: Payer: Self-pay | Admitting: Neurological Surgery

## 2022-04-01 DIAGNOSIS — G8929 Other chronic pain: Secondary | ICD-10-CM

## 2022-04-08 ENCOUNTER — Ambulatory Visit
Admission: RE | Admit: 2022-04-08 | Discharge: 2022-04-08 | Disposition: A | Discharge status: Home / Self Care | Payer: 59 | Source: Ambulatory Visit | Attending: Neurological Surgery | Admitting: Neurological Surgery

## 2022-04-08 DIAGNOSIS — G8929 Other chronic pain: Secondary | ICD-10-CM

## 2022-04-08 IMAGING — MR MR SHOULDER*L* W/O CM
5 series · 40 of 40 positions shown · non-contrast
Comparison: X-ray shoulder [DATE].

CLINICAL DATA: Left shoulder pain with numbness and limited range
of motion related to an injury since, [DATE] .

EXAM:
MRI OF THE LEFT SHOULDER WITHOUT CONTRAST
TECHNIQUE: Multiplanar, multisequence MR imaging of the shoulder was performed.
No intravenous contrast was administered.

[Series 3: T2 fat-sat · axial · left · 3.0mm · 0.47mm/px · z∈[-105,-14]mm · 8 of 27 slices shown (1 of 3)]
[im 1/27]
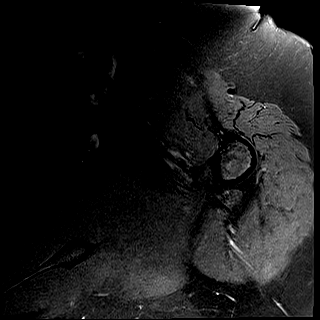
[im 4/27]
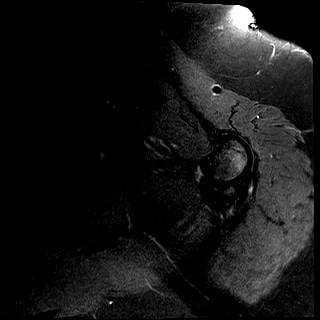
[im 8/27]
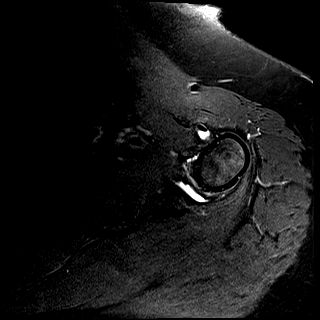
[im 12/27]
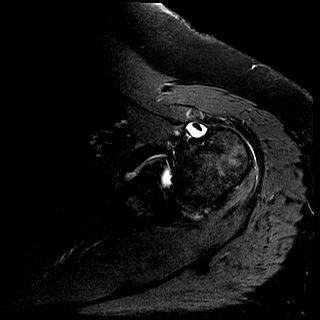
[im 15/27]
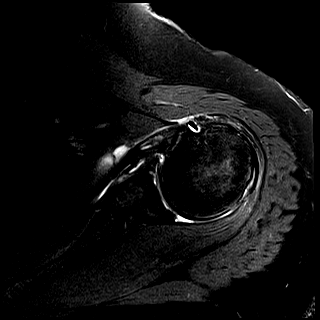
[im 19/27]
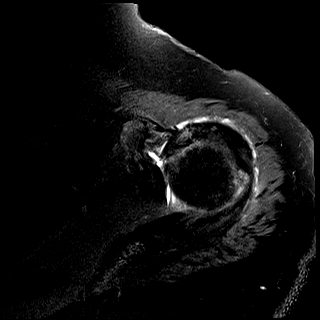
[im 23/27]
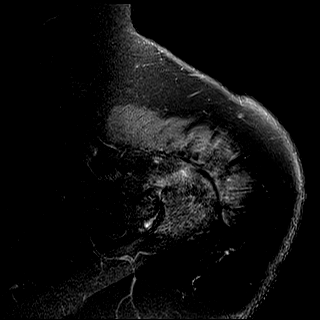
[im 27/27]
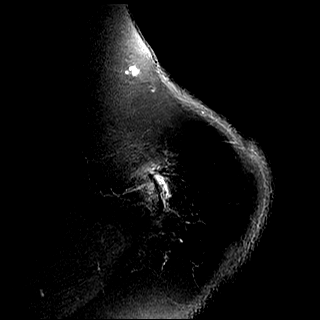

[Series 4: T2 fat-sat · oblique · left · 3.0mm · 0.44mm/px · 8 of 28 slices shown (2 of 3)]
[im 1/28]
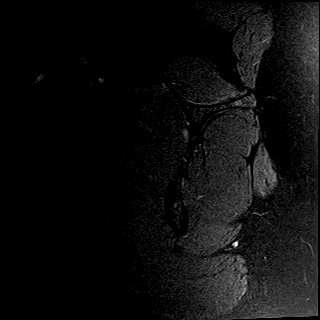
[im 4/28]
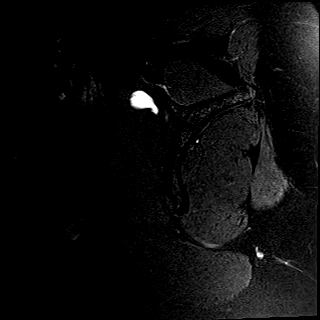
[im 8/28]
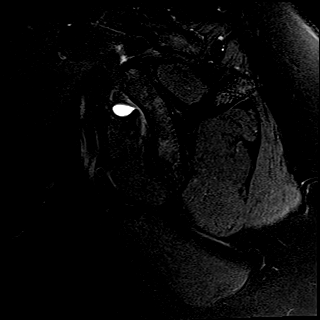
[im 12/28]
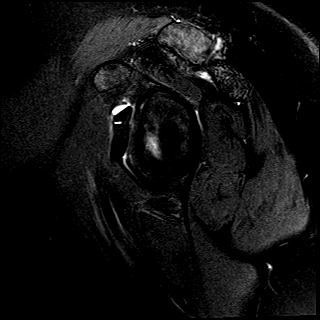
[im 16/28]
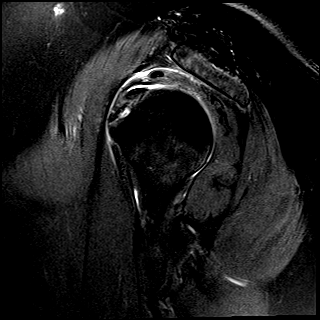
[im 20/28]
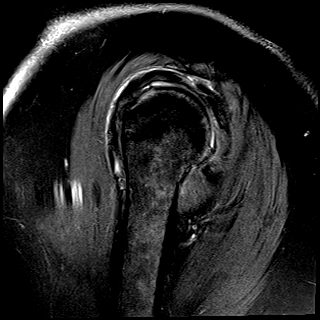
[im 24/28]
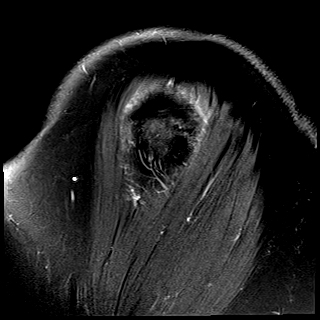
[im 28/28]
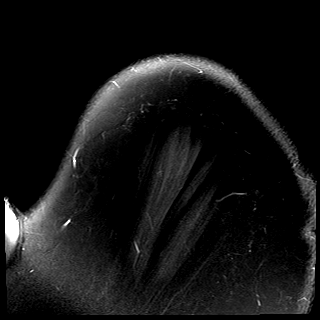

[Series 5: T1 · oblique · left · 3.0mm · 0.55mm/px · 8 of 28 slices shown]
[im 1/28]
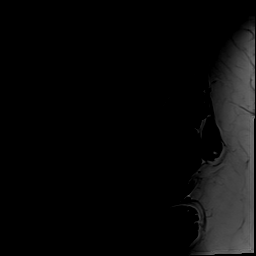
[im 4/28]
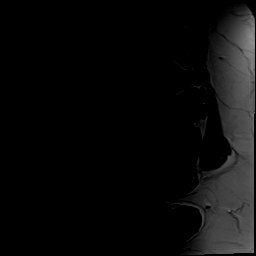
[im 8/28]
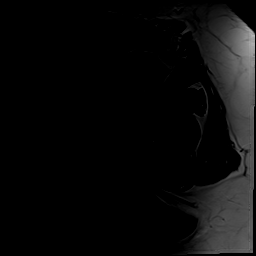
[im 12/28]
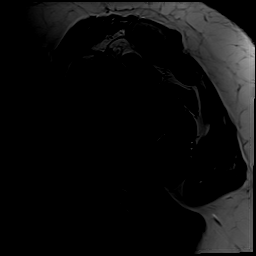
[im 16/28]
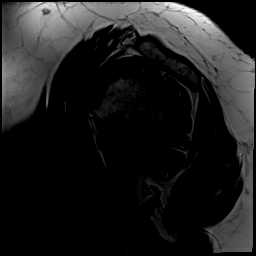
[im 20/28]
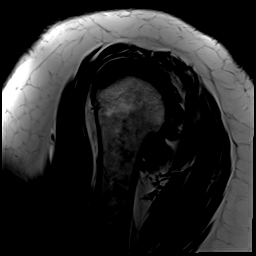
[im 24/28]
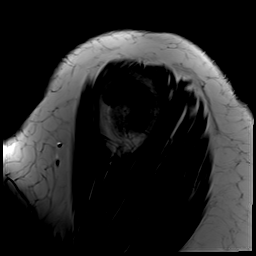
[im 28/28]
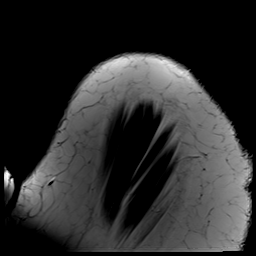

[Series 100: PD · oblique · left · 3.0mm · 0.44mm/px · 8 of 26 slices shown]
[im 1/26]
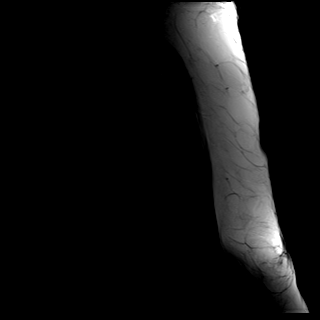
[im 4/26]
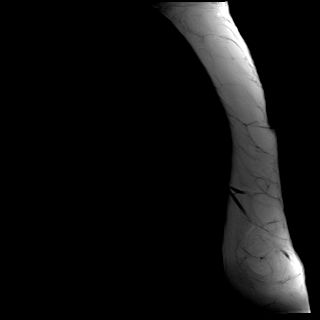
[im 8/26]
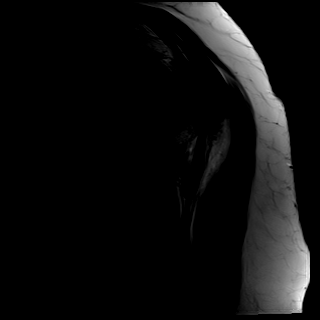
[im 11/26]
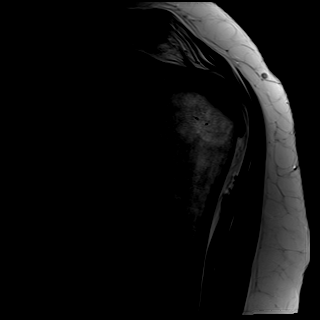
[im 15/26]
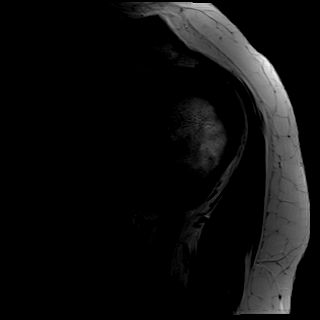
[im 18/26]
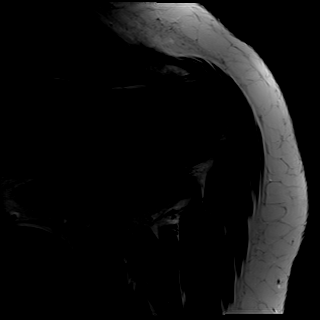
[im 22/26]
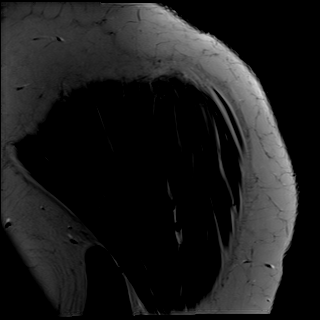
[im 26/26]
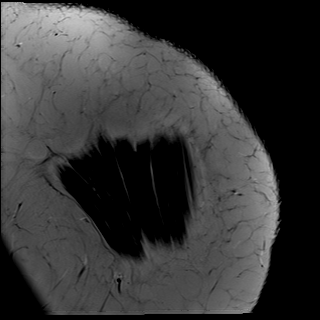

[Series 101: T2 fat-sat · oblique · left · 3.0mm · 0.44mm/px · 8 of 26 slices shown (3 of 3)]
[im 1/26]
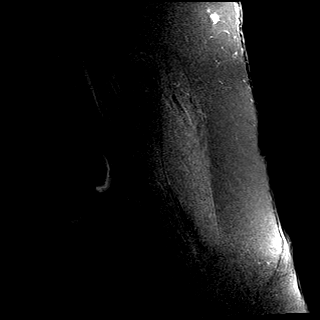
[im 4/26]
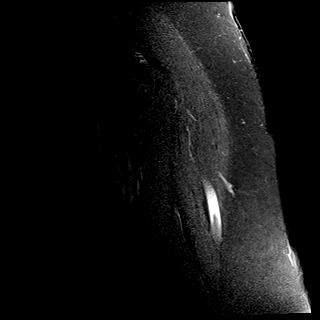
[im 8/26]
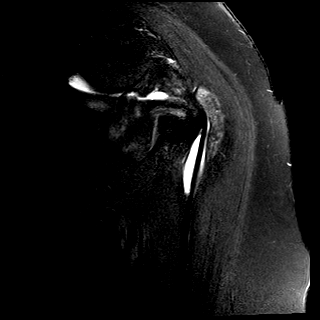
[im 11/26]
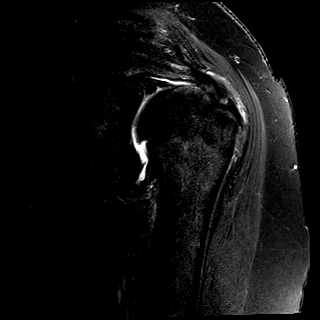
[im 15/26]
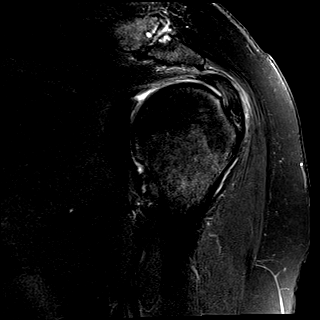
[im 18/26]
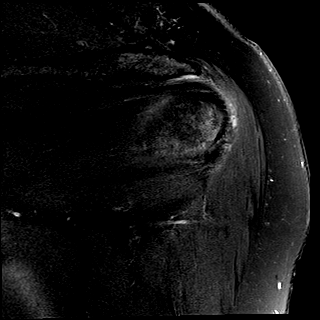
[im 22/26]
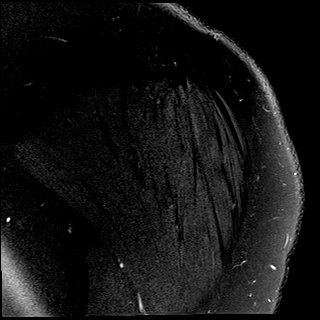
[im 26/26]
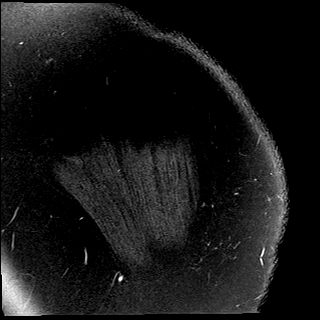

[40 of 40 positions shown; findings below may reference images not displayed]

FINDINGS: Rotator cuff: Significant tendinopathy/tendinosis involving the
supraspinatus tendon which is thickened and appears edematous with
interstitial tears. No full-thickness retracted rotator cuff tear.
The infraspinatus and subscapularis tendons are intact. Mild
tendinopathy.

Muscles:  Mild fatty change involving the shoulder musculature.

Biceps long head:  Intact

Acromioclavicular Joint: Moderate degenerative changes. Type 1
acromion. No lateral downsloping or subacromial spurring.

Glenohumeral Joint: Early degenerative changes. Small joint
effusion.

Labrum:  No definite labral tears.

Bones:  No acute bony findings.

Other: Mild subacromial/subdeltoid bursitis.
IMPRESSION: 1. Significant supraspinatus tendinopathy/tendinosis with
interstitial tears. No full-thickness retracted rotator cuff tear.
2. Intact long head biceps tendon and glenoid labrum.
3. Moderate AC joint degenerative changes but no other significant
findings for bony impingement.
4. Mild subacromial/subdeltoid bursitis.

## 2022-04-20 ENCOUNTER — Other Ambulatory Visit: Payer: Self-pay | Admitting: Family Medicine

## 2022-04-21 ENCOUNTER — Other Ambulatory Visit: Payer: Self-pay | Admitting: Primary Care

## 2022-04-21 DIAGNOSIS — E1165 Type 2 diabetes mellitus with hyperglycemia: Secondary | ICD-10-CM

## 2022-04-21 MED ORDER — SEMAGLUTIDE (1 MG/DOSE) 4 MG/3ML ~~LOC~~ SOPN: 1.0000 mg | PEN_INJECTOR | SUBCUTANEOUS | 0 refills | Status: DC

## 2022-04-24 ENCOUNTER — Other Ambulatory Visit: Payer: Self-pay | Admitting: Family Medicine

## 2022-04-25 ENCOUNTER — Other Ambulatory Visit: Payer: Self-pay

## 2022-04-25 NOTE — Telephone Encounter (Signed)
Rx refill request approved per Dr. Corey's orders. 

## 2022-04-29 ENCOUNTER — Other Ambulatory Visit: Payer: Self-pay

## 2022-04-29 ENCOUNTER — Emergency Department
Admission: EM | Admit: 2022-04-29 | Discharge: 2022-04-29 | Disposition: A | Discharge status: Home / Self Care | Payer: 59 | Attending: Emergency Medicine | Admitting: Emergency Medicine

## 2022-04-29 DIAGNOSIS — R509 Fever, unspecified: Secondary | ICD-10-CM | POA: Insufficient documentation

## 2022-04-29 DIAGNOSIS — D72829 Elevated white blood cell count, unspecified: Secondary | ICD-10-CM | POA: Diagnosis not present

## 2022-04-29 DIAGNOSIS — E119 Type 2 diabetes mellitus without complications: Secondary | ICD-10-CM | POA: Diagnosis not present

## 2022-04-29 DIAGNOSIS — R197 Diarrhea, unspecified: Secondary | ICD-10-CM | POA: Diagnosis not present

## 2022-04-29 DIAGNOSIS — Z20822 Contact with and (suspected) exposure to covid-19: Secondary | ICD-10-CM | POA: Diagnosis not present

## 2022-04-29 DIAGNOSIS — R109 Unspecified abdominal pain: Secondary | ICD-10-CM | POA: Insufficient documentation

## 2022-04-29 DIAGNOSIS — R112 Nausea with vomiting, unspecified: Secondary | ICD-10-CM | POA: Diagnosis present

## 2022-04-29 DIAGNOSIS — Z7984 Long term (current) use of oral hypoglycemic drugs: Secondary | ICD-10-CM | POA: Diagnosis not present

## 2022-04-29 LAB — URINALYSIS, ROUTINE W REFLEX MICROSCOPIC
Bilirubin Urine: NEGATIVE
Glucose, UA: NEGATIVE mg/dL
Hgb urine dipstick: NEGATIVE
Ketones, ur: NEGATIVE mg/dL
Leukocytes,Ua: NEGATIVE
Nitrite: NEGATIVE
Protein, ur: NEGATIVE mg/dL
Specific Gravity, Urine: 1.005 (ref 1.005–1.030)
pH: 9 — ABNORMAL HIGH (ref 5.0–8.0)

## 2022-04-29 LAB — CBC
HCT: 42.5 % (ref 36.0–46.0)
Hemoglobin: 13.7 g/dL (ref 12.0–15.0)
MCH: 26.4 pg (ref 26.0–34.0)
MCHC: 32.2 g/dL (ref 30.0–36.0)
MCV: 81.9 fL (ref 80.0–100.0)
Platelets: 246 10*3/uL (ref 150–400)
RBC: 5.19 MIL/uL — ABNORMAL HIGH (ref 3.87–5.11)
RDW: 14.8 % (ref 11.5–15.5)
WBC: 17.5 10*3/uL — ABNORMAL HIGH (ref 4.0–10.5)
nRBC: 0 % (ref 0.0–0.2)

## 2022-04-29 LAB — COMPREHENSIVE METABOLIC PANEL
ALT: 59 U/L — ABNORMAL HIGH (ref 0–44)
AST: 50 U/L — ABNORMAL HIGH (ref 15–41)
Albumin: 4 g/dL (ref 3.5–5.0)
Alkaline Phosphatase: 66 U/L (ref 38–126)
Anion gap: 12 (ref 5–15)
BUN: 7 mg/dL (ref 6–20)
CO2: 23 mmol/L (ref 22–32)
Calcium: 9.2 mg/dL (ref 8.9–10.3)
Chloride: 101 mmol/L (ref 98–111)
Creatinine, Ser: 0.57 mg/dL (ref 0.44–1.00)
GFR, Estimated: >60 mL/min (ref >60)
Glucose, Bld: 195 mg/dL — ABNORMAL HIGH (ref 70–99)
Potassium: 3.7 mmol/L (ref 3.5–5.1)
Sodium: 136 mmol/L (ref 135–145)
Total Bilirubin: 0.9 mg/dL (ref 0.3–1.2)
Total Protein: 7.4 g/dL (ref 6.5–8.1)

## 2022-04-29 LAB — RESP PANEL BY RT-PCR (FLU A&B, COVID) ARPGX2
Influenza A by PCR: NEGATIVE
Influenza B by PCR: NEGATIVE
SARS Coronavirus 2 by RT PCR: NEGATIVE

## 2022-04-29 LAB — POC URINE PREG, ED: Preg Test, Ur: NEGATIVE

## 2022-04-29 LAB — LIPASE, BLOOD: Lipase: 26 U/L (ref 11–51)

## 2022-04-29 MED ORDER — SODIUM CHLORIDE 0.9 % IV BOLUS
1000.0000 mL | Freq: Once | INTRAVENOUS | Status: AC
  Administered 2022-04-29: 1000 mL via INTRAVENOUS

## 2022-04-29 MED ORDER — METOCLOPRAMIDE HCL 5 MG/ML IJ SOLN
10.0000 mg | Freq: Once | INTRAMUSCULAR | Status: AC
  Administered 2022-04-29: 10 mg via INTRAVENOUS
  Filled 2022-04-29: qty 2

## 2022-04-29 MED ORDER — ONDANSETRON HCL 4 MG/2ML IJ SOLN
4.0000 mg | Freq: Once | INTRAMUSCULAR | Status: AC
Start: 2022-04-29 — End: 2022-04-29
  Administered 2022-04-29: 4 mg via INTRAVENOUS
  Filled 2022-04-29: qty 2

## 2022-04-29 MED ORDER — METOCLOPRAMIDE HCL 10 MG PO TABS: 10.0000 mg | ORAL_TABLET | Freq: Three times a day (TID) | ORAL | 0 refills | Status: DC | PRN

## 2022-04-29 MED ORDER — ACETAMINOPHEN 325 MG PO TABS
650.0000 mg | ORAL_TABLET | Freq: Once | ORAL | Status: AC
  Administered 2022-04-29: 650 mg via ORAL
  Filled 2022-04-29: qty 2

## 2022-04-29 MED ORDER — FAMOTIDINE IN NACL 20-0.9 MG/50ML-% IV SOLN
20.0000 mg | Freq: Once | INTRAVENOUS | Status: AC
  Administered 2022-04-29: 20 mg via INTRAVENOUS
  Filled 2022-04-29: qty 50

## 2022-04-29 MED ORDER — FAMOTIDINE 20 MG PO TABS: 20.0000 mg | ORAL_TABLET | Freq: Two times a day (BID) | ORAL | 0 refills | Status: DC

## 2022-04-29 NOTE — Discharge Instructions (Signed)
We suspect that you likely either have a stomach virus or gastritis.  Take the Pepcid twice daily for the next 1 to 2 weeks and use the Reglan as needed for nausea.  Drink small amounts of liquid frequently and avoid large or heavy meals for the next few days.  Follow-up with your regular doctor.  Return to the ER for new, worsening, or persistent severe vomiting, any blood in the vomit, recurrent diarrhea or blood in the stool, persistent abdominal pain, fever, weakness, or any other new or worsening symptoms that concern you.

## 2022-04-29 NOTE — ED Provider Notes (Signed)
[facility] ([facility]) Provider Note    Event Date/Time   First MD Initiated Contact with Patient 04/29/22 [phone]     (approximate)   History   Emesis   HPI  Kelli Cox is a 46 y.o. female with history of diabetes who presents with nausea and vomiting for the past 5 days, persistent course, nonbloody, and associated with some intermittent left-sided abdominal pain.  The patient states she had diarrhea initially the first few days but this has resolved.  She feels today like she has a fever.  She denies any cough or shortness of breath.  She denies any sick contacts or any recent travel.   Physical Exam   Triage Vital Signs: ED Triage Vitals  Enc Vitals Group     BP 04/29/22 0840 (!) 137/96     Pulse Rate 04/29/22 0840 (!) 130     Resp 04/29/22 0840 17     Temp 04/29/22 0840 99.9 F (37.7 C)     Temp Source 04/29/22 0840 Oral     SpO2 04/29/22 0840 95 %     Weight --      Height --      Head Circumference --      Peak Flow --      Pain Score 04/29/22 0842 7     Pain Loc --      Pain Edu? --      Excl. in [place]? --     Most recent vital signs: Vitals:   04/29/22 1139 04/29/22 1305  BP: 140/80 132/74  Pulse: (!) 110 98  Resp: 16 16  Temp:    SpO2: 95% 96%     General: Alert, uncomfortable appearing but in no acute distress. CV:  Good peripheral perfusion.  Resp:  Normal effort.  Abd:  Soft with no focal tenderness.  No distention.  Other:  No scleral icterus.  Dry mucous membranes.   ED Results / Procedures / Treatments   Labs (all labs ordered are listed, but only abnormal results are displayed) Labs Reviewed  COMPREHENSIVE METABOLIC PANEL - Abnormal; Notable for the following components:      Result Value   Glucose, Bld 195 (*)    AST 50 (*)    ALT 59 (*)    All other components within normal limits  CBC - Abnormal; Notable for the following components:   WBC 17.5 (*)    RBC 5.19 (*)    All other components within normal limits   URINALYSIS, ROUTINE W REFLEX MICROSCOPIC - Abnormal; Notable for the following components:   Color, Urine YELLOW (*)    APPearance HAZY (*)    pH 9.0 (*)    All other components within normal limits  RESP PANEL BY RT-PCR (FLU A&B, COVID) ARPGX2  LIPASE, BLOOD  POC URINE PREG, ED     EKG     RADIOLOGY    PROCEDURES:  Critical Care performed: No  Procedures   MEDICATIONS ORDERED IN ED: Medications  sodium chloride 0.9 % bolus 1,000 mL (0 mLs Intravenous Stopped 04/29/22 1047)  ondansetron (ZOFRAN) injection 4 mg (4 mg Intravenous Given 04/29/22 0918)  metoCLOPramide (REGLAN) injection 10 mg (10 mg Intravenous Given 04/29/22 0957)  famotidine (PEPCID) IVPB 20 mg premix (0 mg Intravenous Stopped 04/29/22 1206)  sodium chloride 0.9 % bolus 1,000 mL (0 mLs Intravenous Stopped 04/29/22 1209)  acetaminophen (TYLENOL) tablet 650 mg (650 mg Oral Given 04/29/22 1138)     IMPRESSION / MDM / ASSESSMENT  AND PLAN / ED COURSE  I reviewed the triage vital signs and the nursing notes.  46 year old female with a history of diabetes presents with persistent nausea and vomiting over the last 5 days initially associate with diarrhea.  I reviewed the past medical records.  The patient follows with [facility] and was last seen there in March.  She is on metformin for the diabetes.  On exam the patient is uncomfortable appearing but in no acute distress.  Her temperature is borderline elevated and she is tachycardic.  Other vital signs are normal.  The abdomen is soft with no focal tenderness.  Differential diagnosis includes, but is not limited to, gastroenteritis, gastritis, gastroparesis, pancreatitis, other hepatobiliary cause, or less likely colitis or other inflammatory/infectious etiology.  Given the lack of abdominal distention or tenderness I do not suspect surgical etiology.  Patient's presentation is most consistent with acute presentation with potential threat to life or  bodily function.  We will obtain lab work-up, give fluids and antiemetics, and reassess.  At this time there is no indication for imaging.  The patient is on the cardiac monitor to evaluate for evidence of arrhythmia and/or significant heart rate changes.  ----------------------------------------- 12:55 PM on 04/29/2022 -----------------------------------------  Lab work-up is overall unremarkable except for leukocytosis which is nonspecific and could be related to a viral infection.  The respiratory panel is negative.  LFTs are minimally elevated but the CMP is otherwise unremarkable.  The patient had minimal relief with Zofran but significant relief with Reglan and Pepcid.  She is now feeling much better.  Her heart rate has normalized after additional fluids.  She is not having any abdominal pain.  The abdomen is soft and nontender on repeat examination.  At this time, there is no evidence for concerning acute intra-abdominal process or any indication for imaging.  The patient feels well and wants to go home.  I counseled her on the results of the work-up.  Overall presentation is still consistent with gastroenteritis, with some possible component of gastritis and/or gastroparesis.  I will prescribe Reglan and Pepcid.  Return precautions given, and the patient expresses understanding.   FINAL CLINICAL IMPRESSION(S) / ED DIAGNOSES   Final diagnoses:  Nausea and vomiting, unspecified vomiting type     Rx / DC Orders   ED Discharge Orders          Ordered    metoCLOPramide (REGLAN) 10 MG tablet  Every 8 hours PRN        04/29/22 1253    famotidine (PEPCID) 20 MG tablet  2 times daily        04/29/22 1253             Note:  This document was prepared using Dragon voice recognition software and may include unintentional dictation errors.    Arta Silence, MD 04/29/22 [phone]

## 2022-04-29 NOTE — ED Triage Notes (Addendum)
Pt c/o vomiting for the past 5 days, had diarrhea for 3 days. Abd and head pain "I think it from al the vomiting and dehydration" pt is dry heaving in triage

## 2022-05-07 ENCOUNTER — Ambulatory Visit (INDEPENDENT_AMBULATORY_CARE_PROVIDER_SITE_OTHER): Payer: 59 | Admitting: Primary Care

## 2022-05-07 VITALS — BP 120/82 | HR 103 | Temp 97.6°F | Ht 65.0 in | Wt 264.5 lb

## 2022-05-07 DIAGNOSIS — R197 Diarrhea, unspecified: Secondary | ICD-10-CM | POA: Diagnosis not present

## 2022-05-07 DIAGNOSIS — R112 Nausea with vomiting, unspecified: Secondary | ICD-10-CM

## 2022-05-07 NOTE — Patient Instructions (Signed)
Stop by the lab prior to leaving today. I will notify you of your results once received.   Remain off of Ozempic.  Please update [place] if no continued improvement in your symptoms.  It was a pleasure to see you today!

## 2022-05-07 NOTE — Assessment & Plan Note (Addendum)
Improving which is reassuring.  ED notes, labs reviewed.  Remain off of Ozempic for now. Do suspect metformin to be contributing to a return in diarrhea.  Checking labs today.  Continue Phenergan PRN.  Work on hydration.   She appears stable today. ED precautions provided.

## 2022-05-07 NOTE — Progress Notes (Signed)
Subjective:    Patient ID: Kelli Cox, female    DOB: June 22, 1976, 46 y.o.   MRN: [id number]  HPI  Kelli Cox is a very pleasant 46 y.o. female with a history of type 2 diabetes, migraines, asthma who presents today for ED follow up.  She presented to [facility] ED on 04/29/22 with a five day history of nausea and vomiting with left sided abdominal pain, and diarrhea that had resolved.   During her stay in the ED she underwent labs (non specific leukocytosis, mild elevation of LFT's), respiratory panel (negative), lipase negative. Exam was not consistent for acute abdominal process so no imaging was obtained. She was treated with IV fluids, IV zofran, IV reglan, and IV pepcid. Symptoms improved so she was discharged home with Reglan and Pepcid  Since her ED visit she's feeling better. She is still nauseated. She is no longer dry heaving. Her diarrhea resumed three days ago, occurs 1 hour after eating, she resumed metformin 3 days ago. She does not have diarrhea with water intake. She is doing better with Pepcid.   She did notify the ED physician that she is managed on Ozempic, but he didn't comment about this. She skipped her Ozempic injection this week. She denies abdominal pain, sick contacts, changes in medications, fertility treatments, new supplements, urinary symptoms.   She stopped all of her medications after her ED visit on 04/29/22, including metformin. She resumed metformin and zoloft three days ago. She would like a refill of her Phenergan. She is not taking Reglan.    Review of Systems  Constitutional:  Negative for chills, fatigue and fever.  Respiratory:  Negative for shortness of breath.   Gastrointestinal:  Positive for diarrhea and nausea. Negative for abdominal pain, blood in stool and vomiting.  Genitourinary:  Negative for dysuria, frequency, hematuria and urgency.        Past Medical History:  Diagnosis Date   Acute non-recurrent sinusitis 10/17/2020   Asthma     Chicken pox    GAD (generalized anxiety disorder)    Migraines    Pain of upper abdomen 01/23/2021   Type 2 diabetes mellitus ([place])     Social History   Socioeconomic History   Marital status: Single    Spouse name: Not on file   Number of children: Not on file   Years of education: Not on file   Highest education level: Master's degree (e.g., MA, MS, MEng, MEd, MSW, MBA)  Occupational History   Occupation: Government social research officer  Tobacco Use   Smoking status: Never   Smokeless tobacco: Never  Vaping Use   Vaping Use: Never used  Substance and Sexual Activity   Alcohol use: Yes    Comment: 1 drink every two weeks    Drug use: Never   Sexual activity: Yes    Partners: Male    Birth control/protection: None  Other Topics Concern   Not on file  Social History Narrative   Not on file   Social Determinants of Health   Financial Resource Strain: Not on file  Food Insecurity: Not on file  Transportation Needs: Not on file  Physical Activity: Not on file  Stress: Not on file  Social Connections: Not on file  Intimate Partner Violence: Not on file    Past Surgical History:  Procedure Laterality Date   CHOLECYSTECTOMY  2008   COLONOSCOPY WITH PROPOFOL N/A 07/05/2021   Procedure: COLONOSCOPY WITH PROPOFOL;  Surgeon: Jonathon Bellows, MD;  Location: [facility] ENDOSCOPY;  Service: Gastroenterology;  Laterality: N/A;   LIPOMA RESECTION  2012   WRIST SURGERY Left    2001 and 2007    Family History  Problem Relation Age of Onset   Arthritis Mother    Asthma Mother    Depression Mother    Hyperlipidemia Mother    Hypertension Mother    Kidney disease Mother    Cancer Father    Diabetes Father    Early death Father    Hyperlipidemia Father    Hypertension Father    Heart failure Father    Asthma Sister    Hypertension Sister    74 / Stillbirths Sister    Breast cancer Paternal Aunt 67   Osteoarthritis Maternal Grandmother    Asthma Maternal Grandmother    Cancer  Maternal Grandmother    COPD Maternal Grandmother    Depression Maternal Grandmother    Early death Maternal Grandmother    Osteoarthritis Maternal Grandfather    Asthma Maternal Grandfather    COPD Maternal Grandfather    Hypertension Maternal Grandfather    Hyperlipidemia Maternal Grandfather    Cancer Paternal Grandmother    Diabetes Paternal Grandmother    Diabetes Paternal Grandfather    Hyperlipidemia Paternal Grandfather    Heart failure Paternal Grandfather    Kidney disease Paternal Grandfather    Alcohol abuse Brother    Hypertension Brother    Hyperlipidemia Other    Heart failure Other    Kidney disease Other     Allergies  Allergen Reactions   Clarithromycin Nausea And Vomiting    Current Outpatient Medications on File Prior to Visit  Medication Sig Dispense Refill   albuterol (PROVENTIL) (2.5 MG/3ML) 0.083% nebulizer solution Take 3 mLs (2.5 mg total) by nebulization every 6 (six) hours as needed for wheezing or shortness of breath. 75 mL 0   albuterol (VENTOLIN HFA) 108 (90 Base) MCG/ACT inhaler Inhale 1-2 puffs into the lungs every 6 (six) hours as needed for wheezing or shortness of breath. 1 each 0   butalbital-aspirin-caffeine (FIORINAL) 50-325-40 MG capsule Take 1 capsule by mouth every 6 (six) hours as needed for headache. For headache/migraines 20 capsule 0   cetirizine (ZYRTEC) 10 MG tablet Take 1 tablet (10 mg total) by mouth daily. 30 tablet 11   famotidine (PEPCID) 20 MG tablet Take 1 tablet (20 mg total) by mouth 2 (two) times daily for 15 days. 30 tablet 0   Fluticasone-Salmeterol (ADVAIR) 100-50 MCG/DOSE AEPB Inhale into the lungs as needed.     hydrOXYzine (ATARAX) 10 MG tablet Take 1-2 tablets (10-20 mg total) by mouth 2 (two) times daily as needed for anxiety. 60 tablet 0   letrozole (FEMARA) 2.5 MG tablet Take 2.5 mg by mouth daily.     metFORMIN (GLUCOPHAGE) 1000 MG tablet Take 1 tablet (1,000 mg total) by mouth 2 (two) times daily with a meal.  For diabetes. 180 tablet 3   Multiple Vitamins-Minerals (MULTIVITAMIN WITH MINERALS) tablet Take 1 tablet by mouth daily.     nortriptyline (PAMELOR) 50 MG capsule TAKE 1 CAPSULE BY MOUTH EVERYDAY AT BEDTIME 90 capsule 2   ORILISSA 200 MG TABS Take 1 tablet by mouth daily.     promethazine (PHENERGAN) 25 MG tablet Take 1 tablet (25 mg total) by mouth every 6 (six) hours as needed for nausea or vomiting. 30 tablet 3   Semaglutide, 1 MG/DOSE, 4 MG/3ML SOPN Inject 1 mg as directed once a week. For diabetes 3 mL 0   sertraline (ZOLOFT)  50 MG tablet Take 1 tablet (50 mg total) by mouth daily. For anxiety. 90 tablet 3   tiZANidine (ZANAFLEX) 4 MG tablet Take 1 tablet (4 mg total) by mouth every 8 (eight) hours as needed for muscle spasms. 30 tablet 3   vitamin E 180 MG (400 UNITS) capsule Take 400 Units by mouth daily.     metoCLOPramide (REGLAN) 10 MG tablet Take 1 tablet (10 mg total) by mouth every 8 (eight) hours as needed for up to 5 days for nausea or vomiting. 15 tablet 0   No current facility-administered medications on file prior to visit.    BP 120/82   Pulse (!) 103   Temp 97.6 F (36.4 C) (Temporal)   Ht '5\' 5"'$  (1.651 m)   Wt 264 lb 8 oz (120 kg)   LMP 04/13/2022 (Exact Date)   SpO2 98%   BMI 44.02 kg/m  Objective:   Physical Exam Constitutional:      General: She is not in acute distress.    Appearance: She is not ill-appearing.  Cardiovascular:     Rate and Rhythm: Normal rate and regular rhythm.  Pulmonary:     Effort: Pulmonary effort is normal.     Breath sounds: Normal breath sounds.  Abdominal:     General: Bowel sounds are normal.     Palpations: Abdomen is soft.     Tenderness: There is abdominal tenderness in the left upper quadrant. There is no guarding.  Musculoskeletal:     Cervical back: Neck supple.  Skin:    General: Skin is warm and dry.          Assessment & Plan:   Problem List Items Addressed This Visit       Digestive   Nausea vomiting  and diarrhea - Primary    Improving which is reassuring.  ED notes, labs reviewed.  Remain off of Ozempic for now. Do suspect metformin to be contributing to a return in diarrhea.  Checking labs today.  Continue Phenergan PRN.  Work on hydration.   She appears stable today. ED precautions provided.       Relevant Orders   CBC with Differential/Platelet   Comprehensive metabolic panel       Pleas Koch, NP

## 2022-05-08 LAB — COMPREHENSIVE METABOLIC PANEL
ALT: 57 U/L — ABNORMAL HIGH (ref 0–35)
AST: 41 U/L — ABNORMAL HIGH (ref 0–37)
Albumin: 4.3 g/dL (ref 3.5–5.2)
Alkaline Phosphatase: 62 U/L (ref 39–117)
BUN: 6 mg/dL (ref 6–23)
CO2: 27 mEq/L (ref 19–32)
Calcium: 9.7 mg/dL (ref 8.4–10.5)
Chloride: 99 mEq/L (ref 96–112)
Creatinine, Ser: 0.63 mg/dL (ref 0.40–1.20)
GFR: 106.73 mL/min (ref >60.00)
Glucose, Bld: 191 mg/dL — ABNORMAL HIGH (ref 70–99)
Potassium: 4.2 mEq/L (ref 3.5–5.1)
Sodium: 138 mEq/L (ref 135–145)
Total Bilirubin: 0.5 mg/dL (ref 0.2–1.2)
Total Protein: 6.8 g/dL (ref 6.0–8.3)

## 2022-05-08 LAB — CBC WITH DIFFERENTIAL/PLATELET
Basophils Absolute: 0 10*3/uL (ref 0.0–0.1)
Basophils Relative: 0.3 % (ref 0.0–3.0)
Eosinophils Absolute: 0.3 10*3/uL (ref 0.0–0.7)
Eosinophils Relative: 2.9 % (ref 0.0–5.0)
HCT: 42.2 % (ref 36.0–46.0)
Hemoglobin: 13.8 g/dL (ref 12.0–15.0)
Lymphocytes Relative: 30.2 % (ref 12.0–46.0)
Lymphs Abs: 3.4 10*3/uL (ref 0.7–4.0)
MCHC: 32.7 g/dL (ref 30.0–36.0)
MCV: 82.4 fl (ref 78.0–100.0)
Monocytes Absolute: 0.7 10*3/uL (ref 0.1–1.0)
Monocytes Relative: 6 % (ref 3.0–12.0)
Neutro Abs: 6.8 10*3/uL (ref 1.4–7.7)
Neutrophils Relative %: 60.6 % (ref 43.0–77.0)
Platelets: 329 10*3/uL (ref 150.0–400.0)
RBC: 5.12 Mil/uL — ABNORMAL HIGH (ref 3.87–5.11)
RDW: 16.1 % — ABNORMAL HIGH (ref 11.5–15.5)
WBC: 11.2 10*3/uL — ABNORMAL HIGH (ref 4.0–10.5)

## 2022-05-09 MED ORDER — ONDANSETRON 4 MG PO TBDP: 4.0000 mg | ORAL_TABLET | Freq: Three times a day (TID) | ORAL | 0 refills | Status: DC | PRN

## 2022-05-14 ENCOUNTER — Ambulatory Visit: Payer: 59 | Admitting: Primary Care

## 2022-05-15 ENCOUNTER — Encounter: Payer: Self-pay | Admitting: Family Medicine

## 2022-05-16 MED ORDER — PROMETHAZINE HCL 25 MG PO TABS: 25.0000 mg | ORAL_TABLET | Freq: Four times a day (QID) | ORAL | 6 refills | Status: DC | PRN

## 2022-05-21 ENCOUNTER — Other Ambulatory Visit: Payer: Self-pay | Admitting: Primary Care

## 2022-05-21 DIAGNOSIS — E119 Type 2 diabetes mellitus without complications: Secondary | ICD-10-CM

## 2022-05-27 ENCOUNTER — Ambulatory Visit: Payer: 59 | Admitting: Primary Care

## 2022-06-02 ENCOUNTER — Other Ambulatory Visit: Payer: Self-pay | Admitting: Primary Care

## 2022-06-02 DIAGNOSIS — F411 Generalized anxiety disorder: Secondary | ICD-10-CM

## 2022-06-16 ENCOUNTER — Encounter: Payer: Self-pay | Admitting: Family Medicine

## 2022-06-17 ENCOUNTER — Other Ambulatory Visit: Payer: Self-pay | Admitting: Physical Therapy

## 2022-06-17 ENCOUNTER — Other Ambulatory Visit: Payer: Self-pay | Admitting: Primary Care

## 2022-06-17 DIAGNOSIS — F411 Generalized anxiety disorder: Secondary | ICD-10-CM

## 2022-06-17 MED ORDER — TIZANIDINE HCL 4 MG PO TABS
4.0000 mg | ORAL_TABLET | Freq: Three times a day (TID) | ORAL | 3 refills | Status: DC | PRN
Start: 2022-06-17 — End: 2022-11-18

## 2022-06-19 ENCOUNTER — Ambulatory Visit: Payer: 59 | Admitting: Primary Care

## 2022-06-19 ENCOUNTER — Encounter: Payer: Self-pay | Admitting: Primary Care

## 2022-06-19 VITALS — BP 120/64 | HR 90 | Temp 98.6°F | Ht 65.0 in | Wt 268.0 lb

## 2022-06-19 DIAGNOSIS — G43709 Chronic migraine without aura, not intractable, without status migrainosus: Secondary | ICD-10-CM

## 2022-06-19 DIAGNOSIS — S060X0S Concussion without loss of consciousness, sequela: Secondary | ICD-10-CM | POA: Diagnosis not present

## 2022-06-19 DIAGNOSIS — N979 Female infertility, unspecified: Secondary | ICD-10-CM

## 2022-06-19 DIAGNOSIS — E1165 Type 2 diabetes mellitus with hyperglycemia: Secondary | ICD-10-CM

## 2022-06-19 DIAGNOSIS — J4520 Mild intermittent asthma, uncomplicated: Secondary | ICD-10-CM | POA: Diagnosis not present

## 2022-06-19 DIAGNOSIS — F411 Generalized anxiety disorder: Secondary | ICD-10-CM

## 2022-06-19 LAB — TSH: TSH: 2.54 u[IU]/mL (ref 0.35–5.50)

## 2022-06-19 LAB — POCT GLYCOSYLATED HEMOGLOBIN (HGB A1C): Hemoglobin A1C: 8.9 % — AB (ref 4.0–5.6)

## 2022-06-19 LAB — LIPID PANEL
Cholesterol: 160 mg/dL (ref 0–200)
HDL: 36.6 mg/dL — ABNORMAL LOW (ref >39.00)
NonHDL: 123.69
Total CHOL/HDL Ratio: 4
Triglycerides: 211 mg/dL — ABNORMAL HIGH (ref 0.0–149.0)
VLDL: 42.2 mg/dL — ABNORMAL HIGH (ref 0.0–40.0)

## 2022-06-19 LAB — LDL CHOLESTEROL, DIRECT: Direct LDL: 107 mg/dL

## 2022-06-19 LAB — MICROALBUMIN / CREATININE URINE RATIO
Creatinine,U: 125.4 mg/dL
Microalb Creat Ratio: 1.3 mg/g (ref 0.0–30.0)
Microalb, Ur: 1.6 mg/dL (ref 0.0–1.9)

## 2022-06-19 MED ORDER — FREESTYLE LIBRE 3 SENSOR MISC: 1 refills | Status: DC

## 2022-06-19 MED ORDER — GLIPIZIDE 5 MG PO TABS: 5.0000 mg | ORAL_TABLET | Freq: Two times a day (BID) | ORAL | 1 refills | Status: DC

## 2022-06-19 NOTE — Assessment & Plan Note (Signed)
Deteriorated with A1C of 8.9 today.  Unfortunately Ozempic caused intolerable nausea.   Continue metformin 1000 mg BID. Add Glipizide 5 mg BID.  Rx for [employer] 3 sensors sent to pharmacy.  Follow up in 3 months.

## 2022-06-19 NOTE — Assessment & Plan Note (Signed)
Controlled.  Continue Advair 100-50 mcg daily during seasonal changes. Continue albuterol PRN, she is using infrequently

## 2022-06-19 NOTE — Assessment & Plan Note (Signed)
Improving.  Continue to monitor.  Continue to use promethazine and tizanidine sparingly.

## 2022-06-19 NOTE — Assessment & Plan Note (Signed)
Following with fertility specialist. TSH pending per patient request.  Need to get A1C down to 6.5 or below.

## 2022-06-19 NOTE — Assessment & Plan Note (Signed)
Controlled.  Continue sertraline 50 mg daily. Continue hydroxyzine 10 mg PRN.

## 2022-06-19 NOTE — Progress Notes (Signed)
Subjective:    Patient ID: Kelli Cox, female    DOB: June 13, 1976, 46 y.o.   MRN: [id number]  Diabetes Hypoglycemia symptoms include headaches. Pertinent negatives for hypoglycemia include no dizziness or nervousness/anxiousness. Pertinent negatives for diabetes include no chest pain.    Kelli Cox is a very pleasant 46 y.o. female with a history of type 2 diabetes, migraines, asthma, GAD who presents today for follow-up of chronic conditions.  1) Type 2 Diabetes:   Current medications include: metformin 1000 mg BID. Previously on Ozempic but this caused nausea. Her nausea is better since coming off Ozempic.   She is checking her blood glucose 1 times daily and is getting readings of:  AM fasting: 150's  Last A1C: 7.3 in April 2023, 8.9 today Last Eye Exam: Due Last Foot Exam: UTD Pneumonia Vaccination: 2018 Urine Microalbumin: Due Statin: None. Trying to get pregnant  Dietary changes since last visit: She joined weight watchers a few months ago, but resumed last week   Exercise: Walking  2) GAD: Currently managed on sertraline 50 mg daily and hydroxyzine 10 mg PRN. Overall she feels well managed on this regimen. She is using hydroxyzine sparingly.  3) Migraines/Concussion: Currently managed on Fioricet PRN and nortriptyline 50 mg PRN. She is not taking either medications, only taking tylenol. Overall headaches have improved. She has not had to use Tizanidine and she's trying to reduce use of promethazine.  4) Asthma: Currently managed on Advair 100-50 mcg daily, albuterol inhaler as needed. Currently not using either inhaler. She will use Advair daily during seasonal changes. She does well on this regimen.   5) Infertility: Currently following with fertility specialist at [facility]. She is managed on letrozole 2.5 mg daily, and Orilissa 200 mg BID. She is needing a TSH drawn today.   Review of Systems  Respiratory:  Negative for shortness of breath.    Cardiovascular:  Negative for chest pain.  Gastrointestinal:  Negative for nausea.  Neurological:  Positive for headaches. Negative for dizziness.  Psychiatric/Behavioral:  The patient is not nervous/anxious.          Past Medical History:  Diagnosis Date   Acute non-recurrent sinusitis 10/17/2020   Asthma    Chicken pox    GAD (generalized anxiety disorder)    Migraines    Pain of upper abdomen 01/23/2021   Type 2 diabetes mellitus ([place])     Social History   Socioeconomic History   Marital status: Single    Spouse name: Not on file   Number of children: Not on file   Years of education: Not on file   Highest education level: Master's degree (e.g., MA, MS, MEng, MEd, MSW, MBA)  Occupational History   Occupation: Government social research officer  Tobacco Use   Smoking status: Never   Smokeless tobacco: Never  Vaping Use   Vaping Use: Never used  Substance and Sexual Activity   Alcohol use: Yes    Comment: 1 drink every two weeks    Drug use: Never   Sexual activity: Yes    Partners: Male    Birth control/protection: None  Other Topics Concern   Not on file  Social History Narrative   Not on file   Social Determinants of Health   Financial Resource Strain: Not on file  Food Insecurity: Not on file  Transportation Needs: Not on file  Physical Activity: Not on file  Stress: Not on file  Social Connections: Not on file  Intimate Partner Violence:  Not on file    Past Surgical History:  Procedure Laterality Date   CHOLECYSTECTOMY  2008   COLONOSCOPY WITH PROPOFOL N/A 07/05/2021   Procedure: COLONOSCOPY WITH PROPOFOL;  Surgeon: Jonathon Bellows, MD;  Location: [facility] ENDOSCOPY;  Service: Gastroenterology;  Laterality: N/A;   LIPOMA RESECTION  2012   WRIST SURGERY Left    2001 and 2007    Family History  Problem Relation Age of Onset   Arthritis Mother    Asthma Mother    Depression Mother    Hyperlipidemia Mother    Hypertension Mother    Kidney disease Mother    Cancer  Father    Diabetes Father    Early death Father    Hyperlipidemia Father    Hypertension Father    Heart failure Father    Asthma Sister    Hypertension Sister    101 / Stillbirths Sister    Breast cancer Paternal Aunt 71   Osteoarthritis Maternal Grandmother    Asthma Maternal Grandmother    Cancer Maternal Grandmother    COPD Maternal Grandmother    Depression Maternal Grandmother    Early death Maternal Grandmother    Osteoarthritis Maternal Grandfather    Asthma Maternal Grandfather    COPD Maternal Grandfather    Hypertension Maternal Grandfather    Hyperlipidemia Maternal Grandfather    Cancer Paternal Grandmother    Diabetes Paternal Grandmother    Diabetes Paternal Grandfather    Hyperlipidemia Paternal Grandfather    Heart failure Paternal Grandfather    Kidney disease Paternal Grandfather    Alcohol abuse Brother    Hypertension Brother    Hyperlipidemia Other    Heart failure Other    Kidney disease Other     Allergies  Allergen Reactions   Clarithromycin Nausea And Vomiting    Current Outpatient Medications on File Prior to Visit  Medication Sig Dispense Refill   albuterol (VENTOLIN HFA) 108 (90 Base) MCG/ACT inhaler Inhale 1-2 puffs into the lungs every 6 (six) hours as needed for wheezing or shortness of breath. 1 each 0   butalbital-aspirin-caffeine (FIORINAL) 50-325-40 MG capsule Take 1 capsule by mouth every 6 (six) hours as needed for headache. For headache/migraines 20 capsule 0   cetirizine (ZYRTEC) 10 MG tablet Take 1 tablet (10 mg total) by mouth daily. 30 tablet 11   Fluticasone-Salmeterol (ADVAIR) 100-50 MCG/DOSE AEPB Inhale into the lungs as needed.     hydrOXYzine (ATARAX) 10 MG tablet TAKE 1-2 TABLETS (10-20 MG TOTAL) BY MOUTH 2 (TWO) TIMES DAILY AS NEEDED FOR ANXIETY. 60 tablet 0   letrozole (FEMARA) 2.5 MG tablet Take 2.5 mg by mouth daily.     metFORMIN (GLUCOPHAGE) 1000 MG tablet TAKE 1 TABLET (1,000 MG TOTAL) BY MOUTH 2 (TWO)  TIMES DAILY WITH A MEAL. FOR DIABETES. 180 tablet 0   Multiple Vitamins-Minerals (MULTIVITAMIN WITH MINERALS) tablet Take 1 tablet by mouth daily.     nortriptyline (PAMELOR) 50 MG capsule TAKE 1 CAPSULE BY MOUTH EVERYDAY AT BEDTIME 90 capsule 2   ondansetron (ZOFRAN-ODT) 4 MG disintegrating tablet Take 1-2 tablets (4-8 mg total) by mouth every 8 (eight) hours as needed for nausea or vomiting. 20 tablet 0   ORILISSA 200 MG TABS Take 1 tablet by mouth daily.     promethazine (PHENERGAN) 25 MG tablet Take 1 tablet (25 mg total) by mouth every 6 (six) hours as needed for nausea or vomiting. 30 tablet 6   sertraline (ZOLOFT) 50 MG tablet TAKE 1 TABLET (50 MG  TOTAL) BY MOUTH DAILY. FOR ANXIETY. 90 tablet 0   tiZANidine (ZANAFLEX) 4 MG tablet Take 1 tablet (4 mg total) by mouth every 8 (eight) hours as needed for muscle spasms. 30 tablet 3   vitamin E 180 MG (400 UNITS) capsule Take 400 Units by mouth daily.     No current facility-administered medications on file prior to visit.    BP 120/64   Pulse 90   Temp 98.6 F (37 C) (Oral)   Ht '5\' 5"'$  (1.651 m)   Wt 268 lb (121.6 kg)   SpO2 97%   BMI 44.60 kg/m  Objective:   Physical Exam Cardiovascular:     Rate and Rhythm: Normal rate and regular rhythm.  Pulmonary:     Effort: Pulmonary effort is normal.     Breath sounds: Normal breath sounds.  Abdominal:     General: Bowel sounds are normal.     Palpations: Abdomen is soft.     Tenderness: There is no abdominal tenderness.  Musculoskeletal:     Cervical back: Neck supple.  Skin:    General: Skin is warm and dry.  Neurological:     Mental Status: She is alert.  Psychiatric:        Mood and Affect: Mood normal.           Assessment & Plan:   Problem List Items Addressed This Visit       Cardiovascular and Mediastinum   Migraines    Controlled.  Continue PRN Fiorinal, discussed to use sparingly.         Respiratory   Asthma    Controlled.  Continue Advair 100-50  mcg daily during seasonal changes. Continue albuterol PRN, she is using infrequently        Endocrine   Type 2 diabetes mellitus with hyperglycemia (HCC) - Primary    Deteriorated with A1C of 8.9 today.  Unfortunately Ozempic caused intolerable nausea.   Continue metformin 1000 mg BID. Add Glipizide 5 mg BID.  Rx for [employer] 3 sensors sent to pharmacy.  Follow up in 3 months.       Relevant Medications   glipiZIDE (GLUCOTROL) 5 MG tablet   Continuous Blood Gluc Sensor (FREESTYLE LIBRE 3 SENSOR) MISC   Other Relevant Orders   POCT glycosylated hemoglobin (Hb A1C) (Completed)   Lipid panel   Microalbumin / creatinine urine ratio     Nervous and Auditory   Concussion with no loss of consciousness    Improving.  Continue to monitor.  Continue to use promethazine and tizanidine sparingly.         Genitourinary   Female infertility    Following with fertility specialist. TSH pending per patient request.  Need to get A1C down to 6.5 or below.       Relevant Orders   TSH     Other   GAD (generalized anxiety disorder)    Controlled.  Continue sertraline 50 mg daily. Continue hydroxyzine 10 mg PRN.           Pleas Koch, NP

## 2022-06-19 NOTE — Assessment & Plan Note (Signed)
Controlled.  Continue PRN Fiorinal, discussed to use sparingly.

## 2022-06-19 NOTE — Patient Instructions (Signed)
Start Glipizide 5 mg twice daily for diabetes. Continue metformin 1000 mg twice daily.  Continue to work on weight loss through [employer].  Continue exercising. You should be getting 150 minutes of moderate intensity exercise weekly.  Please schedule a follow up visit for 3 months.  It was a pleasure to see you today!

## 2022-06-24 LAB — HM DIABETES EYE EXAM

## 2022-06-26 ENCOUNTER — Encounter: Payer: Self-pay | Admitting: Primary Care

## 2022-07-11 ENCOUNTER — Telehealth: Payer: 59 | Admitting: Family Medicine

## 2022-07-11 DIAGNOSIS — H00011 Hordeolum externum right upper eyelid: Secondary | ICD-10-CM

## 2022-07-11 MED ORDER — OFLOXACIN 0.3 % OP SOLN: 1.0000 [drp] | Freq: Four times a day (QID) | OPHTHALMIC | 0 refills | Status: DC

## 2022-07-11 MED ORDER — NEOMYCIN-POLYMYXIN-HC 3.5-10000-1 OP SUSP: 3.0000 [drp] | Freq: Four times a day (QID) | OPHTHALMIC | 0 refills | Status: DC

## 2022-07-11 NOTE — Addendum Note (Signed)
Addended by: Mar Daring on: 07/11/2022 11:54 AM   Modules accepted: Orders

## 2022-07-11 NOTE — Progress Notes (Signed)
  E-Visit for Stye   We are sorry that you are not feeling well. Here is how we plan to help!  Based on what you have shared with me it looks like you have a stye.  A stye is an inflammation of the eyelid.  It is often a red, painful lump near the edge of the eyelid that may look like a boil or a pimple.  A stye develops when an infection occurs at the base of an eyelash.   We have made appropriate suggestions for you based upon your presentation: Your symptoms may indicate an infection of the sclera.  The use of anti-inflammatory and antibiotic eye drops for a week will help resolve this condition.  I have sent in neomycin-polymyxin HC opthalmic suspension, two to three drops in the affected eye every 4 hours.  If your symptoms do not improve over the next two to three days you should be seen in your doctor's office.  HOME CARE:  Wash your hands often! Let the stye open on its own. Don't squeeze or open it. Don't rub your eyes. This can irritate your eyes and let in bacteria.  If you need to touch your eyes, wash your hands first. Don't wear eye makeup or contact lenses until the area has healed.  GET HELP RIGHT AWAY IF:  Your symptoms do not improve. You develop blurred or loss of vision. Your symptoms worsen (increased discharge, pain or redness).   Thank you for choosing an e-visit.  Your e-visit answers were reviewed by a board certified advanced clinical practitioner to complete your personal care plan. Depending upon the condition, your plan could have included both over the counter or prescription medications.  Please review your pharmacy choice. Make sure the pharmacy is open so you can pick up prescription now. If there is a problem, you may contact your provider through [employer] and have the prescription routed to another pharmacy.  Your safety is important to [place]. If you have drug allergies check your prescription carefully.   For the next 24 hours you can use  MyChart to ask questions about today's visit, request a non-urgent call back, or ask for a work or school excuse. You will get an email in the next two days asking about your experience. I hope that your e-visit has been valuable and will speed your recovery.  I have provided 5 minutes of non face to face time during this encounter for chart review and documentation.

## 2022-07-18 ENCOUNTER — Telehealth: Payer: Self-pay | Admitting: Primary Care

## 2022-07-18 DIAGNOSIS — E1165 Type 2 diabetes mellitus with hyperglycemia: Secondary | ICD-10-CM

## 2022-07-18 DIAGNOSIS — E119 Type 2 diabetes mellitus without complications: Secondary | ICD-10-CM

## 2022-07-18 DIAGNOSIS — F411 Generalized anxiety disorder: Secondary | ICD-10-CM

## 2022-07-18 NOTE — Telephone Encounter (Signed)
Prescription request was sent over for patient and placed in [facility] box. Thank you!

## 2022-07-21 NOTE — Telephone Encounter (Signed)
[facility] Glipizide. Looks like last refill was sent to local pharmacy

## 2022-07-28 ENCOUNTER — Other Ambulatory Visit: Payer: Self-pay

## 2022-07-28 DIAGNOSIS — E119 Type 2 diabetes mellitus without complications: Secondary | ICD-10-CM

## 2022-07-28 DIAGNOSIS — E1165 Type 2 diabetes mellitus with hyperglycemia: Secondary | ICD-10-CM

## 2022-07-28 DIAGNOSIS — F411 Generalized anxiety disorder: Secondary | ICD-10-CM

## 2022-07-28 MED ORDER — METFORMIN HCL 1000 MG PO TABS
1000.0000 mg | ORAL_TABLET | Freq: Two times a day (BID) | ORAL | 0 refills | Status: DC
Start: 2022-07-28 — End: 2022-12-15

## 2022-07-28 NOTE — Telephone Encounter (Signed)
Zoloft Rx was sent to pharmacy in late July 2023.

## 2022-07-28 NOTE — Telephone Encounter (Signed)
Fax received for Refill on Zoloft '50mg'$ 

## 2022-07-29 ENCOUNTER — Telehealth: Payer: 59 | Admitting: Physician Assistant

## 2022-07-29 DIAGNOSIS — B9689 Other specified bacterial agents as the cause of diseases classified elsewhere: Secondary | ICD-10-CM | POA: Diagnosis not present

## 2022-07-29 DIAGNOSIS — J019 Acute sinusitis, unspecified: Secondary | ICD-10-CM | POA: Diagnosis not present

## 2022-07-29 MED ORDER — AMOXICILLIN-POT CLAVULANATE 875-125 MG PO TABS: 1.0000 | ORAL_TABLET | Freq: Two times a day (BID) | ORAL | 0 refills | Status: DC

## 2022-07-29 NOTE — Progress Notes (Signed)

## 2022-07-29 NOTE — Progress Notes (Signed)
I have spent 5 minutes in review of e-visit questionnaire, review and updating patient chart, medical decision making and response to patient.   Laramie Gelles Cody Karson Reede, PA-C    

## 2022-07-31 ENCOUNTER — Encounter: Payer: Self-pay | Admitting: Family Medicine

## 2022-07-31 ENCOUNTER — Ambulatory Visit: Payer: 59 | Admitting: Family Medicine

## 2022-07-31 VITALS — BP 102/80 | HR 78 | Temp 99.0°F | Ht 65.0 in | Wt 267.2 lb

## 2022-07-31 DIAGNOSIS — E1165 Type 2 diabetes mellitus with hyperglycemia: Secondary | ICD-10-CM

## 2022-07-31 DIAGNOSIS — M79605 Pain in left leg: Secondary | ICD-10-CM | POA: Insufficient documentation

## 2022-07-31 DIAGNOSIS — R200 Anesthesia of skin: Secondary | ICD-10-CM | POA: Diagnosis not present

## 2022-07-31 DIAGNOSIS — M5442 Lumbago with sciatica, left side: Secondary | ICD-10-CM | POA: Insufficient documentation

## 2022-07-31 HISTORY — DX: Pain in left leg: M79.605

## 2022-07-31 MED ORDER — DICLOFENAC SODIUM 75 MG PO TBEC: 75.0000 mg | DELAYED_RELEASE_TABLET | Freq: Two times a day (BID) | ORAL | 0 refills | Status: DC

## 2022-07-31 NOTE — Progress Notes (Signed)
Patient ID: Kelli Cox, female    DOB: 1976-03-06, 46 y.o.   MRN: [id number]  This visit was conducted in person.  BP 102/80   Pulse 78   Temp 99 F (37.2 C) (Oral)   Ht '5\' 5"'$  (1.651 m)   Wt 267 lb 4 oz (121.2 kg)   LMP 06/20/2022   SpO2 98%   BMI 44.47 kg/m    CC:  Chief Complaint  Patient presents with   Leg Pain    Left Thigh x 3 days    Subjective:   HPI: Kelli Cox is a 46 y.o. female   morbidly obese patient of Isidoro Donning, NP  with diabetes.presenting on 07/31/2022 for Leg Pain (Left Thigh x 3 days)   New onset pain in  left lateral thigh x 3 days.  No rash, no swelling.  Very tender to touch but also feels numb in lateral thigh  No change in activity. No falls.  She sits at work all day, crossing legs   Hx of sciatica   Has tried eliminating pressure, sleeping on other side with pillow.  Ice and aleve  and ibuprofen 400 mg.. helped     Is in fertility therapy. Letrozole since last September.   Diabetes.. improving control recently per freestyle libre  Freestyle says A1C now 7.0.  After breakfast blood sugar 130 today.  Lab Results  Component Value Date   HGBA1C 8.9 (A) 06/19/2022    Relevant past medical, surgical, family and social history reviewed and updated as indicated. Interim medical history since our last visit reviewed. Allergies and medications reviewed and updated. Outpatient Medications Prior to Visit  Medication Sig Dispense Refill   albuterol (VENTOLIN HFA) 108 (90 Base) MCG/ACT inhaler Inhale 1-2 puffs into the lungs every 6 (six) hours as needed for wheezing or shortness of breath. 1 each 0   amoxicillin-clavulanate (AUGMENTIN) 875-125 MG tablet Take 1 tablet by mouth 2 (two) times daily. 14 tablet 0   butalbital-aspirin-caffeine (FIORINAL) 50-325-40 MG capsule Take 1 capsule by mouth every 6 (six) hours as needed for headache. For headache/migraines 20 capsule 0   cetirizine (ZYRTEC) 10 MG tablet Take 1 tablet (10 mg total) by  mouth daily. 30 tablet 11   Continuous Blood Gluc Sensor (FREESTYLE LIBRE 3 SENSOR) MISC Use continuously to check glucose readings, change sensor every 2 weeks. 6 each 1   Fluticasone-Salmeterol (ADVAIR) 100-50 MCG/DOSE AEPB Inhale into the lungs as needed.     glipiZIDE (GLUCOTROL) 5 MG tablet Take 1 tablet (5 mg total) by mouth 2 (two) times daily before a meal. for diabetes. 180 tablet 1   hydrOXYzine (ATARAX) 10 MG tablet TAKE 1-2 TABLETS (10-20 MG TOTAL) BY MOUTH 2 (TWO) TIMES DAILY AS NEEDED FOR ANXIETY. 60 tablet 0   letrozole (FEMARA) 2.5 MG tablet Take 2.5 mg by mouth daily.     metFORMIN (GLUCOPHAGE) 1000 MG tablet Take 1 tablet (1,000 mg total) by mouth 2 (two) times daily with a meal. For diabetes. 180 tablet 0   Multiple Vitamins-Minerals (MULTIVITAMIN WITH MINERALS) tablet Take 1 tablet by mouth daily.     norethindrone (AYGESTIN) 5 MG tablet Take 2.5 mg by mouth 2 (two) times daily.     ondansetron (ZOFRAN-ODT) 4 MG disintegrating tablet Take 1-2 tablets (4-8 mg total) by mouth every 8 (eight) hours as needed for nausea or vomiting. 20 tablet 0   promethazine (PHENERGAN) 25 MG tablet Take 1 tablet (25 mg total) by mouth every 6 (  six) hours as needed for nausea or vomiting. 30 tablet 6   sertraline (ZOLOFT) 50 MG tablet TAKE 1 TABLET (50 MG TOTAL) BY MOUTH DAILY. FOR ANXIETY. 90 tablet 0   tiZANidine (ZANAFLEX) 4 MG tablet Take 1 tablet (4 mg total) by mouth every 8 (eight) hours as needed for muscle spasms. 30 tablet 3   vitamin E 180 MG (400 UNITS) capsule Take 400 Units by mouth daily.     nortriptyline (PAMELOR) 50 MG capsule TAKE 1 CAPSULE BY MOUTH EVERYDAY AT BEDTIME 90 capsule 2   ofloxacin (OCUFLOX) 0.3 % ophthalmic solution Place 1 drop into the right eye 4 (four) times daily. X 5 days 5 mL 0   ORILISSA 200 MG TABS Take 1 tablet by mouth daily.     No facility-administered medications prior to visit.     Per HPI unless specifically indicated in ROS section below Review  of Systems  Constitutional:  Negative for fatigue and fever.  HENT:  Negative for congestion.   Eyes:  Negative for pain.  Respiratory:  Negative for cough and shortness of breath.   Cardiovascular:  Negative for chest pain, palpitations and leg swelling.  Gastrointestinal:  Negative for abdominal pain.  Genitourinary:  Negative for dysuria and vaginal bleeding.  Musculoskeletal:  Negative for back pain.  Neurological:  Negative for syncope, light-headedness and headaches.  Psychiatric/Behavioral:  Negative for dysphoric mood.    Objective:  BP 102/80   Pulse 78   Temp 99 F (37.2 C) (Oral)   Ht '5\' 5"'$  (1.651 m)   Wt 267 lb 4 oz (121.2 kg)   LMP 06/20/2022   SpO2 98%   BMI 44.47 kg/m   Wt Readings from Last 3 Encounters:  07/31/22 267 lb 4 oz (121.2 kg)  06/19/22 268 lb (121.6 kg)  05/07/22 264 lb 8 oz (120 kg)      Physical Exam Constitutional:      General: She is not in acute distress.    Appearance: Normal appearance. She is well-developed. She is morbidly obese. She is not ill-appearing or toxic-appearing.  HENT:     Head: Normocephalic.     Right Ear: Hearing, tympanic membrane, ear canal and external ear normal. Tympanic membrane is not erythematous, retracted or bulging.     Left Ear: Hearing, tympanic membrane, ear canal and external ear normal. Tympanic membrane is not erythematous, retracted or bulging.     Nose: No mucosal edema or rhinorrhea.     Right Sinus: No maxillary sinus tenderness or frontal sinus tenderness.     Left Sinus: No maxillary sinus tenderness or frontal sinus tenderness.     Mouth/Throat:     Pharynx: Uvula midline.  Eyes:     General: Lids are normal. Lids are everted, no foreign bodies appreciated.     Conjunctiva/sclera: Conjunctivae normal.     Pupils: Pupils are equal, round, and reactive to light.  Neck:     Thyroid: No thyroid mass or thyromegaly.     Vascular: No carotid bruit.     Trachea: Trachea normal.  Cardiovascular:      Rate and Rhythm: Normal rate and regular rhythm.     Pulses: Normal pulses.     Heart sounds: Normal heart sounds, S1 normal and S2 normal. No murmur heard.    No friction rub. No gallop.  Pulmonary:     Effort: Pulmonary effort is normal. No tachypnea or respiratory distress.     Breath sounds: Normal breath sounds. No decreased breath  sounds, wheezing, rhonchi or rales.  Abdominal:     General: Bowel sounds are normal.     Palpations: Abdomen is soft.     Tenderness: There is no abdominal tenderness.  Musculoskeletal:     Cervical back: Normal range of motion and neck supple.  Skin:    General: Skin is warm and dry.     Findings: No rash.  Neurological:     Mental Status: She is alert and oriented to person, place, and time.     Cranial Nerves: Cranial nerves 2-12 are intact.     Sensory: Sensory deficit present.     Motor: Motor function is intact. No weakness.     Coordination: Coordination is intact.     Comments:  Numbness in left lateral upper extremety  Psychiatric:        Mood and Affect: Mood is not anxious or depressed.        Speech: Speech normal.        Behavior: Behavior normal. Behavior is cooperative.        Thought Content: Thought content normal.        Judgment: Judgment normal.   Body mass index is 44.47 kg/m.     Results for orders placed or performed in visit on 06/26/22  HM DIABETES EYE EXAM  Result Value Ref Range   HM Diabetic Eye Exam No Retinopathy No Retinopathy     COVID 19 screen:  No recent travel or known exposure to [place] The patient denies respiratory symptoms of COVID 19 at this time. The importance of social distancing was discussed today.   Assessment and Plan    Problem List Items Addressed This Visit     Left leg numbness - Primary   Leg pain, lateral, left    Acute No  association with new back injury or fall.  No clear indication for imaging.  Most consistent with meralgia paresthetica versus other nerve compression.   Treat with anti-inflammatories weight loss gentle stretching and time. Avoid pressure on lateral leg      Type 2 diabetes mellitus with hyperglycemia (HCC)    Chronic, improving control  Glipizide 5 mg 2 times daily prior to meals Metformin 1000 mg twice daily        Eliezer Lofts, MD

## 2022-07-31 NOTE — Patient Instructions (Signed)
Can avoid pressure on lateral leg.   Start diclofenac 75 mg twice daily x 1-2 weeks.  Call if pain is not improving... can consider a neuropathic pain medication at that time.  Keep up great work with diabetes control.

## 2022-08-11 NOTE — Assessment & Plan Note (Signed)
Chronic, improving control  Glipizide 5 mg 2 times daily prior to meals Metformin 1000 mg twice daily

## 2022-08-11 NOTE — Assessment & Plan Note (Signed)
Acute No  association with new back injury or fall.  No clear indication for imaging.  Most consistent with meralgia paresthetica versus other nerve compression.  Treat with anti-inflammatories weight loss gentle stretching and time. Avoid pressure on lateral leg

## 2022-08-12 ENCOUNTER — Encounter: Payer: Self-pay | Admitting: Family Medicine

## 2022-08-13 ENCOUNTER — Other Ambulatory Visit: Payer: Self-pay | Admitting: Family Medicine

## 2022-08-13 MED ORDER — GABAPENTIN 100 MG PO CAPS: 100.0000 mg | ORAL_CAPSULE | Freq: Two times a day (BID) | ORAL | 0 refills | Status: DC

## 2022-08-22 ENCOUNTER — Telehealth: Payer: 59 | Admitting: Family Medicine

## 2022-08-22 DIAGNOSIS — J028 Acute pharyngitis due to other specified organisms: Secondary | ICD-10-CM

## 2022-08-22 MED ORDER — AMOXICILLIN 500 MG PO CAPS: 500.0000 mg | ORAL_CAPSULE | Freq: Three times a day (TID) | ORAL | 0 refills | Status: AC

## 2022-08-22 NOTE — Progress Notes (Signed)

## 2022-09-01 ENCOUNTER — Other Ambulatory Visit: Payer: Self-pay | Admitting: Family Medicine

## 2022-09-08 ENCOUNTER — Other Ambulatory Visit: Payer: Self-pay | Admitting: Primary Care

## 2022-09-08 DIAGNOSIS — F411 Generalized anxiety disorder: Secondary | ICD-10-CM

## 2022-09-15 ENCOUNTER — Other Ambulatory Visit: Payer: Self-pay | Admitting: Primary Care

## 2022-09-15 DIAGNOSIS — E1165 Type 2 diabetes mellitus with hyperglycemia: Secondary | ICD-10-CM

## 2022-09-19 ENCOUNTER — Ambulatory Visit: Payer: 59 | Admitting: Primary Care

## 2022-09-19 NOTE — Telephone Encounter (Signed)
Canceled appointment.

## 2022-10-02 ENCOUNTER — Ambulatory Visit: Payer: 59 | Admitting: Primary Care

## 2022-10-02 ENCOUNTER — Encounter: Payer: Self-pay | Admitting: Primary Care

## 2022-10-02 VITALS — BP 130/78 | Temp 98.1°F | Ht 65.0 in | Wt 273.0 lb

## 2022-10-02 DIAGNOSIS — E1165 Type 2 diabetes mellitus with hyperglycemia: Secondary | ICD-10-CM | POA: Diagnosis not present

## 2022-10-02 DIAGNOSIS — L989 Disorder of the skin and subcutaneous tissue, unspecified: Secondary | ICD-10-CM | POA: Diagnosis not present

## 2022-10-02 DIAGNOSIS — Z23 Encounter for immunization: Secondary | ICD-10-CM | POA: Diagnosis not present

## 2022-10-02 LAB — POCT GLYCOSYLATED HEMOGLOBIN (HGB A1C): Hemoglobin A1C: 6.2 % — AB (ref 4.0–5.6)

## 2022-10-02 NOTE — Progress Notes (Signed)
Established Patient Office Visit  Subjective   Patient ID: Kelli Cox, female    DOB: [birth year]  Age: 46 y.o. MRN: [id number]  Chief Complaint  Patient presents with   Follow-up    DM     HPI  Kelli Cox is a 46 year old female with past medical history of migraines, asthma, type 2 diabetes, GAD, who presents today for a diabetes follow up.   Current medications include: Metformin 1000 mg BID, Glipizide 5 mg BID. Last night, her blood sugar was 123, so she skipped her glipizide dose.   She is using the Free style continuous blood glucose monitoring device  and is getting readings of 90s-130s. In the last week her blood sugar dropped to 60 six times; which causes fatigue. She has noticed that it usually after she takes her glipizide.   Last A1C: 8.9 on 06/19/22 Last Eye Exam: UTD Last Foot Exam: UTD Pneumonia Vaccination: 2018 Urine Microalbumin: UTD Statin: Attempting to get pregnant. Goes to the fertility clinic.   Dietary changes since last visit: She started weight watchers. Eating a lot of fruits and vegetables. No fast foods and no sodas.   Exercise: She has been walking and started burn boot camp last week. She walks 30-45 mins daily. She is hoping to do the burn boot camp three times a week.   She goes to a fertility clinic  and they would like for her A1C to be 6.5 or below.   She has a few spots on her abdomen that she noticed two months. They are not painful or itchy but they do bleed and scab over and fall off and then come back. She did try vaseline but did not see any relief.   Patient Active Problem List   Diagnosis Date Noted   Skin lesions 10/02/2022   Left leg numbness 07/31/2022   Leg pain, lateral, left 07/31/2022   Female infertility 06/19/2022   Nausea vomiting and diarrhea 05/07/2022   Concussion with no loss of consciousness 08/27/2021   Family history of ovarian cancer 05/29/2021   Family history of breast cancer 05/29/2021   Type 2 diabetes  mellitus with hyperglycemia ([place]) 10/01/2020   Migraines 10/01/2020   Asthma 10/01/2020   GAD (generalized anxiety disorder) 10/01/2020   Past Medical History:  Diagnosis Date   Acute non-recurrent sinusitis 10/17/2020   Asthma    Chicken pox    GAD (generalized anxiety disorder)    Migraines    Pain of upper abdomen 01/23/2021   Type 2 diabetes mellitus ([place])    Past Surgical History:  Procedure Laterality Date   CHOLECYSTECTOMY  2008   COLONOSCOPY WITH PROPOFOL N/A 07/05/2021   Procedure: COLONOSCOPY WITH PROPOFOL;  Surgeon: Jonathon Bellows, MD;  Location[facility] END[facility]COPY;  Service: Gastroenterology;  Laterality: N/A;   LIPOMA RESECTION  2012   WRIST SURGERY Left    2001 and 2007   Allergies  Allergen Reactions   Clarithromycin Nausea And Vomiting      Review of Systems  Eyes:  Negative for blurred vision.  Respiratory:  Negative for shortness of breath.   Cardiovascular:  Negative for chest pain.  Gastrointestinal:  Negative for abdominal pain.  Genitourinary:  Negative for urgency.  Skin:  Negative for itching and rash.       Intermittent skin lesions on her abdominal fold above belly button. Was not present today.   Endo/Heme/Allergies:  Negative for polydipsia.      Objective:  BP 130/78   Temp 98.1 F (36.7 C) (Temporal)   Ht '5\' 5"'$  (1.651 m)   Wt 273 lb (123.8 kg)   BMI 45.43 kg/m  BP Readings from Last 3 Encounters:  10/02/22 130/78  07/31/22 102/80  06/19/22 120/64   Wt Readings from Last 3 Encounters:  10/02/22 273 lb (123.8 kg)  07/31/22 267 lb 4 oz (121.2 kg)  06/19/22 268 lb (121.6 kg)      Physical Exam Vitals reviewed.  Constitutional:      Appearance: Normal appearance.  Cardiovascular:     Rate and Rhythm: Normal rate and regular rhythm.     Pulses: Normal pulses.     Heart sounds: Normal heart sounds.  Pulmonary:     Effort: Pulmonary effort is normal.     Breath sounds: Normal breath sounds.  Skin:    Findings: Lesion present.  No erythema or rash.     Comments: skin lesions on her abdominal fold   Neurological:     Mental Status: She is alert and oriented to person, place, and time.  Psychiatric:        Mood and Affect: Mood normal.        Behavior: Behavior normal.      Results for orders placed or performed in visit on 10/02/22  POCT glycosylated hemoglobin (Hb A1C)  Result Value Ref Range   Hemoglobin A1C 6.2 (A) 4.0 - 5.6 %   HbA1c POC (<> result, manual entry)     HbA1c, POC (prediabetic range)     HbA1c, POC (controlled diabetic range)      Last hemoglobin A1c Lab Results  Component Value Date   HGBA1C 6.2 (A) 10/02/2022      The 10-year ASCVD risk score (Arnett DK, et al., 2019) is: 2.3%    Assessment & Plan:   Problem List Items Addressed This Visit       Endocrine   Type 2 diabetes mellitus with hyperglycemia ([place]) - Primary    Controlled with A1C reading of 6.2 today.   Due to multiple events of symptomatic hypoglycemic events; will decrease Glipizide 5 mg to once daily.   Continue Metformin 1000 mg twice daily.   Follow up in 6 months.        Relevant Orders   POCT glycosylated hemoglobin (Hb A1C) (Completed)     Musculoskeletal and Integument   Skin lesions    Unclear etiology. Based upon physical exam and symptoms, can rule out shingles or infection.   Suspect that it could be coming from sweat.   Recommend using otc hydrocortisone cream.   She will update if not better or symptoms worsen .      Other Visit Diagnoses     Need for immunization against influenza       Relevant Orders   Flu Vaccine QUAD 74moIM (Fluarix, Fluzone & Alfiuria Quad PF) (Completed)       No follow-ups on file.    BTinnie Gens BSN-RN, DNP STUDENT

## 2022-10-02 NOTE — Assessment & Plan Note (Addendum)
Improved and controlled with A1C reading of 6.2 today.   Due to multiple events of symptomatic hypoglycemic events; will decrease Glipizide 5 mg to once daily.   Continue Metformin 1000 mg twice daily.   Follow up in 6 months.  I evaluated patient, was consulted regarding treatment, and agree with assessment and plan per Tinnie Gens, RN, DNP student.   Allie Bossier, NP-C

## 2022-10-02 NOTE — Progress Notes (Signed)
Subjective:    Patient ID: Kelli Cox, female    DOB: 08-29-1976, 46 y.o.   MRN: [id number]  HPI  Kelli Cox is a very pleasant 46 y.o. female with a history of type 2 diabetes, female infertility, asthma, GAD who presents today for follow up of diabetes. She would like to discuss skin abnormality.  1) Type 2 Diabetes:  Current medications include: Glipizide 5 mg BID, metformin 1000 mg BID.   She is checking her blood glucose continuously and is getting readings of 90's-130's mostly.   She has experienced several episodes of hypoglycemia around 60's within the last week.   Last A1C: 8.9 in July, 6.2 today Last Eye Exam: UTD Last Foot Exam: UTD Pneumonia Vaccination: 2018 Urine Microalbumin: UTD Statin: None. Fertility treatment  Dietary changes since last visit: She began weight watchers, increased fruits and veggies, limiting sodas, no fast food.   Exercise: Walking 30-45 minutes daily.   2) Skin Abnormality: Acute for the last two months, wax and wane. Her spots are located under her top abdominal fold which are round and will crust over. She will sometimes notice the spots bleed initially. She does sweat under her abdominal folds. She's applied Vasaline without improvement.  No new lotions, detergents, soaps or shampoos. No new medicines, vitamins, supplements. No new pets. No recent outdoor exposure or poison ivy exposure. No bonfire or smoke exposure.  No recent motel or hotel stay or new beds.   No fevers/chills, oral lesions, new joint pains, tick bites, abdominal pain, nausea.    BP Readings from Last 3 Encounters:  10/02/22 130/78  07/31/22 102/80  06/19/22 120/64   Wt Readings from Last 3 Encounters:  10/02/22 273 lb (123.8 kg)  07/31/22 267 lb 4 oz (121.2 kg)  06/19/22 268 lb (121.6 kg)         Review of Systems  Respiratory:  Negative for shortness of breath.   Cardiovascular:  Negative for chest pain.  Skin:  Positive for color change.   Neurological:  Negative for dizziness.         Past Medical History:  Diagnosis Date   Acute non-recurrent sinusitis 10/17/2020   Asthma    Chicken pox    GAD (generalized anxiety disorder)    Migraines    Pain of upper abdomen 01/23/2021   Type 2 diabetes mellitus ([place])     Social History   Socioeconomic History   Marital status: Single    Spouse name: Not on file   Number of children: Not on file   Years of education: Not on file   Highest education level: Master's degree (e.g., MA, MS, MEng, MEd, MSW, MBA)  Occupational History   Occupation: Government social research officer  Tobacco Use   Smoking status: Never   Smokeless tobacco: Never  Vaping Use   Vaping Use: Never used  Substance and Sexual Activity   Alcohol use: Yes    Comment: 1 drink every two weeks    Drug use: Never   Sexual activity: Yes    Partners: Male    Birth control/protection: None  Other Topics Concern   Not on file  Social History Narrative   Not on file   Social Determinants of Health   Financial Resource Strain: Not on file  Food Insecurity: Not on file  Transportation Needs: Not on file  Physical Activity: Not on file  Stress: Not on file  Social Connections: Not on file  Intimate Partner Violence: Not on file  Past Surgical History:  Procedure Laterality Date   CHOLECYSTECTOMY  2008   COLONOSCOPY WITH PROPOFOL N/A 07/05/2021   Procedure: COLONOSCOPY WITH PROPOFOL;  Surgeon: Jonathon Bellows, MD;  Location: [facility] ENDOSCOPY;  Service: Gastroenterology;  Laterality: N/A;   LIPOMA RESECTION  2012   WRIST SURGERY Left    2001 and 2007    Family History  Problem Relation Age of Onset   Arthritis Mother    Asthma Mother    Depression Mother    Hyperlipidemia Mother    Hypertension Mother    Kidney disease Mother    Cancer Father    Diabetes Father    Early death Father    Hyperlipidemia Father    Hypertension Father    Heart failure Father    Asthma Sister    Hypertension Sister     46 / Stillbirths Sister    Breast cancer Paternal Aunt 75   Osteoarthritis Maternal Grandmother    Asthma Maternal Grandmother    Cancer Maternal Grandmother    COPD Maternal Grandmother    Depression Maternal Grandmother    Early death Maternal Grandmother    Osteoarthritis Maternal Grandfather    Asthma Maternal Grandfather    COPD Maternal Grandfather    Hypertension Maternal Grandfather    Hyperlipidemia Maternal Grandfather    Cancer Paternal Grandmother    Diabetes Paternal Grandmother    Diabetes Paternal Grandfather    Hyperlipidemia Paternal Grandfather    Heart failure Paternal Grandfather    Kidney disease Paternal Grandfather    Alcohol abuse Brother    Hypertension Brother    Hyperlipidemia Other    Heart failure Other    Kidney disease Other     Allergies  Allergen Reactions   Clarithromycin Nausea And Vomiting    Current Outpatient Medications on File Prior to Visit  Medication Sig Dispense Refill   albuterol (VENTOLIN HFA) 108 (90 Base) MCG/ACT inhaler Inhale 1-2 puffs into the lungs every 6 (six) hours as needed for wheezing or shortness of breath. 1 each 0   butalbital-aspirin-caffeine (FIORINAL) 50-325-40 MG capsule Take 1 capsule by mouth every 6 (six) hours as needed for headache. For headache/migraines 20 capsule 0   cetirizine (ZYRTEC) 10 MG tablet Take 1 tablet (10 mg total) by mouth daily. 30 tablet 11   Continuous Blood Gluc Sensor (FREESTYLE LIBRE 3 SENSOR) MISC USE CONTINUOUSLY TO CHECK GLUCOSE READINGS, CHANGE SENSOR EVERY 2 WEEKS. 6 each 1   diclofenac (VOLTAREN) 75 MG EC tablet Take 1 tablet (75 mg total) by mouth 2 (two) times daily. 30 tablet 0   Fluticasone-Salmeterol (ADVAIR) 100-50 MCG/DOSE AEPB Inhale into the lungs as needed.     gabapentin (NEURONTIN) 100 MG capsule Take 1 capsule (100 mg total) by mouth 2 (two) times daily. 60 capsule 0   glipiZIDE (GLUCOTROL) 5 MG tablet Take 1 tablet (5 mg total) by mouth 2 (two) times daily  before a meal. for diabetes. 180 tablet 1   hydrOXYzine (ATARAX) 10 MG tablet TAKE 1-2 TABLETS (10-20 MG TOTAL) BY MOUTH 2 (TWO) TIMES DAILY AS NEEDED FOR ANXIETY. 60 tablet 0   letrozole (FEMARA) 2.5 MG tablet Take 2.5 mg by mouth daily.     metFORMIN (GLUCOPHAGE) 1000 MG tablet Take 1 tablet (1,000 mg total) by mouth 2 (two) times daily with a meal. For diabetes. 180 tablet 0   Multiple Vitamins-Minerals (MULTIVITAMIN WITH MINERALS) tablet Take 1 tablet by mouth daily.     norethindrone (AYGESTIN) 5 MG tablet Take 2.5 mg  by mouth 2 (two) times daily.     amoxicillin-clavulanate (AUGMENTIN) 875-125 MG tablet Take 1 tablet by mouth 2 (two) times daily. 14 tablet 0   ondansetron (ZOFRAN-ODT) 4 MG disintegrating tablet Take 1-2 tablets (4-8 mg total) by mouth every 8 (eight) hours as needed for nausea or vomiting. 20 tablet 0   promethazine (PHENERGAN) 25 MG tablet Take 1 tablet (25 mg total) by mouth every 6 (six) hours as needed for nausea or vomiting. (Patient not taking: Reported on 10/02/2022) 30 tablet 6   sertraline (ZOLOFT) 50 MG tablet TAKE 1 TABLET (50 MG TOTAL) BY MOUTH DAILY. FOR ANXIETY. (Patient not taking: Reported on 10/02/2022) 90 tablet 2   tiZANidine (ZANAFLEX) 4 MG tablet Take 1 tablet (4 mg total) by mouth every 8 (eight) hours as needed for muscle spasms. 30 tablet 3   vitamin E 180 MG (400 UNITS) capsule Take 400 Units by mouth daily. (Patient not taking: Reported on 10/02/2022)     No current facility-administered medications on file prior to visit.    BP 130/78   Temp 98.1 F (36.7 C) (Temporal)   Ht '5\' 5"'$  (1.651 m)   Wt 273 lb (123.8 kg)   BMI 45.43 kg/m  Objective:   Physical Exam Cardiovascular:     Rate and Rhythm: Normal rate and regular rhythm.  Pulmonary:     Effort: Pulmonary effort is normal.     Breath sounds: Normal breath sounds.  Musculoskeletal:     Cervical back: Neck supple.  Skin:    General: Skin is warm and dry.     Comments: Several small,  light brown, flat, mildly scaly spots above umbilicus measuring approx 0.25 cm. Non tender. No bleeding. No surrounding erythema.            Assessment & Plan:   Problem List Items Addressed This Visit       Endocrine   Type 2 diabetes mellitus with hyperglycemia ([place]) - Primary    Improved and controlled with A1C reading of 6.2 today.   Due to multiple events of symptomatic hypoglycemic events; will decrease Glipizide 5 mg to once daily.   Continue Metformin 1000 mg twice daily.   Follow up in 6 months.  I evaluated patient, was consulted regarding treatment, and agree with assessment and plan per Tinnie Gens, RN, DNP student.   Allie Bossier, NP-C         Relevant Orders   POCT glycosylated hemoglobin (Hb A1C) (Completed)     Musculoskeletal and Integument   Skin lesions    Unclear etiology. Based upon physical exam and symptoms, does not appear to be shingles or infection.   Suspect that it could be coming from sweat.   Recommend using otc hydrocortisone cream.   She will update if not better or symptoms worsen .  I evaluated patient, was consulted regarding treatment, and agree with assessment and plan per Tinnie Gens, RN, DNP student.   Allie Bossier, NP-C       Other Visit Diagnoses     Need for immunization against influenza       Relevant Orders   Flu Vaccine QUAD 29moIM (Fluarix, Fluzone & Alfiuria Quad PF) (Completed)          KPleas Koch NP

## 2022-10-02 NOTE — Patient Instructions (Signed)
Great job on your A1C.   Will go ahead and decrease Glipizide 5 mg to once daily. Continue Metformin 1000 mg twice daily.   Please update if you continue to notice blood sugars below 60 or above 250.   Continue to monitor your diet and exercise.   Follow up in 6 months.   It was a pleasure to see you today!

## 2022-10-02 NOTE — Assessment & Plan Note (Addendum)
Unclear etiology. Based upon physical exam and symptoms, does not appear to be shingles or infection.   Suspect that it could be coming from sweat.   Recommend using otc hydrocortisone cream.   She will update if not better or symptoms worsen .  I evaluated patient, was consulted regarding treatment, and agree with assessment and plan per Tinnie Gens, RN, DNP student.   Kelli Bossier, NP-C

## 2022-10-27 ENCOUNTER — Telehealth: Payer: 59 | Admitting: Physician Assistant

## 2022-10-27 DIAGNOSIS — J019 Acute sinusitis, unspecified: Secondary | ICD-10-CM

## 2022-10-27 MED ORDER — FLUTICASONE PROPIONATE 50 MCG/ACT NA SUSP: 2.0000 | Freq: Every day | NASAL | 0 refills | Status: DC

## 2022-10-27 MED ORDER — AMOXICILLIN-POT CLAVULANATE 875-125 MG PO TABS: 1.0000 | ORAL_TABLET | Freq: Two times a day (BID) | ORAL | 0 refills | Status: AC

## 2022-10-27 NOTE — Progress Notes (Signed)
E-Visit for Sinus Problems  We are sorry that you are not feeling well.  Here is how we plan to help!  Based on what you have shared with me it looks like you have sinusitis.  Sinusitis is inflammation and infection in the sinus cavities of the head.  Based on your presentation I believe you most likely have Acute Bacterial Sinusitis.  This is an infection caused by bacteria and is treated with antibiotics. I have prescribed Augmentin '875mg'$ /'125mg'$  one tablet twice daily with food, for 7 days. I have also prescribed nasal spray called Fluticasone for your symptoms. You may use an oral decongestant such as Mucinex D or if you have glaucoma or high blood pressure use plain Mucinex. Saline nasal spray help and can safely be used as often as needed for congestion.  If you develop worsening sinus pain, fever or notice severe headache and vision changes, or if symptoms are not better after completion of antibiotic, please schedule an appointment with a health care provider.    Sinus infections are not as easily transmitted as other respiratory infection, however we still recommend that you avoid close contact with loved ones, especially the very young and elderly.  Remember to wash your hands thoroughly throughout the day as this is the number one way to prevent the spread of infection!  Home Care: Only take medications as instructed by your medical team. Complete the entire course of an antibiotic. Do not take these medications with alcohol. A steam or ultrasonic humidifier can help congestion.  You can place a towel over your head and breathe in the steam from hot water coming from a faucet. Avoid close contacts especially the very young and the elderly. Cover your mouth when you cough or sneeze. Always remember to wash your hands.  Get Help Right Away If: You develop worsening fever or sinus pain. You develop a severe head ache or visual changes. Your symptoms persist after you have completed your  treatment plan.  Make sure you Understand these instructions. Will watch your condition. Will get help right away if you are not doing well or get worse.  Thank you for choosing an e-visit.  Your e-visit answers were reviewed by a board certified advanced clinical practitioner to complete your personal care plan. Depending upon the condition, your plan could have included both over the counter or prescription medications.  Please review your pharmacy choice. Make sure the pharmacy is open so you can pick up prescription now. If there is a problem, you may contact your provider through [employer] and have the prescription routed to another pharmacy.  Your safety is important to [place]. If you have drug allergies check your prescription carefully.   For the next 24 hours you can use MyChart to ask questions about today's visit, request a non-urgent call back, or ask for a work or school excuse. You will get an email in the next two days asking about your experience. I hope that your e-visit has been valuable and will speed your recovery.   Approximately 5 minutes was spent documenting and reviewing patient's chart.

## 2022-10-30 ENCOUNTER — Telehealth: Payer: 59 | Admitting: Physician Assistant

## 2022-10-30 DIAGNOSIS — B9689 Other specified bacterial agents as the cause of diseases classified elsewhere: Secondary | ICD-10-CM

## 2022-10-30 NOTE — Progress Notes (Signed)
Because of worsening/changing symptoms despite antibiotics given at beginning of the week, I feel your condition warrants further evaluation and I recommend that you be seen in a face to face visit.   NOTE: There will be NO CHARGE for this eVisit   If you are having a true medical emergency please call 911.      For an urgent face to face visit, [facility] has seven urgent care centers for your convenience:     [facility] [place] [facility] [place] Get Driving Directions [phone] [place] [place], [place] [address]    [place] [facility] [place] [facility]) Get Driving Directions [phone] [place], [place] [address]  [facility] [place] ([place]) Get Driving Directions [phone] [address] [employer] [place],  [place]  [address]  [place] at [address] [place] Get Driving Directions  [phone] [place],  [place] [address]   [facility] Get Driving Directions [phone] [address] [place], [place], [place] [address]   [facility] Get Driving Directions  [phone] [address] [place], [place] [address]   [facility] [place] Get Driving Directions [phone] [address], [place], [place] [address]  Your MyChart E-visit questionnaire answers were reviewed by a board certified advanced clinical practitioner to complete your personal care plan based on your specific symptoms.  Thank you for using e-Visits.

## 2022-10-31 ENCOUNTER — Ambulatory Visit: Payer: 59 | Admitting: Primary Care

## 2022-10-31 VITALS — BP 118/86 | HR 85 | Temp 97.9°F | Ht 65.0 in | Wt 272.2 lb

## 2022-10-31 DIAGNOSIS — J4520 Mild intermittent asthma, uncomplicated: Secondary | ICD-10-CM | POA: Diagnosis not present

## 2022-10-31 DIAGNOSIS — J4521 Mild intermittent asthma with (acute) exacerbation: Secondary | ICD-10-CM | POA: Insufficient documentation

## 2022-10-31 DIAGNOSIS — R051 Acute cough: Secondary | ICD-10-CM | POA: Diagnosis not present

## 2022-10-31 HISTORY — DX: Mild intermittent asthma with (acute) exacerbation: J45.21

## 2022-10-31 LAB — POC COVID19 BINAXNOW: SARS Coronavirus 2 Ag: NEGATIVE

## 2022-10-31 LAB — POCT INFLUENZA A/B
Influenza A, POC: NEGATIVE
Influenza B, POC: NEGATIVE

## 2022-10-31 MED ORDER — FLUTICASONE-SALMETEROL 100-50 MCG/ACT IN AEPB: 1.0000 | INHALATION_SPRAY | Freq: Two times a day (BID) | RESPIRATORY_TRACT | 2 refills | Status: DC

## 2022-10-31 MED ORDER — PREDNISONE 20 MG PO TABS: ORAL_TABLET | ORAL | 0 refills | Status: DC

## 2022-10-31 NOTE — Assessment & Plan Note (Addendum)
Early asthma flare with current respiratory infection.  Continue Augmentin 875-125 mg BID. Start prednisone 20 mg tablets. Take 2 tablets by mouth once daily in the morning for 5 days. Use albuterol PRN. Continue Advair 100-50 mcg BID.  Return precautions provided.   Flu and Covid tests are negative.

## 2022-10-31 NOTE — Assessment & Plan Note (Signed)
Continue Advair 100-50 mcg BID for now. Use albuterol inhaler PRN.

## 2022-10-31 NOTE — Progress Notes (Signed)
Subjective:    Patient ID: Kelli Cox, female    DOB: 02-08-1976, 46 y.o.   MRN: [id number]  Sinus Problem Associated symptoms include congestion, coughing, headaches, shortness of breath and sinus pressure.    Kelli Cox is a very pleasant 46 y.o. female with a history of type 2 diabetes, asthma, migraines  who presents today to discuss sinus pressure.   Symptom onset 8 days ago with sinus headache to the left frontal lobe. She then began to notice sinus pressure behind her eyes, cough, wheezing, fevers.   Evaluated through an [place] per [facility] on 10/27/22, treated for acute sinusitis and was prescribed Augmentin 875-125 mg BID x 7 days.   She's been compliant to her regimen of Augmentin as of four days. She's now experiencing diarrhea. Her sinus pressure has improved some but she feels that her asthma is flaring. Symptoms now include cough, chest tightness, wheezing.  Temperatures are running 100-101. She has been taking Mucinex, Dayquil, Tylenol, and Ibuprofen. She took home Covid-19 test yesterday. She's been around her husband who has had the same symptoms.   She resumed her Advair inhaler four days ago, uses this seasonally, is nearly out. She's not had to use her albuterol inhaler.    Review of Systems  Constitutional:  Positive for fever.  HENT:  Positive for congestion and sinus pressure.   Respiratory:  Positive for cough, chest tightness and shortness of breath.   Neurological:  Positive for headaches.         Past Medical History:  Diagnosis Date   Acute non-recurrent sinusitis 10/17/2020   Asthma    Chicken pox    GAD (generalized anxiety disorder)    Migraines    Pain of upper abdomen 01/23/2021   Type 2 diabetes mellitus ([place])     Social History   Socioeconomic History   Marital status: Single    Spouse name: Not on file   Number of children: Not on file   Years of education: Not on file   Highest education level: Master's degree (e.g., MA,  MS, MEng, MEd, MSW, MBA)  Occupational History   Occupation: Government social research officer  Tobacco Use   Smoking status: Never   Smokeless tobacco: Never  Vaping Use   Vaping Use: Never used  Substance and Sexual Activity   Alcohol use: Yes    Comment: 1 drink every two weeks    Drug use: Never   Sexual activity: Yes    Partners: Male    Birth control/protection: None  Other Topics Concern   Not on file  Social History Narrative   Not on file   Social Determinants of Health   Financial Resource Strain: Not on file  Food Insecurity: Not on file  Transportation Needs: Not on file  Physical Activity: Not on file  Stress: Not on file  Social Connections: Not on file  Intimate Partner Violence: Not on file    Past Surgical History:  Procedure Laterality Date   CHOLECYSTECTOMY  2008   COLONOSCOPY WITH PROPOFOL N/A 07/05/2021   Procedure: COLONOSCOPY WITH PROPOFOL;  Surgeon: Jonathon Bellows, MD;  Location: [facility] ENDOSCOPY;  Service: Gastroenterology;  Laterality: N/A;   LIPOMA RESECTION  2012   WRIST SURGERY Left    2001 and 2007    Family History  Problem Relation Age of Onset   Arthritis Mother    Asthma Mother    Depression Mother    Hyperlipidemia Mother    Hypertension Mother  Kidney disease Mother    Cancer Father    Diabetes Father    Early death Father    Hyperlipidemia Father    Hypertension Father    Heart failure Father    Asthma Sister    Hypertension Sister    35 / Stillbirths Sister    Breast cancer Paternal Aunt 90   Osteoarthritis Maternal Grandmother    Asthma Maternal Grandmother    Cancer Maternal Grandmother    COPD Maternal Grandmother    Depression Maternal Grandmother    Early death Maternal Grandmother    Osteoarthritis Maternal Grandfather    Asthma Maternal Grandfather    COPD Maternal Grandfather    Hypertension Maternal Grandfather    Hyperlipidemia Maternal Grandfather    Cancer Paternal Grandmother    Diabetes Paternal Grandmother     Diabetes Paternal Grandfather    Hyperlipidemia Paternal Grandfather    Heart failure Paternal Grandfather    Kidney disease Paternal Grandfather    Alcohol abuse Brother    Hypertension Brother    Hyperlipidemia Other    Heart failure Other    Kidney disease Other     Allergies  Allergen Reactions   Clarithromycin Nausea And Vomiting    Current Outpatient Medications on File Prior to Visit  Medication Sig Dispense Refill   albuterol (VENTOLIN HFA) 108 (90 Base) MCG/ACT inhaler Inhale 1-2 puffs into the lungs every 6 (six) hours as needed for wheezing or shortness of breath. 1 each 0   amoxicillin-clavulanate (AUGMENTIN) 875-125 MG tablet Take 1 tablet by mouth 2 (two) times daily for 7 days. 14 tablet 0   butalbital-aspirin-caffeine (FIORINAL) 50-325-40 MG capsule Take 1 capsule by mouth every 6 (six) hours as needed for headache. For headache/migraines 20 capsule 0   cetirizine (ZYRTEC) 10 MG tablet Take 1 tablet (10 mg total) by mouth daily. 30 tablet 11   Continuous Blood Gluc Sensor (FREESTYLE LIBRE 3 SENSOR) MISC USE CONTINUOUSLY TO CHECK GLUCOSE READINGS, CHANGE SENSOR EVERY 2 WEEKS. 6 each 1   diclofenac (VOLTAREN) 75 MG EC tablet Take 1 tablet (75 mg total) by mouth 2 (two) times daily. 30 tablet 0   fluticasone (FLONASE) 50 MCG/ACT nasal spray Place 2 sprays into both nostrils daily. 16 g 0   gabapentin (NEURONTIN) 100 MG capsule Take 1 capsule (100 mg total) by mouth 2 (two) times daily. 60 capsule 0   glipiZIDE (GLUCOTROL) 5 MG tablet Take 1 tablet (5 mg total) by mouth 2 (two) times daily before a meal. for diabetes. 180 tablet 1   hydrOXYzine (ATARAX) 10 MG tablet TAKE 1-2 TABLETS (10-20 MG TOTAL) BY MOUTH 2 (TWO) TIMES DAILY AS NEEDED FOR ANXIETY. 60 tablet 0   letrozole (FEMARA) 2.5 MG tablet Take 2.5 mg by mouth daily.     metFORMIN (GLUCOPHAGE) 1000 MG tablet Take 1 tablet (1,000 mg total) by mouth 2 (two) times daily with a meal. For diabetes. 180 tablet 0    Multiple Vitamins-Minerals (MULTIVITAMIN WITH MINERALS) tablet Take 1 tablet by mouth daily.     norethindrone (AYGESTIN) 5 MG tablet Take 2.5 mg by mouth 2 (two) times daily.     ondansetron (ZOFRAN-ODT) 4 MG disintegrating tablet Take 1-2 tablets (4-8 mg total) by mouth every 8 (eight) hours as needed for nausea or vomiting. 20 tablet 0   promethazine (PHENERGAN) 25 MG tablet Take 1 tablet (25 mg total) by mouth every 6 (six) hours as needed for nausea or vomiting. (Patient not taking: Reported on 10/02/2022) 30 tablet  6   sertraline (ZOLOFT) 50 MG tablet TAKE 1 TABLET (50 MG TOTAL) BY MOUTH DAILY. FOR ANXIETY. (Patient not taking: Reported on 10/02/2022) 90 tablet 2   tiZANidine (ZANAFLEX) 4 MG tablet Take 1 tablet (4 mg total) by mouth every 8 (eight) hours as needed for muscle spasms. 30 tablet 3   vitamin E 180 MG (400 UNITS) capsule Take 400 Units by mouth daily. (Patient not taking: Reported on 10/02/2022)     No current facility-administered medications on file prior to visit.    BP 118/86   Pulse 85   Temp 97.9 F (36.6 C) (Oral)   Ht '5\' 5"'$  (1.651 m)   Wt 272 lb 3.2 oz (123.5 kg)   SpO2 98%   BMI 45.30 kg/m  Objective:   Physical Exam Constitutional:      Appearance: She is ill-appearing.  HENT:     Right Ear: Tympanic membrane and ear canal normal.     Left Ear: Tympanic membrane and ear canal normal.     Nose:     Right Sinus: No maxillary sinus tenderness or frontal sinus tenderness.     Left Sinus: No maxillary sinus tenderness or frontal sinus tenderness.     Mouth/Throat:     Pharynx: No posterior oropharyngeal erythema.  Eyes:     Conjunctiva/sclera: Conjunctivae normal.  Cardiovascular:     Rate and Rhythm: Normal rate and regular rhythm.  Pulmonary:     Effort: Pulmonary effort is normal.     Breath sounds: Examination of the left-upper field reveals wheezing. Examination of the left-lower field reveals wheezing. Wheezing present. No rales.     Comments:  Bronchospasm type cough noted during visit several times Musculoskeletal:     Cervical back: Neck supple.  Lymphadenopathy:     Cervical: Cervical adenopathy present.  Skin:    General: Skin is warm and dry.           Assessment & Plan:   Problem List Items Addressed This Visit       Respiratory   Asthma    Continue Advair 100-50 mcg BID for now. Use albuterol inhaler PRN.      Relevant Medications   predniSONE (DELTASONE) 20 MG tablet   fluticasone-salmeterol (ADVAIR) 100-50 MCG/ACT AEPB   Mild intermittent asthma with (acute) exacerbation    Early asthma flare with current respiratory infection.  Continue Augmentin 875-125 mg BID. Start prednisone 20 mg tablets. Take 2 tablets by mouth once daily in the morning for 5 days. Use albuterol PRN. Continue Advair 100-50 mcg BID.  Return precautions provided.   Flu and Covid tests are negative.      Relevant Medications   predniSONE (DELTASONE) 20 MG tablet   fluticasone-salmeterol (ADVAIR) 100-50 MCG/ACT AEPB   Other Visit Diagnoses     Acute cough    -  Primary   Relevant Orders   Influenza A/B   POC COVID-19          Pleas Koch, NP

## 2022-11-03 ENCOUNTER — Other Ambulatory Visit: Payer: Self-pay | Admitting: Primary Care

## 2022-11-03 DIAGNOSIS — F411 Generalized anxiety disorder: Secondary | ICD-10-CM

## 2022-11-11 ENCOUNTER — Other Ambulatory Visit: Payer: Self-pay | Admitting: Primary Care

## 2022-11-11 DIAGNOSIS — F411 Generalized anxiety disorder: Secondary | ICD-10-CM

## 2022-11-18 ENCOUNTER — Ambulatory Visit: Payer: 59 | Admitting: Nurse Practitioner

## 2022-11-18 ENCOUNTER — Encounter: Payer: Self-pay | Admitting: Nurse Practitioner

## 2022-11-18 VITALS — BP 128/82 | HR 91 | Resp 16 | Ht 65.0 in | Wt 270.2 lb

## 2022-11-18 DIAGNOSIS — R0602 Shortness of breath: Secondary | ICD-10-CM

## 2022-11-18 DIAGNOSIS — R509 Fever, unspecified: Secondary | ICD-10-CM | POA: Diagnosis not present

## 2022-11-18 DIAGNOSIS — R051 Acute cough: Secondary | ICD-10-CM | POA: Diagnosis not present

## 2022-11-18 DIAGNOSIS — H6993 Unspecified Eustachian tube disorder, bilateral: Secondary | ICD-10-CM

## 2022-11-18 DIAGNOSIS — J101 Influenza due to other identified influenza virus with other respiratory manifestations: Secondary | ICD-10-CM

## 2022-11-18 HISTORY — DX: Shortness of breath: R06.02

## 2022-11-18 LAB — POCT INFLUENZA A/B
Influenza A, POC: POSITIVE — AB
Influenza B, POC: NEGATIVE

## 2022-11-18 MED ORDER — BENZONATATE 200 MG PO CAPS: 200.0000 mg | ORAL_CAPSULE | Freq: Two times a day (BID) | ORAL | 0 refills | Status: DC | PRN

## 2022-11-18 MED ORDER — PREDNISONE 20 MG PO TABS: ORAL_TABLET | ORAL | 0 refills | Status: DC

## 2022-11-18 NOTE — Assessment & Plan Note (Signed)
Influenza A positive in office.  Can use over-the-counter analgesics as needed for fever and discomfort.  Rest drink plenty of fluid

## 2022-11-18 NOTE — Assessment & Plan Note (Signed)
Influenza A positive in office.  Patient is outside the window for antiviral treatment.  Supportive care

## 2022-11-18 NOTE — Assessment & Plan Note (Signed)
Will do prednisone taper

## 2022-11-18 NOTE — Assessment & Plan Note (Signed)
Patient experiencing shortness of breath.  No wheezing on auscultation will do prednisone taper.  Prednisone precautions reviewed

## 2022-11-18 NOTE — Progress Notes (Signed)
Acute Office Visit  Subjective:     Patient ID: Kelli Cox, female    DOB: 02/24/1976, 46 y.o.   MRN: [id number]  Chief Complaint  Patient presents with   Cough    X 4 days   Fever   Nasal Congestion    Neg home test 11/17/2022     Patient is in today for Sick symptoms  Started on Friday States that her brother has been ill. States that his is just a cough.  At home covid test was negative yesterday Flu vaccine is up to date Covid vaccine: pfizer x2 States that she has been taking dayquill, benadryl, flonase, and adviar, no relief    Review of Systems  Constitutional:  Positive for chills (last night) and fever. Negative for malaise/fatigue.       Appetite has been decreased Fluid intake is good   HENT:  Positive for ear pain (full feeling). Negative for ear discharge, sinus pain and sore throat (irritatoin).   Respiratory:  Positive for cough, sputum production (yellow/green) and shortness of breath.   Gastrointestinal:  Negative for abdominal pain, diarrhea, nausea and vomiting.  Musculoskeletal:  Negative for joint pain and myalgias.  Neurological:  Negative for headaches.        Objective:    BP 128/82   Pulse 91   Resp 16   Ht '5\' 5"'$  (1.651 m)   Wt 270 lb 4 oz (122.6 kg)   LMP 10/04/2022   SpO2 98%   BMI 44.97 kg/m    Physical Exam Nursing note reviewed.  Constitutional:      Appearance: Normal appearance. She is obese.  HENT:     Right Ear: Ear canal and external ear normal.     Left Ear: Ear canal and external ear normal.     Mouth/Throat:     Mouth: Mucous membranes are moist.     Pharynx: Oropharynx is clear.  Cardiovascular:     Rate and Rhythm: Normal rate and regular rhythm.     Heart sounds: Normal heart sounds.  Pulmonary:     Effort: Pulmonary effort is normal.     Breath sounds: Normal breath sounds.  Lymphadenopathy:     Cervical: No cervical adenopathy.  Neurological:     Mental Status: She is alert.     Results for  orders placed or performed in visit on 11/18/22  POCT Influenza A/B  Result Value Ref Range   Influenza A, POC Positive (A) Negative   Influenza B, POC Negative Negative        Assessment & Plan:   Problem List Items Addressed This Visit       Respiratory   Influenza A    Influenza A positive in office.  Patient is outside the window for antiviral treatment.  Supportive care        Nervous and Auditory   Eustachian tube dysfunction, bilateral    Will do prednisone taper        Other   Fever and chills    Influenza A positive in office.  Can use over-the-counter analgesics as needed for fever and discomfort.  Rest drink plenty of fluid      Relevant Orders   POCT Influenza A/B (Completed)   Shortness of breath    Patient experiencing shortness of breath.  No wheezing on auscultation will do prednisone taper.  Prednisone precautions reviewed      Relevant Medications   predniSONE (DELTASONE) 20 MG tablet   Acute cough -  Primary    Flu test positive in office.  Tessalon Perles as needed cough      Relevant Medications   benzonatate (TESSALON) 200 MG capsule   Other Relevant Orders   POCT Influenza A/B (Completed)    Meds ordered this encounter  Medications   predniSONE (DELTASONE) 20 MG tablet    Sig: Take 1 tablet (20 mg total) by mouth 2 (two) times daily with a meal for 4 days, THEN 1 tablet (20 mg total) daily with breakfast for 4 days. Avoid NSAIDS like ibuprofen, motrin, Aleve, naproxen, BC/Goody powder.    Dispense:  12 tablet    Refill:  0    Order Specific Question:   Supervising Provider    Answer:   Glori Bickers MARNE A [[id number]]   benzonatate (TESSALON) 200 MG capsule    Sig: Take 1 capsule (200 mg total) by mouth 2 (two) times daily as needed for cough.    Dispense:  20 capsule    Refill:  0    Order Specific Question:   Supervising Provider    Answer:   TOWER, MARNE A [[id number]]    Return if symptoms worsen or fail to improve.  Romilda Garret, NP

## 2022-11-18 NOTE — Assessment & Plan Note (Signed)
Flu test positive in office.  Tessalon Perles as needed cough

## 2022-11-18 NOTE — Patient Instructions (Signed)
Nice to see you today Rest, drink plenty of fluid, tylenol as needed for fever and aches Follow up if you do not improve

## 2022-11-23 ENCOUNTER — Ambulatory Visit (INDEPENDENT_AMBULATORY_CARE_PROVIDER_SITE_OTHER): Payer: 59

## 2022-11-23 ENCOUNTER — Ambulatory Visit
Admission: EM | Admit: 2022-11-23 | Discharge: 2022-11-23 | Disposition: A | Discharge status: Home / Self Care | Payer: 59 | Attending: Urgent Care | Admitting: Urgent Care

## 2022-11-23 DIAGNOSIS — R0602 Shortness of breath: Secondary | ICD-10-CM

## 2022-11-23 DIAGNOSIS — J4521 Mild intermittent asthma with (acute) exacerbation: Secondary | ICD-10-CM

## 2022-11-23 DIAGNOSIS — J22 Unspecified acute lower respiratory infection: Secondary | ICD-10-CM

## 2022-11-23 MED ORDER — AMOXICILLIN-POT CLAVULANATE 875-125 MG PO TABS: 1.0000 | ORAL_TABLET | Freq: Two times a day (BID) | ORAL | 0 refills | Status: DC

## 2022-11-23 MED ORDER — PREDNISONE 10 MG PO TABS: ORAL_TABLET | ORAL | 0 refills | Status: AC

## 2022-11-23 MED ORDER — DEXAMETHASONE SODIUM PHOSPHATE 10 MG/ML IJ SOLN
10.0000 mg | Freq: Once | INTRAMUSCULAR | Status: AC
  Administered 2022-11-23: 10 mg via INTRAMUSCULAR

## 2022-11-23 NOTE — ED Triage Notes (Signed)
Pt. Was diagnosed with the FLU 5 days ago. Pt. Reports worsening of her cough and wheezing.

## 2022-11-23 NOTE — Discharge Instructions (Addendum)
You have been prescribed a longer course of prednisone for your symptoms of asthma exacerbation.  This medication will cause your blood sugars to be higher than normal.  Recommend that you watch diet carefully over the course of the next 2 weeks to help control your blood sugars.  Follow up here or with your primary care provider if your symptoms are worsening or not improving.

## 2022-11-23 NOTE — ED Provider Notes (Signed)
Kelli Cox    CSN: [id number] Arrival date & time: 11/23/22  0854      History   Chief Complaint Chief Complaint  Patient presents with   Cough   Wheezing    HPI Kelli Cox is a 46 y.o. female.    Cough Associated symptoms: wheezing   Wheezing Associated symptoms: cough     Patient diagnosed with influenza 5 days ago.  She reports worsening of her cough and wheezing.  PMH including asthma and GAD.  Heart indicates office visit on 11/18/2022 where she was diagnosed with influenza A based on POC test.  She was prescribed prednisone taper and benzonatate for cough.  Past Medical History:  Diagnosis Date   Acute non-recurrent sinusitis 10/17/2020   Asthma    Chicken pox    GAD (generalized anxiety disorder)    Migraines    Pain of upper abdomen 01/23/2021   Type 2 diabetes mellitus ([place])     Patient Active Problem List   Diagnosis Date Noted   Eustachian tube dysfunction, bilateral 11/18/2022   Fever and chills 11/18/2022   Shortness of breath 11/18/2022   Acute cough 11/18/2022   Influenza A 11/18/2022   Mild intermittent asthma with (acute) exacerbation 10/31/2022   Skin lesions 10/02/2022   Left leg numbness 07/31/2022   Leg pain, lateral, left 07/31/2022   Female infertility 06/19/2022   Nausea vomiting and diarrhea 05/07/2022   Concussion with no loss of consciousness 08/27/2021   Family history of ovarian cancer 05/29/2021   Family history of breast cancer 05/29/2021   Type 2 diabetes mellitus with hyperglycemia ([place]) 10/01/2020   Migraines 10/01/2020   Asthma 10/01/2020   GAD (generalized anxiety disorder) 10/01/2020    Past Surgical History:  Procedure Laterality Date   CHOLECYSTECTOMY  2008   COLONOSCOPY WITH PROPOFOL N/A 07/05/2021   Procedure: COLONOSCOPY WITH PROPOFOL;  Surgeon: Jonathon Bellows, MD;  Location: [facility] ENDOSCOPY;  Service: Gastroenterology;  Laterality: N/A;   LIPOMA RESECTION  2012   WRIST SURGERY Left    2001 and  2007    OB History     Gravida  1   Para      Term      Preterm      AB  1   Living         SAB  1   IAB      Ectopic      Multiple      Live Births               Home Medications    Prior to Admission medications   Medication Sig Start Date End Date Taking? Authorizing Provider  albuterol (VENTOLIN HFA) 108 (90 Base) MCG/ACT inhaler Inhale 1-2 puffs into the lungs every 6 (six) hours as needed for wheezing or shortness of breath. 03/30/21   Verda Cumins, MD  benzonatate (TESSALON) 200 MG capsule Take 1 capsule (200 mg total) by mouth 2 (two) times daily as needed for cough. 11/18/22   Michela Pitcher, NP  butalbital-aspirin-caffeine [facility]) [phone] MG capsule Take 1 capsule by mouth every 6 (six) hours as needed for headache. For headache/migraines 05/23/21   Pleas Koch, NP  cetirizine (ZYRTEC) 10 MG tablet Take 1 tablet (10 mg total) by mouth daily. 10/10/20   Sharion Balloon, FNP  Continuous Blood Gluc Sensor (FREESTYLE LIBRE 3 SENSOR) MISC USE CONTINUOUSLY TO CHECK GLUCOSE READINGS, CHANGE SENSOR EVERY 2 WEEKS. 09/15/22   Pleas Koch, NP  diclofenac (VOLTAREN) 75 MG EC tablet Take 1 tablet (75 mg total) by mouth 2 (two) times daily. Patient not taking: Reported on 11/18/2022 07/31/22   Jinny Sanders, MD  fluticasone (FLONASE) 50 MCG/ACT nasal spray Place 2 sprays into both nostrils daily. 10/27/22   Couture, Cortni S, PA-C  fluticasone-salmeterol (ADVAIR) 100-50 MCG/ACT AEPB Inhale 1 puff into the lungs 2 (two) times daily. 10/31/22   Pleas Koch, NP  gabapentin (NEURONTIN) 100 MG capsule Take 1 capsule (100 mg total) by mouth 2 (two) times daily. Patient not taking: Reported on 11/18/2022 08/13/22   Jinny Sanders, MD  glipiZIDE (GLUCOTROL) 5 MG tablet Take 1 tablet (5 mg total) by mouth 2 (two) times daily before a meal. for diabetes. 06/19/22   Pleas Koch, NP  hydrOXYzine (ATARAX) 10 MG tablet TAKE 1-2 TABLETS (10-20 MG TOTAL)  BY MOUTH 2 (TWO) TIMES DAILY AS NEEDED FOR ANXIETY. 11/03/22   Pleas Koch, NP  letrozole [facility]) 2.5 MG tablet Take 2.5 mg by mouth daily. 11/05/21   [provider]  metFORMIN (GLUCOPHAGE) 1000 MG tablet Take 1 tablet (1,000 mg total) by mouth 2 (two) times daily with a meal. For diabetes. 07/28/22   Pleas Koch, NP  Multiple Vitamins-Minerals (MULTIVITAMIN WITH MINERALS) tablet Take 1 tablet by mouth daily.    [provider]  norethindrone (AYGESTIN) 5 MG tablet Take 2.5 mg by mouth 2 (two) times daily. 07/22/22   [provider]  predniSONE (DELTASONE) 20 MG tablet Take 1 tablet (20 mg total) by mouth 2 (two) times daily with a meal for 4 days, THEN 1 tablet (20 mg total) daily with breakfast for 4 days. Avoid NSAIDS like ibuprofen, motrin, Aleve, naproxen, BC/Goody powder. 11/18/22 11/26/22  Michela Pitcher, NP  promethazine (PHENERGAN) 25 MG tablet Take 1 tablet (25 mg total) by mouth every 6 (six) hours as needed for nausea or vomiting. Patient not taking: Reported on 10/02/2022 05/16/22   Gregor Hams, MD  sertraline (ZOLOFT) 50 MG tablet TAKE 1 TABLET (50 MG TOTAL) BY MOUTH DAILY. FOR ANXIETY. 09/08/22   Pleas Koch, NP  vitamin E 180 MG (400 UNITS) capsule Take 400 Units by mouth daily. Patient not taking: Reported on 10/02/2022    [provider]    Family History Family History  Problem Relation Age of Onset   Arthritis Mother    Asthma Mother    Depression Mother    Hyperlipidemia Mother    Hypertension Mother    Kidney disease Mother    Cancer Father    Diabetes Father    Early death Father    Hyperlipidemia Father    Hypertension Father    Heart failure Father    Asthma Sister    Hypertension Sister    77 / Stillbirths Sister    Breast cancer Paternal Aunt 32   Osteoarthritis Maternal Grandmother    Asthma Maternal Grandmother    Cancer Maternal Grandmother    COPD Maternal Grandmother    Depression  Maternal Grandmother    Early death Maternal Grandmother    Osteoarthritis Maternal Grandfather    Asthma Maternal Grandfather    COPD Maternal Grandfather    Hypertension Maternal Grandfather    Hyperlipidemia Maternal Grandfather    Cancer Paternal Grandmother    Diabetes Paternal Grandmother    Diabetes Paternal Grandfather    Hyperlipidemia Paternal Grandfather    Heart failure Paternal Grandfather    Kidney disease Paternal Grandfather  Alcohol abuse Brother    Hypertension Brother    Hyperlipidemia Other    Heart failure Other    Kidney disease Other     Social History Social History   Tobacco Use   Smoking status: Never   Smokeless tobacco: Never  Vaping Use   Vaping Use: Never used  Substance Use Topics   Alcohol use: Yes    Comment: 1 drink every two weeks    Drug use: Never     Allergies   Clarithromycin   Review of Systems Review of Systems  Respiratory:  Positive for cough and wheezing.      Physical Exam Triage Vital Signs ED Triage Vitals  Enc Vitals Group     BP 11/23/22 0934 134/88     Pulse Rate 11/23/22 0934 81     Resp 11/23/22 0934 18     Temp 11/23/22 0934 98.7 F (37.1 C)     Temp Source 11/23/22 0934 Oral     SpO2 11/23/22 0934 97 %     Weight --      Height --      Head Circumference --      Peak Flow --      Pain Score 11/23/22 0936 0     Pain Loc --      Pain Edu? --      Excl. in [place]? --    No data found.  Updated Vital Signs BP 134/88 (BP Location: Right Arm)   Pulse 81   Temp 98.7 F (37.1 C) (Oral)   Resp 18   LMP 10/04/2022   SpO2 97%   Visual Acuity Right Eye Distance:   Left Eye Distance:   Bilateral Distance:    Right Eye Near:   Left Eye Near:    Bilateral Near:     Physical Exam Vitals reviewed.  Constitutional:      Appearance: She is ill-appearing.  Cardiovascular:     Rate and Rhythm: Normal rate and regular rhythm.  Pulmonary:     Effort: Pulmonary effort is normal.     Breath sounds:  Wheezing and rhonchi present.  Skin:    General: Skin is warm and dry.  Neurological:     General: No focal deficit present.     Mental Status: She is alert and oriented to person, place, and time.  Psychiatric:        Mood and Affect: Mood normal.        Behavior: Behavior normal.      UC Treatments / Results  Labs (all labs ordered are listed, but only abnormal results are displayed) Labs Reviewed - No data to display  EKG   Radiology No results found.  Procedures Procedures (including critical care time)  Medications Ordered in UC Medications - No data to display  Initial Impression / Assessment and Plan / UC Course  I have reviewed the triage vital signs and the nursing notes.  Pertinent labs & imaging results that were available during my care of the patient were reviewed by me and considered in my medical decision making (see chart for details).   Patient is afebrile here without recent antipyretics. Satting well on room air. Overall is ill appearing, well hydrated, without respiratory distress. Pulmonary exam is remarkable for rhonchorous lung sounds in all lobes with wheezing.  Attempts at deep breath trigger fits of severe cough.  Concern for lower respiratory infection such as CAP versus acute exacerbation of asthma.  Chest x-ray indicates no acute  cardiopulmonary process.  Will prescribe longer course of prednisone to treat presumed asthma exacerbation.  Given her history of DM 2 with hyperglycemia, warned patient that this would exacerbate her symptoms of high blood sugars and asked her to watch diet over the course of the next 2 weeks.  Final Clinical Impressions(s) / UC Diagnoses   Final diagnoses:  None   Discharge Instructions   None    ED Prescriptions   None    PDMP not reviewed this encounter.   Rose Phi, [place] 11/23/22 1047

## 2022-12-14 ENCOUNTER — Other Ambulatory Visit: Payer: Self-pay | Admitting: Primary Care

## 2022-12-14 DIAGNOSIS — J4520 Mild intermittent asthma, uncomplicated: Secondary | ICD-10-CM

## 2022-12-14 DIAGNOSIS — E119 Type 2 diabetes mellitus without complications: Secondary | ICD-10-CM

## 2022-12-14 DIAGNOSIS — E1165 Type 2 diabetes mellitus with hyperglycemia: Secondary | ICD-10-CM

## 2022-12-15 MED ORDER — METFORMIN HCL 1000 MG PO TABS: 1000.0000 mg | ORAL_TABLET | Freq: Two times a day (BID) | ORAL | 0 refills | Status: DC

## 2022-12-15 MED ORDER — ALBUTEROL SULFATE HFA 108 (90 BASE) MCG/ACT IN AERS: 1.0000 | INHALATION_SPRAY | Freq: Four times a day (QID) | RESPIRATORY_TRACT | 0 refills | Status: DC | PRN

## 2022-12-25 ENCOUNTER — Telehealth: Payer: 59 | Admitting: Physician Assistant

## 2022-12-25 DIAGNOSIS — J069 Acute upper respiratory infection, unspecified: Secondary | ICD-10-CM

## 2022-12-25 DIAGNOSIS — J029 Acute pharyngitis, unspecified: Secondary | ICD-10-CM

## 2022-12-25 NOTE — Progress Notes (Signed)
Thank you for submitting your e-visit. We are sorry that you are not feeling well! On review of your submission, you have either listed that you are in a state outside our coverage area or have listed a pharmacy outside that area. Our providers are licensed in [place] and [place], and cannot treat or send a medication to a patient outside that coverage area due to telehealth laws.   In order to make sure you are taken care of today, there are a few options:  (1) Contact your PCP to see if they can see you via video and send the medication in for you (2) You can click HERE to be taken to our [place] site to be paired with a provider licensed in your area. When signing up for a visit, you need to select the state you are currently in so you will be able to select the proper providers (3) Be seen locally at an urgent care facility.   Thank you again for choosing [facility] for your care needs. We hope you feel better soon!

## 2022-12-25 NOTE — Progress Notes (Signed)
Patinet submitted EV earlier out of state. Informed we could not treat outside [place]. Now submitting another e-visit with an [place] pharmacy despite still being in [place]. Discussed again with her, what steps she needs to take to be seen.

## 2023-01-09 ENCOUNTER — Other Ambulatory Visit: Payer: Self-pay | Admitting: Primary Care

## 2023-01-09 DIAGNOSIS — F411 Generalized anxiety disorder: Secondary | ICD-10-CM

## 2023-01-09 MED ORDER — HYDROXYZINE HCL 10 MG PO TABS: 10.0000 mg | ORAL_TABLET | Freq: Two times a day (BID) | ORAL | 0 refills | Status: DC | PRN

## 2023-01-09 NOTE — Telephone Encounter (Signed)
From: Olevia Perches To: Office of Pleas Koch, NP Sent: 01/08/2023 4:52 PM EST Subject: Medication Renewal Request  Refills have been requested for the following medications:   hydrOXYzine (ATARAX) 10 MG tablet [[place] [facility]]  Patient Comment: Im in need if more as my brother in law is in critical care icu going on 22 days and my father in law was just diagnosed wirh stage 4colon cancer   Preferred pharmacy: [id number]- [place], [place] - [address] Delivery method: [employer]

## 2023-01-12 ENCOUNTER — Telehealth: Payer: 59 | Admitting: Physician Assistant

## 2023-01-12 DIAGNOSIS — J020 Streptococcal pharyngitis: Secondary | ICD-10-CM

## 2023-01-12 MED ORDER — AMOXICILLIN 500 MG PO CAPS: 500.0000 mg | ORAL_CAPSULE | Freq: Two times a day (BID) | ORAL | 0 refills | Status: AC

## 2023-01-12 NOTE — Progress Notes (Signed)

## 2023-01-13 ENCOUNTER — Other Ambulatory Visit: Payer: Self-pay | Admitting: Primary Care

## 2023-01-13 DIAGNOSIS — J4520 Mild intermittent asthma, uncomplicated: Secondary | ICD-10-CM

## 2023-01-17 ENCOUNTER — Other Ambulatory Visit: Payer: Self-pay | Admitting: Primary Care

## 2023-01-17 DIAGNOSIS — F411 Generalized anxiety disorder: Secondary | ICD-10-CM

## 2023-02-05 ENCOUNTER — Other Ambulatory Visit: Payer: Self-pay | Admitting: Primary Care

## 2023-02-05 DIAGNOSIS — J4520 Mild intermittent asthma, uncomplicated: Secondary | ICD-10-CM

## 2023-02-23 ENCOUNTER — Telehealth: Payer: 59 | Admitting: Physician Assistant

## 2023-02-23 DIAGNOSIS — A084 Viral intestinal infection, unspecified: Secondary | ICD-10-CM

## 2023-02-23 MED ORDER — ONDANSETRON 4 MG PO TBDP: 4.0000 mg | ORAL_TABLET | Freq: Three times a day (TID) | ORAL | 0 refills | Status: DC | PRN

## 2023-02-23 NOTE — Progress Notes (Signed)
E-Visit for Nausea and Vomiting   We are sorry that you are not feeling well. Here is how we plan to help!  Based on what you have shared with me it looks like you have a Virus that is irritating your GI tract.  Vomiting is the forceful emptying of a portion of the stomach's content through the mouth.  Although nausea and vomiting can make you feel miserable, it's important to remember that these are not diseases, but rather symptoms of an underlying illness.  When we treat short term symptoms, we always caution that any symptoms that persist should be fully evaluated in a medical office.  I have prescribed a medication that will help alleviate your symptoms and allow you to stay hydrated:  Zofran 4 mg 1 tablet every 8 hours as needed for nausea and vomiting  HOME CARE: Drink clear liquids.  This is very important! Dehydration (the lack of fluid) can lead to a serious complication.  Start off with 1 tablespoon every 5 minutes for 8 hours. You may begin eating bland foods after 8 hours without vomiting.  Start with saltine crackers, white bread, rice, mashed potatoes, applesauce. After 48 hours on a bland diet, you may resume a normal diet. Try to go to sleep.  Sleep often empties the stomach and relieves the need to vomit.  GET HELP RIGHT AWAY IF:  Your symptoms do not improve or worsen within 2 days after treatment. You have a fever for over 3 days. You cannot keep down fluids after trying the medication.  MAKE SURE YOU:  Understand these instructions. Will watch your condition. Will get help right away if you are not doing well or get worse.    Thank you for choosing an e-visit.  Your e-visit answers were reviewed by a board certified advanced clinical practitioner to complete your personal care plan. Depending upon the condition, your plan could have included both over the counter or prescription medications.  Please review your pharmacy choice. Make sure the pharmacy is open so  you can pick up prescription now. If there is a problem, you may contact your provider through MyChart messaging and have the prescription routed to another pharmacy.  Your safety is important to us. If you have drug allergies check your prescription carefully.   For the next 24 hours you can use MyChart to ask questions about today's visit, request a non-urgent call back, or ask for a work or school excuse. You will get an email in the next two days asking about your experience. I hope that your e-visit has been valuable and will speed your recovery.  I have spent 5 minutes in review of e-visit questionnaire, review and updating patient chart, medical decision making and response to patient.   Xerxes Agrusa M Cordie Buening, PA-C  

## 2023-02-25 ENCOUNTER — Telehealth (INDEPENDENT_AMBULATORY_CARE_PROVIDER_SITE_OTHER): Payer: 59 | Admitting: Primary Care

## 2023-02-25 ENCOUNTER — Encounter: Payer: Self-pay | Admitting: Primary Care

## 2023-02-25 VITALS — Ht 65.0 in | Wt 265.0 lb

## 2023-02-25 DIAGNOSIS — F411 Generalized anxiety disorder: Secondary | ICD-10-CM | POA: Diagnosis not present

## 2023-02-25 MED ORDER — TRAZODONE HCL 50 MG PO TABS: 25.0000 mg | ORAL_TABLET | Freq: Every evening | ORAL | 0 refills | Status: DC | PRN

## 2023-02-25 MED ORDER — ALPRAZOLAM 0.5 MG PO TABS: 0.2500 mg | ORAL_TABLET | Freq: Every day | ORAL | 0 refills | Status: DC | PRN

## 2023-02-25 NOTE — Progress Notes (Signed)
Patient ID: Kelli Cox, female    DOB: 1976-04-19, 47 y.o.   MRN: [id number]  Virtual visit completed through [place], a video enabled telemedicine application. Due to national recommendations of social distancing due to COVID-19, a virtual visit is felt to be most appropriate for this patient at this time. Reviewed limitations, risks, security and privacy concerns of performing a virtual visit and the availability of in person appointments. I also reviewed that there may be a patient responsible charge related to this service. The patient agreed to proceed.   Patient location: home Provider location: [place] [facility], office Persons participating in this virtual visit: patient, provider   If any vitals were documented, they were collected by patient at home unless specified below.    Ht 5\' 5"  (1.651 m)   Wt 265 lb (120.2 kg)   LMP 02/15/2023 (Exact Date)   BMI 44.10 kg/m    CC: Anxiety Subjective:   HPI: Kelli Cox is a 47 y.o. female with a history of asthma, type 2 diabetes, migraines, GAD presenting on 02/25/2023 for Medical Management of Chronic Issues (Anxiety f/u/)  Currently managed on sertraline 50 mg daily and hydroxyzine 10 mg PRN for anxiety. Over the last 2 months she's been under a tremendous stress as her brother as been in the ICU in [place]. Her brother recently passed away unrepentantly in the hospital and she witnessed his death. Also she's has had multiple family members with diagnosed with cancer or not responding to current cancer treatment.   Symptoms include sleep disturbance, mind racing thoughts, worrying about everyone in her family, difficulty relaxing, abdominal cramping. She's been taking hydroxyzine at multiple doses without improvement. Historically, Zoloft helped but not now. She took Xanax 0.25 mg and 0.5 mg (twice total) during her visit in [place] and this helped with panic attacks. She is also requesting something to help with sleep. She  was on Trazodone in the past which helped.      Relevant past medical, surgical, family and social history reviewed and updated as indicated. Interim medical history since our last visit reviewed. Allergies and medications reviewed and updated. Outpatient Medications Prior to Visit  Medication Sig Dispense Refill   albuterol (VENTOLIN HFA) 108 (90 Base) MCG/ACT inhaler Inhale 1-2 puffs into the lungs every 6 (six) hours as needed for wheezing or shortness of breath. 1 each 0   butalbital-aspirin-caffeine (FIORINAL) 50-325-40 MG capsule Take 1 capsule by mouth every 6 (six) hours as needed for headache. For headache/migraines 20 capsule 0   cetirizine (ZYRTEC) 10 MG tablet Take 1 tablet (10 mg total) by mouth daily. 30 tablet 11   Continuous Blood Gluc Sensor (FREESTYLE LIBRE 3 SENSOR) MISC USE CONTINUOUSLY TO CHECK GLUCOSE READINGS, CHANGE SENSOR EVERY 2 WEEKS. 6 each 1   glipiZIDE (GLUCOTROL) 5 MG tablet TAKE 1 TABLET (5 MG TOTAL) BY MOUTH 2 (TWO) TIMES DAILY BEFORE A MEAL. FOR DIABETES. 180 tablet 0   hydrOXYzine (ATARAX) 10 MG tablet Take 1-2 tablets (10-20 mg total) by mouth 2 (two) times daily as needed for anxiety. 60 tablet 0   letrozole (FEMARA) 2.5 MG tablet Take 2.5 mg by mouth daily.     metFORMIN (GLUCOPHAGE) 1000 MG tablet Take 1 tablet (1,000 mg total) by mouth 2 (two) times daily with a meal. For diabetes. 180 tablet 0   Multiple Vitamins-Minerals (MULTIVITAMIN WITH MINERALS) tablet Take 1 tablet by mouth daily.     norethindrone (AYGESTIN) 5 MG tablet Take 2.5 mg  by mouth 2 (two) times daily.     sertraline (ZOLOFT) 50 MG tablet TAKE 1 TABLET (50 MG TOTAL) BY MOUTH DAILY. FOR ANXIETY. 90 tablet 2   vitamin E 180 MG (400 UNITS) capsule Take 400 Units by mouth daily.     WIXELA INHUB 100-50 MCG/ACT AEPB INHALE 1 PUFF INTO THE LUNGS TWICE A DAY 60 each 2   diclofenac (VOLTAREN) 75 MG EC tablet Take 1 tablet (75 mg total) by mouth 2 (two) times daily. (Patient not taking: Reported on  11/18/2022) 30 tablet 0   fluticasone (FLONASE) 50 MCG/ACT nasal spray Place 2 sprays into both nostrils daily. 16 g 0   gabapentin (NEURONTIN) 100 MG capsule Take 1 capsule (100 mg total) by mouth 2 (two) times daily. (Patient not taking: Reported on 11/18/2022) 60 capsule 0   ondansetron (ZOFRAN-ODT) 4 MG disintegrating tablet Take 1 tablet (4 mg total) by mouth every 8 (eight) hours as needed. 20 tablet 0   promethazine (PHENERGAN) 25 MG tablet Take 1 tablet (25 mg total) by mouth every 6 (six) hours as needed for nausea or vomiting. (Patient not taking: Reported on 10/02/2022) 30 tablet 6   No facility-administered medications prior to visit.     Per HPI unless specifically indicated in ROS section below Review of Systems  Constitutional:  Positive for fatigue.  Cardiovascular:  Positive for palpitations.  Gastrointestinal:  Positive for abdominal pain.  Psychiatric/Behavioral:  Positive for sleep disturbance. The patient is nervous/anxious.    Objective:  Ht 5\' 5"  (1.651 m)   Wt 265 lb (120.2 kg)   LMP 02/15/2023 (Exact Date)   BMI 44.10 kg/m   Wt Readings from Last 3 Encounters:  02/25/23 265 lb (120.2 kg)  11/18/22 270 lb 4 oz (122.6 kg)  10/31/22 272 lb 3.2 oz (123.5 kg)       Physical exam: General: Alert and oriented x 3, no distress, does not appear sickly  Pulmonary: Speaks in complete sentences without increased work of breathing, no cough during visit.  Psychiatric: Normal mood, thought content, and behavior.     Results for orders placed or performed in visit on 11/18/22  POCT Influenza A/B  Result Value Ref Range   Influenza A, POC Positive (A) Negative   Influenza B, POC Negative Negative   Assessment & Plan:   Problem List Items Addressed This Visit       Other   GAD (generalized anxiety disorder) - Primary    Uncontrolled given recent circumstances.  Condolences provided.  Increase sertraline to 75 mg for now, may increase to 100 mg if needed.  She has a new bottle of 50 mg at home and will update when needing refills.  Start Trazodone 25-50 mg HS PRN. Agree to provide one time supply of Xanax 0.5 mg to use sparingly.   Referral placed to therapy.       Relevant Medications   ALPRAZolam (XANAX) 0.5 MG tablet   traZODone (DESYREL) 50 MG tablet   Other Relevant Orders   Ambulatory referral to Psychology     Meds ordered this encounter  Medications   ALPRAZolam (XANAX) 0.5 MG tablet    Sig: Take 0.5-1 tablets (0.25-0.5 mg total) by mouth daily as needed for anxiety.    Dispense:  20 tablet    Refill:  0    Order Specific Question:   Supervising Provider    Answer:   BEDSOLE, AMY E [[id number]]   traZODone (DESYREL) 50 MG tablet    Sig:  Take 0.5-1 tablets (25-50 mg total) by mouth at bedtime as needed for sleep.    Dispense:  30 tablet    Refill:  0    Order Specific Question:   Supervising Provider    Answer:   Diona Browner, AMY E [id number]   Orders Placed This Encounter  Procedures   Ambulatory referral to Psychology    Referral Priority:   Routine    Referral Type:   Psychiatric    Referral Reason:   Specialty Services Required    Requested Specialty:   Psychology    Number of Visits Requested:   1    I discussed the assessment and treatment plan with the patient. The patient was provided an opportunity to ask questions and all were answered. The patient agreed with the plan and demonstrated an understanding of the instructions. The patient was advised to call back or seek an in-person evaluation if the symptoms worsen or if the condition fails to improve as anticipated.  Follow up plan:  We increased your Zoloft to 75 mg daily. Okay to increase to 100 mg if needed, but wait for 1-2 weeks.  Use the Xanax sparingly as discussed.   Try Trazodone 25-50 mg at bedtime for sleep.   You will either be contacted via phone regarding your referral to therapy, or you may receive a letter on your MyChart portal from our referral team  with instructions for scheduling an appointment. Please let us know if you have not been contacted by anyone within two weeks.  It was a pleasure to see you today!   Pleas Koch, NP

## 2023-02-25 NOTE — Assessment & Plan Note (Signed)
Uncontrolled given recent circumstances.  Condolences provided.  Increase sertraline to 75 mg for now, may increase to 100 mg if needed. She has a new bottle of 50 mg at home and will update when needing refills.  Start Trazodone 25-50 mg HS PRN. Agree to provide one time supply of Xanax 0.5 mg to use sparingly.   Referral placed to therapy.

## 2023-02-25 NOTE — Patient Instructions (Signed)
We increased your Zoloft to 75 mg daily. Okay to increase to 100 mg if needed, but wait for 1-2 weeks.  Use the Xanax sparingly as discussed.   Try Trazodone 25-50 mg at bedtime for sleep.   You will either be contacted via phone regarding your referral to therapy, or you may receive a letter on your MyChart portal from our referral team with instructions for scheduling an appointment. Please let us know if you have not been contacted by anyone within two weeks.  It was a pleasure to see you today!

## 2023-03-03 ENCOUNTER — Other Ambulatory Visit: Payer: Self-pay | Admitting: Primary Care

## 2023-03-03 DIAGNOSIS — F411 Generalized anxiety disorder: Secondary | ICD-10-CM

## 2023-03-13 DIAGNOSIS — F411 Generalized anxiety disorder: Secondary | ICD-10-CM

## 2023-03-13 MED ORDER — SERTRALINE HCL 50 MG PO TABS: 75.0000 mg | ORAL_TABLET | Freq: Every day | ORAL | 0 refills | Status: DC

## 2023-03-16 DIAGNOSIS — E1165 Type 2 diabetes mellitus with hyperglycemia: Secondary | ICD-10-CM

## 2023-03-16 MED ORDER — FREESTYLE LIBRE 3 SENSOR MISC: 1 refills | Status: DC

## 2023-03-19 ENCOUNTER — Other Ambulatory Visit: Payer: Self-pay | Admitting: Primary Care

## 2023-03-19 DIAGNOSIS — F411 Generalized anxiety disorder: Secondary | ICD-10-CM

## 2023-03-23 ENCOUNTER — Other Ambulatory Visit: Payer: Self-pay | Admitting: Primary Care

## 2023-03-23 DIAGNOSIS — E1165 Type 2 diabetes mellitus with hyperglycemia: Secondary | ICD-10-CM

## 2023-03-25 ENCOUNTER — Ambulatory Visit: Payer: 59 | Admitting: Primary Care

## 2023-03-25 ENCOUNTER — Telehealth: Payer: 59 | Admitting: Physician Assistant

## 2023-03-25 DIAGNOSIS — B3731 Acute candidiasis of vulva and vagina: Secondary | ICD-10-CM

## 2023-03-25 MED ORDER — FLUCONAZOLE 150 MG PO TABS: 150.0000 mg | ORAL_TABLET | ORAL | 0 refills | Status: DC | PRN

## 2023-03-25 NOTE — Progress Notes (Signed)

## 2023-03-26 ENCOUNTER — Other Ambulatory Visit: Payer: Self-pay | Admitting: Primary Care

## 2023-03-26 DIAGNOSIS — Z1231 Encounter for screening mammogram for malignant neoplasm of breast: Secondary | ICD-10-CM

## 2023-04-02 ENCOUNTER — Ambulatory Visit: Payer: 59 | Admitting: Primary Care

## 2023-04-10 ENCOUNTER — Ambulatory Visit
Admission: RE | Admit: 2023-04-10 | Discharge: 2023-04-10 | Disposition: A | Discharge status: Home / Self Care | Payer: 59 | Source: Ambulatory Visit

## 2023-04-10 ENCOUNTER — Ambulatory Visit (INDEPENDENT_AMBULATORY_CARE_PROVIDER_SITE_OTHER): Payer: 59 | Admitting: Clinical

## 2023-04-10 DIAGNOSIS — Z1231 Encounter for screening mammogram for malignant neoplasm of breast: Secondary | ICD-10-CM

## 2023-04-10 DIAGNOSIS — F411 Generalized anxiety disorder: Secondary | ICD-10-CM

## 2023-04-10 NOTE — Progress Notes (Signed)
                Aaditya Letizia, LCSW 
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
## 2023-04-13 ENCOUNTER — Ambulatory Visit (INDEPENDENT_AMBULATORY_CARE_PROVIDER_SITE_OTHER): Payer: 59 | Admitting: Clinical

## 2023-04-13 DIAGNOSIS — F411 Generalized anxiety disorder: Secondary | ICD-10-CM | POA: Diagnosis not present

## 2023-04-13 NOTE — Progress Notes (Signed)
[facility] Counselor/Therapist Progress Note  Patient ID: Kelli Cox, MRN: [id number],    Date: 04/13/2023  Time Spent: 2:35pm - 3:10pm : 35 minutes   Treatment Type: Individual Therapy  Reported Symptoms:  Patient reported improvement in falling asleep and staying asleep during the past two nights.   Mental Status Exam: Appearance:  Well Groomed     Behavior: Appropriate  Motor: Normal  Speech/Language:  Clear and Coherent  Affect: Appropriate  Mood: normal  Thought process: normal  Thought content:   WNL  Sensory/Perceptual disturbances:   WNL  Orientation: oriented to person, place, and situation  Attention: Good  Concentration: Good  Memory: WNL  Fund of knowledge:  Good  Insight:   Good  Judgment:  Good  Impulse Control: Good   Risk Assessment: Danger to Self:  No Patient denied current suicidal ideation  Self-injurious Behavior: No Danger to Others: No Patient denied current homicidal ideation Duty to Warn:no Physical Aggression / Violence:No  Access to Firearms a concern: No  Gang Involvement:No   Subjective: Patient stated, "I feel like I was starting to improve a little last week" and stated, "I feel like I feel better today".  Patient stated, "I'm doing pretty good today" in response to current mood. Patient reported recent improvement in sleep during the past two nights. Patient stated, "I'm good with therapy" in response to recommendation for therapy. Patient stated, "its definitely something I would look into" in response to a consultation with a psychiatrist.  Patient reported she has not pursued participation in a grief/loss support group due to her sister's experience attending a group. Patient stated, "I will look into it but I don't know if it is something I will pursue" in response participation in a grief support group. Patient stated, "methods to help calm me down when I'm feeling anxiety", strategies to prevent panic attacks, and  decreased frustration and anxiety in response to potential goals for therapy.   Interventions: Motivational Interviewing. Clinician reviewed diagnosis and treatment recommendations. Provided psycho education related to diagnosis and treatment. Clinician utilized motivational interviewing to explore potential goals for therapy. Clinician utilized a task centered approach in collaboration with patient to develop goals for therapy. Patient participated in development of goals and agreed to goals for therapy.   Collaboration of Care: Psychiatrist AEB patient agreed to a consent for [facility]  Patient/Guardian was advised Release of Information must be obtained prior to any record release in order to collaborate their care with an outside provider. Patient/Guardian was advised if they have not already done so to contact [facility] to sign all necessary forms in order for [place] to release information regarding their care.   Consent: Patient/Guardian gives verbal consent for treatment and assignment of benefits for services provided during this visit. Patient/Guardian expressed understanding and agreed to proceed.   Diagnosis:Generalized anxiety disorder  Plan: Patient is to utilize [employer] [facility], thought re-framing, relaxation techniques, and coping strategies to decrease symptoms associated with their diagnosis. Frequency: bi-weekly  Modality: individual     Long-term goal:   Reduce overall level, frequency, and intensity of the feelings of anxiety and panic as evidenced by decrease in hyperventilating, crying, tremors, wanting to be left alone, difficulty falling asleep and staying asleep, worry, restlessness, decreased concentration, frustration, and loss of interest from 4 to 5 days/week to 0 to 1 days/week per patient report for at least 3 consecutive months. Target Date: 04/12/24  Progress: 0   Short-term goal:  Begin healthy grieving  process Target Date: 10/14/23  Progress: 0   Develop and implement coping strategies for patient to utilize to decrease frustration, symptoms of anxiety, and develop effective ways to manage stressors Target Date: 10/14/23  Progress: 0   Identify, challenge, and replace cognitive distortions/negative thoughts patterns that contribute to feelings of anxiety and panic with positive thoughts and beliefs per patient's report Target Date: 10/14/23  Progress: 0

## 2023-04-13 NOTE — Progress Notes (Signed)
                Kaio Kuhlman, LCSW 

## 2023-04-21 ENCOUNTER — Ambulatory Visit: Payer: 59 | Admitting: Primary Care

## 2023-04-21 ENCOUNTER — Encounter: Payer: Self-pay | Admitting: Primary Care

## 2023-04-21 VITALS — BP 122/80 | HR 72 | Temp 98.2°F | Ht 65.0 in | Wt 267.0 lb

## 2023-04-21 DIAGNOSIS — G43009 Migraine without aura, not intractable, without status migrainosus: Secondary | ICD-10-CM

## 2023-04-21 DIAGNOSIS — J4520 Mild intermittent asthma, uncomplicated: Secondary | ICD-10-CM

## 2023-04-21 DIAGNOSIS — Z7984 Long term (current) use of oral hypoglycemic drugs: Secondary | ICD-10-CM | POA: Diagnosis not present

## 2023-04-21 DIAGNOSIS — Z0000 Encounter for general adult medical examination without abnormal findings: Secondary | ICD-10-CM

## 2023-04-21 DIAGNOSIS — F411 Generalized anxiety disorder: Secondary | ICD-10-CM

## 2023-04-21 DIAGNOSIS — E1165 Type 2 diabetes mellitus with hyperglycemia: Secondary | ICD-10-CM | POA: Diagnosis not present

## 2023-04-21 DIAGNOSIS — N979 Female infertility, unspecified: Secondary | ICD-10-CM

## 2023-04-21 LAB — POCT GLYCOSYLATED HEMOGLOBIN (HGB A1C): Hemoglobin A1C: 6.9 % — AB (ref 4.0–5.6)

## 2023-04-21 NOTE — Patient Instructions (Addendum)
Please send me lab results once received.  Please schedule a follow up visit for 3 months.  It was a pleasure to see you today!

## 2023-04-21 NOTE — Assessment & Plan Note (Signed)
Deteriorated with A1C of 6.9 today. Goal is 6.5 or less.  She will work on diet, continue with regular exercise. Continue metformin 1000 mg BID and glipizide 5 mg BID.  Follow up in 3 months.

## 2023-04-21 NOTE — Assessment & Plan Note (Addendum)
Following with fertility specialists. She will have lab work forwarded to [place].  Continue to work on lower A1C which was 6.9 today.  Continue Femara 2.5 mg daily and norethindrone 2.5 mg BID.

## 2023-04-21 NOTE — Progress Notes (Signed)
Subjective:    Patient ID: Kelli Cox, female    DOB: Oct 15, 1976, 47 y.o.   MRN: [id number]  HPI  Kelli Cox is a very pleasant 47 y.o. female with a history of type 2 diabetes, migraines, asthma, who presents today for complete physical and follow up of chronic conditions.  Immunizations: -Tetanus: Completed in 2022 -Pneumonia: Completed 2018  Diet: Fair diet.  Exercise: 3-5 days weekly on average  Eye exam: Completes annually  Dental exam: Completed > 1 year ago  Pap Smear: 2022 Mammogram: May 2024  BP Readings from Last 3 Encounters:  04/21/23 122/80  11/23/22 134/88  11/18/22 128/82      Review of Systems  Constitutional:  Negative for unexpected weight change.  HENT:  Negative for rhinorrhea.   Respiratory:  Negative for cough and shortness of breath.   Cardiovascular:  Negative for chest pain.  Gastrointestinal:  Negative for constipation and diarrhea.  Genitourinary:  Negative for difficulty urinating.  Musculoskeletal:  Negative for arthralgias and myalgias.  Skin:  Negative for rash.  Allergic/Immunologic: Negative for environmental allergies.  Neurological:  Positive for numbness. Negative for dizziness and headaches.  Psychiatric/Behavioral:  The patient is not nervous/anxious.          Past Medical History:  Diagnosis Date   Acute non-recurrent sinusitis 10/17/2020   Asthma    Chicken pox    Concussion with no loss of consciousness 08/27/2021   GAD (generalized anxiety disorder)    Leg pain, lateral, left 07/31/2022   Migraines    Mild intermittent asthma with (acute) exacerbation 10/31/2022   Pain of upper abdomen 01/23/2021   Shortness of breath 11/18/2022   Type 2 diabetes mellitus (HCC)     Social History   Socioeconomic History   Marital status: Single    Spouse name: Not on file   Number of children: Not on file   Years of education: Not on file   Highest education level: Master's degree (e.g., MA, MS, MEng, MEd, MSW, MBA)   Occupational History   Occupation: Emergency planning/management officer  Tobacco Use   Smoking status: Never   Smokeless tobacco: Never  Vaping Use   Vaping Use: Never used  Substance and Sexual Activity   Alcohol use: Yes    Comment: 1 drink every two weeks    Drug use: Never   Sexual activity: Yes    Partners: Male    Birth control/protection: None  Other Topics Concern   Not on file  Social History Narrative   Not on file   Social Determinants of Health   Financial Resource Strain: Low Risk  (04/20/2023)   Overall Financial Resource Strain (CARDIA)    Difficulty of Paying Living Expenses: Not hard at all  Food Insecurity: No Food Insecurity (04/20/2023)   Hunger Vital Sign    Worried About Running Out of Food in the Last Year: Never true    Ran Out of Food in the Last Year: Never true  Transportation Needs: No Transportation Needs (04/20/2023)   PRAPARE - Administrator, Civil Service (Medical): No    Lack of Transportation (Non-Medical): No  Physical Activity: Insufficiently Active (04/20/2023)   Exercise Vital Sign    Days of Exercise per Week: 3 days    Minutes of Exercise per Session: 30 min  Stress: Stress Concern Present (04/20/2023)   [employer] of Occupational Health - Occupational Stress Questionnaire    Feeling of Stress : To some extent  Social Connections: Moderately  Isolated (04/20/2023)   Social Connection and Isolation Panel [NHANES]    Frequency of Communication with Friends and Family: More than three times a week    Frequency of Social Gatherings with Friends and Family: Once a week    Attends Religious Services: Never    Database administrator or Organizations: No    Attends Engineer, structural: Not on file    Marital Status: Living with partner  Intimate Partner Violence: Not on file    Past Surgical History:  Procedure Laterality Date   CHOLECYSTECTOMY  2008   COLONOSCOPY WITH PROPOFOL N/A 07/05/2021   Procedure: COLONOSCOPY WITH  PROPOFOL;  Surgeon: Wyline Mood, MD;  Location: [facility] ENDOSCOPY;  Service: Gastroenterology;  Laterality: N/A;   LIPOMA RESECTION  2012   WRIST SURGERY Left    2001 and 2007    Family History  Problem Relation Age of Onset   Arthritis Mother    Asthma Mother    Depression Mother    Hyperlipidemia Mother    Hypertension Mother    Kidney disease Mother    Cancer Father    Diabetes Father    Early death Father    Hyperlipidemia Father    Hypertension Father    Heart failure Father    Asthma Sister    Hypertension Sister    Miscarriages / Stillbirths Sister    Breast cancer Paternal Aunt 36   Osteoarthritis Maternal Grandmother    Asthma Maternal Grandmother    Cancer Maternal Grandmother    COPD Maternal Grandmother    Depression Maternal Grandmother    Early death Maternal Grandmother    Osteoarthritis Maternal Grandfather    Asthma Maternal Grandfather    COPD Maternal Grandfather    Hypertension Maternal Grandfather    Hyperlipidemia Maternal Grandfather    Cancer Paternal Grandmother    Diabetes Paternal Grandmother    Diabetes Paternal Grandfather    Hyperlipidemia Paternal Grandfather    Heart failure Paternal Grandfather    Kidney disease Paternal Grandfather    Alcohol abuse Brother    Hypertension Brother    Hyperlipidemia Other    Heart failure Other    Kidney disease Other     Allergies  Allergen Reactions   Clarithromycin Nausea And Vomiting    Current Outpatient Medications on File Prior to Visit  Medication Sig Dispense Refill   albuterol (VENTOLIN HFA) 108 (90 Base) MCG/ACT inhaler Inhale 1-2 puffs into the lungs every 6 (six) hours as needed for wheezing or shortness of breath. 1 each 0   ALPRAZolam (XANAX) 0.5 MG tablet Take 0.5-1 tablets (0.25-0.5 mg total) by mouth daily as needed for anxiety. 20 tablet 0   butalbital-aspirin-caffeine (FIORINAL) 50-325-40 MG capsule Take 1 capsule by mouth every 6 (six) hours as needed for headache. For  headache/migraines 20 capsule 0   cetirizine (ZYRTEC) 10 MG tablet Take 1 tablet (10 mg total) by mouth daily. 30 tablet 11   Continuous Glucose Sensor (FREESTYLE LIBRE 3 SENSOR) MISC Use to check blood sugars continuously. Change every 14 days. 6 each 1   glipiZIDE (GLUCOTROL) 5 MG tablet TAKE 1 TABLET (5 MG TOTAL) BY MOUTH 2 (TWO) TIMES DAILY BEFORE A MEAL. FOR DIABETES. 180 tablet 0   hydrOXYzine (ATARAX) 10 MG tablet Take 1-2 tablets (10-20 mg total) by mouth 2 (two) times daily as needed for anxiety. 60 tablet 0   letrozole (FEMARA) 2.5 MG tablet Take 2.5 mg by mouth daily.     metFORMIN (GLUCOPHAGE) 1000 MG  tablet Take 1 tablet (1,000 mg total) by mouth 2 (two) times daily with a meal. For diabetes. 180 tablet 0   Multiple Vitamins-Minerals (MULTIVITAMIN WITH MINERALS) tablet Take 1 tablet by mouth daily.     norethindrone (AYGESTIN) 5 MG tablet Take 2.5 mg by mouth 2 (two) times daily.     sertraline (ZOLOFT) 50 MG tablet Take 1.5 tablets (75 mg total) by mouth daily. For anxiety. 135 tablet 0   WIXELA INHUB 100-50 MCG/ACT AEPB INHALE 1 PUFF INTO THE LUNGS TWICE A DAY 60 each 2   No current facility-administered medications on file prior to visit.    BP 122/80   Pulse 72   Temp 98.2 F (36.8 C) (Temporal)   Ht 5\' 5"  (1.651 m)   Wt 267 lb (121.1 kg)   LMP 04/14/2023 (Exact Date)   SpO2 96%   BMI 44.43 kg/m  Objective:   Physical Exam HENT:     Right Ear: Tympanic membrane and ear canal normal.     Left Ear: Tympanic membrane and ear canal normal.     Nose: Nose normal.  Eyes:     Conjunctiva/sclera: Conjunctivae normal.     Pupils: Pupils are equal, round, and reactive to light.  Neck:     Thyroid: No thyromegaly.  Cardiovascular:     Rate and Rhythm: Normal rate and regular rhythm.     Heart sounds: No murmur heard. Pulmonary:     Effort: Pulmonary effort is normal.     Breath sounds: Normal breath sounds. No rales.  Abdominal:     General: Bowel sounds are normal.      Palpations: Abdomen is soft.     Tenderness: There is no abdominal tenderness.  Musculoskeletal:        General: Normal range of motion.     Cervical back: Neck supple.  Lymphadenopathy:     Cervical: No cervical adenopathy.  Skin:    General: Skin is warm and dry.     Findings: No rash.  Neurological:     Mental Status: She is alert and oriented to person, place, and time.     Cranial Nerves: No cranial nerve deficit.     Deep Tendon Reflexes: Reflexes are normal and symmetric.  Psychiatric:        Mood and Affect: Mood normal.           Assessment & Plan:  Preventative health care Assessment & Plan: Immunizations UTD. Pap smear UTD. Mammogram UTD  Discussed the importance of a healthy diet and regular exercise in order for weight loss, and to reduce the risk of further co-morbidity.  Exam stable. Labs completed at fertility specialist, she will send to [place].  Follow up in 1 year for repeat physical.    Type 2 diabetes mellitus with hyperglycemia, without long-term current use of insulin (HCC) Assessment & Plan: Deteriorated with A1C of 6.9 today. Goal is 6.5 or less.  She will work on diet, continue with regular exercise. Continue metformin 1000 mg BID and glipizide 5 mg BID.  Follow up in 3 months.   Orders: -     POCT glycosylated hemoglobin (Hb A1C)  Migraine without aura and without status migrainosus, not intractable Assessment & Plan: Overall controlled, mostly menstrual related.   Continue Tylenol or Excedrin PRN and Fiorinal 50-325-40 mg PRN for which she uses sparingly.    Mild intermittent asthma without complication Assessment & Plan: Controlled.  Continue Wixela 100-50 mg BID. Continue albuterol inhaler PRN.  GAD (generalized anxiety disorder) Assessment & Plan: Improved.  She has reduced back down to Zoloft 50 mg daily.  Continue Zoloft 50 mg daily.  Continue hydroxyzine 10 mg PRN and very sparing use of alprazolam 0.25 mg  PRN.   Female infertility Assessment & Plan: Following with fertility specialists. She will have lab work forwarded to [place].  Continue to work on lower A1C which was 6.9 today.  Continue Femara 2.5 mg daily and norethindrone 2.5 mg BID.          Doreene Nest, NP

## 2023-04-21 NOTE — Assessment & Plan Note (Signed)
Improved.  She has reduced back down to Zoloft 50 mg daily.  Continue Zoloft 50 mg daily.  Continue hydroxyzine 10 mg PRN and very sparing use of alprazolam 0.25 mg PRN.

## 2023-04-21 NOTE — Assessment & Plan Note (Signed)
Controlled.  Continue Wixela 100-50 mg BID. Continue albuterol inhaler PRN.

## 2023-04-21 NOTE — Assessment & Plan Note (Signed)
Overall controlled, mostly menstrual related.   Continue Tylenol or Excedrin PRN and Fiorinal 50-325-40 mg PRN for which she uses sparingly.

## 2023-04-21 NOTE — Assessment & Plan Note (Addendum)
Immunizations UTD. Pap smear UTD. Mammogram UTD  Discussed the importance of a healthy diet and regular exercise in order for weight loss, and to reduce the risk of further co-morbidity.  Exam stable. Labs completed at fertility specialist, she will send to [place].  Follow up in 1 year for repeat physical.

## 2023-04-23 ENCOUNTER — Other Ambulatory Visit (INDEPENDENT_AMBULATORY_CARE_PROVIDER_SITE_OTHER): Payer: 59

## 2023-04-23 ENCOUNTER — Telehealth: Payer: 59 | Admitting: Family Medicine

## 2023-04-23 DIAGNOSIS — J069 Acute upper respiratory infection, unspecified: Secondary | ICD-10-CM

## 2023-04-23 DIAGNOSIS — E1165 Type 2 diabetes mellitus with hyperglycemia: Secondary | ICD-10-CM | POA: Diagnosis not present

## 2023-04-23 LAB — COMPREHENSIVE METABOLIC PANEL
ALT: 11 U/L (ref 0–35)
AST: 12 U/L (ref 0–37)
Albumin: 4 g/dL (ref 3.5–5.2)
Alkaline Phosphatase: 49 U/L (ref 39–117)
BUN: 9 mg/dL (ref 6–23)
CO2: 25 mEq/L (ref 19–32)
Calcium: 8.9 mg/dL (ref 8.4–10.5)
Chloride: 105 mEq/L (ref 96–112)
Creatinine, Ser: 0.8 mg/dL (ref 0.40–1.20)
GFR: 88.05 mL/min (ref >60.00)
Glucose, Bld: 150 mg/dL — ABNORMAL HIGH (ref 70–99)
Potassium: 4.3 mEq/L (ref 3.5–5.1)
Sodium: 139 mEq/L (ref 135–145)
Total Bilirubin: 0.4 mg/dL (ref 0.2–1.2)
Total Protein: 6.4 g/dL (ref 6.0–8.3)

## 2023-04-23 LAB — CBC
HCT: 42.7 % (ref 36.0–46.0)
Hemoglobin: 14 g/dL (ref 12.0–15.0)
MCHC: 32.7 g/dL (ref 30.0–36.0)
MCV: 83.7 fl (ref 78.0–100.0)
Platelets: 265 10*3/uL (ref 150.0–400.0)
RBC: 5.11 Mil/uL (ref 3.87–5.11)
RDW: 14.7 % (ref 11.5–15.5)
WBC: 8.4 10*3/uL (ref 4.0–10.5)

## 2023-04-23 LAB — LIPID PANEL
Cholesterol: 142 mg/dL (ref 0–200)
HDL: 34.6 mg/dL — ABNORMAL LOW (ref >39.00)
LDL Cholesterol: 89 mg/dL (ref 0–99)
NonHDL: 106.92
Total CHOL/HDL Ratio: 4
Triglycerides: 88 mg/dL (ref 0.0–149.0)
VLDL: 17.6 mg/dL (ref 0.0–40.0)

## 2023-04-23 LAB — TSH: TSH: 3.15 u[IU]/mL (ref 0.35–5.50)

## 2023-04-23 MED ORDER — IPRATROPIUM BROMIDE 0.03 % NA SOLN: 2.0000 | Freq: Two times a day (BID) | NASAL | 0 refills | Status: DC

## 2023-04-23 MED ORDER — BENZONATATE 100 MG PO CAPS: 100.0000 mg | ORAL_CAPSULE | Freq: Three times a day (TID) | ORAL | 0 refills | Status: DC | PRN

## 2023-04-23 NOTE — Progress Notes (Signed)
° °E-Visit for Upper Respiratory Infection  ° °We are sorry you are not feeling well.  Here is how we plan to help! ° °Based on what you have shared with me, it looks like you may have a viral upper respiratory infection.  Upper respiratory infections are caused by a large number of viruses; however, rhinovirus is the most common cause.  ° °Symptoms vary from person to person, with common symptoms including sore throat, cough, fatigue or lack of energy and feeling of general discomfort.  A low-grade fever of up to 100.4 may present, but is often uncommon.  Symptoms vary however, and are closely related to a person's age or underlying illnesses.  The most common symptoms associated with an upper respiratory infection are nasal discharge or congestion, cough, sneezing, headache and pressure in the ears and face.  These symptoms usually persist for about 3 to 10 days, but can last up to 2 weeks.  It is important to know that upper respiratory infections do not cause serious illness or complications in most cases.   ° °Upper respiratory infections can be transmitted from person to person, with the most common method of transmission being a person's hands.  The virus is able to live on the skin and can infect other persons for up to 2 hours after direct contact.  Also, these can be transmitted when someone coughs or sneezes; thus, it is important to cover the mouth to reduce this risk.  To keep the spread of the illness at bay, good hand hygiene is very important. ° °This is an infection that is most likely caused by a virus. There are no specific treatments other than to help you with the symptoms until the infection runs its course.  We are sorry you are not feeling well.  Here is how we plan to help! ° ° °For nasal congestion, you may use an oral decongestants such as Mucinex D or if you have glaucoma or high blood pressure use plain Mucinex.  Saline nasal spray or nasal drops can help and can safely be used as often  as needed for congestion.  For your congestion, I have prescribed Ipratropium Bromide nasal spray 0.03% two sprays in each nostril 2-3 times a day ° °If you do not have a history of heart disease, hypertension, diabetes or thyroid disease, prostate/bladder issues or glaucoma, you may also use Sudafed to treat nasal congestion.  It is highly recommended that you consult with a pharmacist or your primary care physician to ensure this medication is safe for you to take.    ° °If you have a cough, you may use cough suppressants such as Delsym and Robitussin.  If you have glaucoma or high blood pressure, you can also use Coricidin HBP.   °For cough I have prescribed for you A prescription cough medication called Tessalon Perles 100 mg. You may take 1-2 capsules every 8 hours as needed for cough ° °If you have a sore or scratchy throat, use a saltwater gargle- ¼ to ½ teaspoon of salt dissolved in a 4-ounce to 8-ounce glass of warm water.  Gargle the solution for approximately 15-30 seconds and then spit.  It is important not to swallow the solution.  You can also use throat lozenges/cough drops and Chloraseptic spray to help with throat pain or discomfort.  Warm or cold liquids can also be helpful in relieving throat pain. ° °For headache, pain or general discomfort, you can use Ibuprofen or Tylenol as directed.   °  Some authorities believe that zinc sprays or the use of Echinacea may shorten the course of your symptoms. ° ° °HOME CARE °Only take medications as instructed by your medical team. °Be sure to drink plenty of fluids. Water is fine as well as fruit juices, sodas and electrolyte beverages. You may want to stay away from caffeine or alcohol. If you are nauseated, try taking small sips of liquids. How do you know if you are getting enough fluid? Your urine should be a pale yellow or almost colorless. °Get rest. °Taking a steamy shower or using a humidifier may help nasal congestion and ease sore throat pain. You  can place a towel over your head and breathe in the steam from hot water coming from a faucet. °Using a saline nasal spray works much the same way. °Cough drops, hard candies and sore throat lozenges may ease your cough. °Avoid close contacts especially the very young and the elderly °Cover your mouth if you cough or sneeze °Always remember to wash your hands.  ° °GET HELP RIGHT AWAY IF: °You develop worsening fever. °If your symptoms do not improve within 10 days °You develop yellow or green discharge from your nose over 3 days. °You have coughing fits °You develop a severe head ache or visual changes. °You develop shortness of breath, difficulty breathing or start having chest pain °Your symptoms persist after you have completed your treatment plan ° °MAKE SURE YOU  °Understand these instructions. °Will watch your condition. °Will get help right away if you are not doing well or get worse. ° °Thank you for choosing an e-visit. ° °Your e-visit answers were reviewed by a board certified advanced clinical practitioner to complete your personal care plan. Depending upon the condition, your plan could have included both over the counter or prescription medications. ° °Please review your pharmacy choice. Make sure the pharmacy is open so you can pick up prescription now. If there is a problem, you may contact your provider through MyChart messaging and have the prescription routed to another pharmacy.  Your safety is important to us. If you have drug allergies check your prescription carefully.  ° °For the next 24 hours you can use MyChart to ask questions about today's visit, request a non-urgent call back, or ask for a work or school excuse. °You will get an email in the next two days asking about your experience. I hope that your e-visit has been valuable and will speed your recovery. ° ° ° °I provided 5 minutes of non face-to-face time during this encounter for chart review, medication and order placement, as well  as and documentation.  ° °

## 2023-05-01 ENCOUNTER — Other Ambulatory Visit: Payer: Self-pay | Admitting: Primary Care

## 2023-05-01 DIAGNOSIS — G43709 Chronic migraine without aura, not intractable, without status migrainosus: Secondary | ICD-10-CM

## 2023-05-01 MED ORDER — BUTALBITAL-ASPIRIN-CAFFEINE 50-325-40 MG PO CAPS: 1.0000 | ORAL_CAPSULE | Freq: Four times a day (QID) | ORAL | 0 refills | Status: AC | PRN

## 2023-05-01 NOTE — Telephone Encounter (Signed)
From: Adrian Prince To: Office of Doreene Nest, NP Sent: 05/01/2023 7:46 AM EDT Subject: Medication Renewal Request  Refills have been requested for the following medications:   butalbital-aspirin-caffeine Benny Lennert) 50-325-40 MG capsule Keane Scrape Devonte Migues]  Preferred pharmacy: [id number]ann Sheen, [place] - [address] Delivery method: Daryll Drown

## 2023-05-02 ENCOUNTER — Telehealth: Payer: 59 | Admitting: Physician Assistant

## 2023-05-02 DIAGNOSIS — B3731 Acute candidiasis of vulva and vagina: Secondary | ICD-10-CM | POA: Diagnosis not present

## 2023-05-03 MED ORDER — FLUCONAZOLE 150 MG PO TABS: 150.0000 mg | ORAL_TABLET | Freq: Every day | ORAL | 0 refills | Status: DC

## 2023-05-03 NOTE — Progress Notes (Signed)

## 2023-05-08 ENCOUNTER — Ambulatory Visit: Payer: 59 | Admitting: Clinical

## 2023-05-13 ENCOUNTER — Telehealth: Payer: 59 | Admitting: Nurse Practitioner

## 2023-05-13 ENCOUNTER — Other Ambulatory Visit: Payer: Self-pay | Admitting: Nurse Practitioner

## 2023-05-13 DIAGNOSIS — R112 Nausea with vomiting, unspecified: Secondary | ICD-10-CM | POA: Diagnosis not present

## 2023-05-13 MED ORDER — ONDANSETRON HCL 4 MG PO TABS: 4.0000 mg | ORAL_TABLET | Freq: Three times a day (TID) | ORAL | 0 refills | Status: DC | PRN

## 2023-05-13 NOTE — Progress Notes (Signed)
E-Visit for Vomiting  We are sorry that you are not feeling well. Here is how we plan to help!  Based on what you have shared with me it looks like you have a Virus that is irritating your GI tract.  Vomiting is the forceful emptying of a portion of the stomach's content through the mouth.  Although nausea and vomiting can make you feel miserable, it's important to remember that these are not diseases, but rather symptoms of an underlying illness.  When we treat short term symptoms, we always caution that any symptoms that persist should be fully evaluated in a medical office.  I have prescribed a medication that will help alleviate your symptoms and allow you to stay hydrated:  Zofran 4 mg 1 tablet every 8 hours as needed for nausea and vomiting  HOME CARE: Drink clear liquids.  This is very important! Dehydration (the lack of fluid) can lead to a serious complication.  Start off with 1 tablespoon every 5 minutes for 8 hours. You may begin eating bland foods after 8 hours without vomiting.  Start with saltine crackers, white bread, rice, mashed potatoes, applesauce. After 48 hours on a bland diet, you may resume a normal diet. Try to go to sleep.  Sleep often empties the stomach and relieves the need to vomit.  GET HELP RIGHT AWAY IF:  Your symptoms do not improve or worsen within 2 days after treatment. You have a fever for over 3 days. You cannot keep down fluids after trying the medication.  MAKE SURE YOU:  Understand these instructions. Will watch your condition. Will get help right away if you are not doing well or get worse.   Thank you for choosing an e-visit.  Your e-visit answers were reviewed by a board certified advanced clinical practitioner to complete your personal care plan. Depending upon the condition, your plan could have included both over the counter or prescription medications.  Please review your pharmacy choice. Make sure the pharmacy is open so you can pick  up prescription now. If there is a problem, you may contact your provider through MyChart messaging and have the prescription routed to another pharmacy.  Your safety is important to us. If you have drug allergies check your prescription carefully.   For the next 24 hours you can use MyChart to ask questions about today's visit, request a non-urgent call back, or ask for a work or school excuse. You will get an email in the next two days asking about your experience. I hope that your e-visit has been valuable and will speed your recovery.  Mary-Margaret Shadae Reino, FNP   5-10 minutes spent reviewing and documenting in chart.  

## 2023-05-20 ENCOUNTER — Ambulatory Visit: Payer: 59 | Admitting: Clinical

## 2023-06-08 ENCOUNTER — Other Ambulatory Visit (HOSPITAL_COMMUNITY): Payer: Self-pay

## 2023-06-08 ENCOUNTER — Telehealth: Payer: Self-pay

## 2023-06-08 NOTE — Telephone Encounter (Signed)
Pharmacy Patient Advocate Encounter   Received notification that prior authorization for Ondansetron HCl 4MG  tablets is required/requested.   PA submitted to [employer] via CoverMyMeds Key or [facility]) confirmation # [id number] Status is pending

## 2023-06-09 ENCOUNTER — Other Ambulatory Visit: Payer: Self-pay | Admitting: Primary Care

## 2023-06-09 DIAGNOSIS — F411 Generalized anxiety disorder: Secondary | ICD-10-CM

## 2023-06-09 MED ORDER — HYDROXYZINE HCL 10 MG PO TABS: 10.0000 mg | ORAL_TABLET | Freq: Two times a day (BID) | ORAL | 0 refills | Status: DC | PRN

## 2023-06-09 NOTE — Telephone Encounter (Signed)
From: Adrian Prince To: Office of Doreene Nest, NP Sent: 06/08/2023 10:24 PM EDT Subject: Medication Renewal Request  Refills have been requested for the following medications:   hydrOXYzine (ATARAX) 10 MG tablet [Olly Shiner K Shylo Dillenbeck]  Preferred pharmacy: [id number], [place] - [address] Delivery method: Daryll Drown

## 2023-06-10 NOTE — Telephone Encounter (Signed)
Patient Advocate Encounter  Prior Authorization for Ondansetron HCl 4MG  tablets has been approved with [employer].    PA# [id number] Effective dates: 05/09/23 through 12/05/23

## 2023-06-18 ENCOUNTER — Encounter: Payer: Self-pay | Admitting: *Deleted

## 2023-06-18 ENCOUNTER — Emergency Department
Admission: EM | Admit: 2023-06-18 | Discharge: 2023-06-18 | Disposition: A | Discharge status: Home / Self Care | Payer: 59 | Attending: Emergency Medicine | Admitting: Emergency Medicine

## 2023-06-18 ENCOUNTER — Other Ambulatory Visit: Payer: Self-pay

## 2023-06-18 ENCOUNTER — Emergency Department: Payer: 59

## 2023-06-18 DIAGNOSIS — R079 Chest pain, unspecified: Secondary | ICD-10-CM | POA: Diagnosis present

## 2023-06-18 DIAGNOSIS — R209 Unspecified disturbances of skin sensation: Secondary | ICD-10-CM | POA: Diagnosis not present

## 2023-06-18 LAB — COMPREHENSIVE METABOLIC PANEL
ALT: 18 U/L (ref 0–44)
AST: 17 U/L (ref 15–41)
Albumin: 3.9 g/dL (ref 3.5–5.0)
Alkaline Phosphatase: 54 U/L (ref 38–126)
Anion gap: 6 (ref 5–15)
BUN: 10 mg/dL (ref 6–20)
CO2: 23 mmol/L (ref 22–32)
Calcium: 8.8 mg/dL — ABNORMAL LOW (ref 8.9–10.3)
Chloride: 105 mmol/L (ref 98–111)
Creatinine, Ser: 0.73 mg/dL (ref 0.44–1.00)
GFR, Estimated: >60 mL/min (ref >60)
Glucose, Bld: 139 mg/dL — ABNORMAL HIGH (ref 70–99)
Potassium: 4.1 mmol/L (ref 3.5–5.1)
Sodium: 134 mmol/L — ABNORMAL LOW (ref 135–145)
Total Bilirubin: 0.2 mg/dL — ABNORMAL LOW (ref 0.3–1.2)
Total Protein: 7.3 g/dL (ref 6.5–8.1)

## 2023-06-18 LAB — CBC WITH DIFFERENTIAL/PLATELET
Abs Immature Granulocytes: 0.02 10*3/uL (ref 0.00–0.07)
Basophils Absolute: 0.1 10*3/uL (ref 0.0–0.1)
Basophils Relative: 1 %
Eosinophils Absolute: 0.2 10*3/uL (ref 0.0–0.5)
Eosinophils Relative: 2 %
HCT: 44.1 % (ref 36.0–46.0)
Hemoglobin: 14.4 g/dL (ref 12.0–15.0)
Immature Granulocytes: 0 %
Lymphocytes Relative: 34 %
Lymphs Abs: 3.2 10*3/uL (ref 0.7–4.0)
MCH: 27.2 pg (ref 26.0–34.0)
MCHC: 32.7 g/dL (ref 30.0–36.0)
MCV: 83.4 fL (ref 80.0–100.0)
Monocytes Absolute: 0.5 10*3/uL (ref 0.1–1.0)
Monocytes Relative: 5 %
Neutro Abs: 5.5 10*3/uL (ref 1.7–7.7)
Neutrophils Relative %: 58 %
Platelets: 283 10*3/uL (ref 150–400)
RBC: 5.29 MIL/uL — ABNORMAL HIGH (ref 3.87–5.11)
RDW: 14.7 % (ref 11.5–15.5)
WBC: 9.5 10*3/uL (ref 4.0–10.5)
nRBC: 0 % (ref 0.0–0.2)

## 2023-06-18 LAB — TROPONIN I (HIGH SENSITIVITY)
Troponin I (High Sensitivity): <2 ng/L (ref <18)
Troponin I (High Sensitivity): <2 ng/L (ref <18)

## 2023-06-18 LAB — D-DIMER, QUANTITATIVE: D-Dimer, Quant: <0.27 ug/mL-FEU (ref 0.00–0.50)

## 2023-06-18 LAB — LIPASE, BLOOD: Lipase: 25 U/L (ref 11–51)

## 2023-06-18 MED ORDER — KETOROLAC TROMETHAMINE 15 MG/ML IJ SOLN
15.0000 mg | Freq: Once | INTRAMUSCULAR | Status: AC
  Administered 2023-06-18: 15 mg via INTRAVENOUS
  Filled 2023-06-18: qty 1

## 2023-06-18 MED ORDER — LORAZEPAM 2 MG/ML IJ SOLN
1.0000 mg | Freq: Once | INTRAMUSCULAR | Status: AC
  Administered 2023-06-18: 1 mg via INTRAVENOUS
  Filled 2023-06-18: qty 1

## 2023-06-18 MED ORDER — LIDOCAINE VISCOUS HCL 2 % MT SOLN
15.0000 mL | Freq: Once | OROMUCOSAL | Status: AC
  Administered 2023-06-18: 15 mL via ORAL
  Filled 2023-06-18: qty 15

## 2023-06-18 MED ORDER — SODIUM CHLORIDE 0.9% FLUSH: 3.0000 mL | Freq: Once | INTRAVENOUS | Status: DC

## 2023-06-18 MED ORDER — ALUM & MAG HYDROXIDE-SIMETH 200-200-20 MG/5ML PO SUSP
30.0000 mL | Freq: Once | ORAL | Status: AC
  Administered 2023-06-18: 30 mL via ORAL
  Filled 2023-06-18: qty 30

## 2023-06-18 MED ORDER — FENTANYL CITRATE PF 50 MCG/ML IJ SOSY
50.0000 ug | PREFILLED_SYRINGE | Freq: Once | INTRAMUSCULAR | Status: AC
  Administered 2023-06-18: 50 ug via INTRAVENOUS
  Filled 2023-06-18: qty 1

## 2023-06-18 NOTE — ED Notes (Signed)
EKG complete, and labs sent. Pt to xray

## 2023-06-18 NOTE — ED Notes (Signed)
Discharge instructions provided by edp Derrill Kay were reinforced to pt. Pt verbalized understanding with no additional questions at this time. Pt to go home with s/o at bedside.

## 2023-06-18 NOTE — Discharge Instructions (Signed)
Please seek medical attention for any high fevers, chest pain, shortness of breath, change in behavior, persistent vomiting, bloody stool or any other new or concerning symptoms.  

## 2023-06-18 NOTE — ED Triage Notes (Signed)
Here by POV from home for L sided CP with radiation to L arm, drove self to ED, onset last night, describes as radiating, worse with exertion, took metformin and glipizide PTA, no other meds PTA. Rates 8/10. Also endorses sob, light headedness, diaphoresis. H/o HTN and DM. Alert, NAD, calm, interactive.

## 2023-06-18 NOTE — ED Provider Notes (Signed)
[facility] Provider Note    Event Date/Time   First MD Initiated Contact with Patient 06/18/23 1503     (approximate)   History   Chest Pain   HPI  Kelli Cox is a 47 y.o. female  who presents to the emergency department today because of concern for chest pain. Woke her from sleep last night. Located in the left chest. Described as sharp. Worse with exertion. Has associated paresthesias going down the left arm. Pain has been constant since it started. Denies similar symptoms in the past. Denies any unusual activity or exertion yesterday.        Physical Exam   Triage Vital Signs: ED Triage Vitals  Encounter Vitals Group     BP 06/18/23 1323 136/78     Systolic BP Percentile --      Diastolic BP Percentile --      Pulse Rate 06/18/23 1323 91     Resp 06/18/23 1323 19     Temp 06/18/23 1323 98.1 F (36.7 C)     Temp Source 06/18/23 1323 Oral     SpO2 06/18/23 1323 99 %     Weight 06/18/23 1330 275 lb (124.7 kg)     Height 06/18/23 1330 5\' 5"  (1.651 m)     Head Circumference --      Peak Flow --      Pain Score 06/18/23 1330 8     Pain Loc --      Pain Education --      Exclude from Growth Chart --     Most recent vital signs: Vitals:   06/18/23 1323  BP: 136/78  Pulse: 91  Resp: 19  Temp: 98.1 F (36.7 C)  SpO2: 99%   General: Awake, alert, oriented. CV:  Good peripheral perfusion. Regular rate and rhythm. Resp:  Normal effort. Lungs clear. Abd:  No distention. Minimally tender in the right upper quadrant.   ED Results / Procedures / Treatments   Labs (all labs ordered are listed, but only abnormal results are displayed) Labs Reviewed  COMPREHENSIVE METABOLIC PANEL - Abnormal; Notable for the following components:      Result Value   Sodium 134 (*)    Glucose, Bld 139 (*)    Calcium 8.8 (*)    Total Bilirubin 0.2 (*)    All other components within normal limits  CBC WITH DIFFERENTIAL/PLATELET - Abnormal; Notable for  the following components:   RBC 5.29 (*)    All other components within normal limits  LIPASE, BLOOD  POC URINE PREG, ED  TROPONIN I (HIGH SENSITIVITY)  TROPONIN I (HIGH SENSITIVITY)     EKG  I, Phineas Semen, attending physician, personally viewed and interpreted this EKG  EKG Time: 1327 Rate: 88 Rhythm: normal sinus rhythm Axis: normal Intervals: qtc 445 QRS: narrow, q waves v1, v2 ST changes: no st elevation Impression: abnormal ekg    RADIOLOGY I independently interpreted and visualized the CXR. My interpretation: No pneumonia Radiology interpretation:  IMPRESSION:  No active cardiopulmonary disease.      PROCEDURES:  Critical Care performed: No   MEDICATIONS ORDERED IN ED: Medications  sodium chloride flush (NS) 0.9 % injection 3 mL (3 mLs Intravenous Not Given 06/18/23 1440)     IMPRESSION / MDM / ASSESSMENT AND PLAN / ED COURSE  I reviewed the triage vital signs and the nursing notes.  Differential diagnosis includes, but is not limited to, ACS, PE, pneumonia, esophagitis, anxiety  Patient's presentation is most consistent with acute presentation with potential threat to life or bodily function.   The patient is on the cardiac monitor to evaluate for evidence of arrhythmia and/or significant heart rate changes.  Patient presented to the emergency department today because of concern for chest pain. On exam lungs and heart sound normal. CXR without concerning findings. Initial troponin negative. Patient is on hormones for IVF. Will check d-dimer. Additionally will give trial of GI cocktail to evaluate for possible esophagitis.  Patient without significant relief from a GI cocktail.  Also tried Ativan without significant relief.  At this time I do think likely inflammation from chest wall.  The D-dimer was negative.  Patient did feel better after further pain medication.  I do think it is safe for patient be discharged at  this time.      FINAL CLINICAL IMPRESSION(S) / ED DIAGNOSES   Final diagnoses:  Nonspecific chest pain     Note:  This document was prepared using Dragon voice recognition software and may include unintentional dictation errors.    Phineas Semen, MD 06/18/23 2145

## 2023-06-18 NOTE — ED Notes (Signed)
Report received by previous RN. Pt continues to c/o unchanged left sided chest pain. Described pain as burning. Pain medication given per MD order. Pt updated POC. S/O at bedside. Pt remains on VS/Cardiac monitor. Denies any additional needs at this time. Call bell within reach

## 2023-06-19 ENCOUNTER — Ambulatory Visit (INDEPENDENT_AMBULATORY_CARE_PROVIDER_SITE_OTHER): Payer: 59 | Admitting: Primary Care

## 2023-06-19 ENCOUNTER — Telehealth: Payer: 59 | Admitting: Nurse Practitioner

## 2023-06-19 ENCOUNTER — Encounter: Payer: Self-pay | Admitting: Primary Care

## 2023-06-19 VITALS — BP 130/78 | HR 111 | Temp 97.9°F | Ht 65.0 in | Wt 275.0 lb

## 2023-06-19 DIAGNOSIS — M25512 Pain in left shoulder: Secondary | ICD-10-CM | POA: Diagnosis not present

## 2023-06-19 DIAGNOSIS — R071 Chest pain on breathing: Secondary | ICD-10-CM

## 2023-06-19 HISTORY — DX: Pain in left shoulder: M25.512

## 2023-06-19 MED ORDER — CYCLOBENZAPRINE HCL 5 MG PO TABS
5.0000 mg | ORAL_TABLET | Freq: Every evening | ORAL | 0 refills | Status: DC | PRN
Start: 2023-06-19 — End: 2023-07-01

## 2023-06-19 MED ORDER — PREDNISONE 20 MG PO TABS: ORAL_TABLET | ORAL | 0 refills | Status: DC

## 2023-06-19 NOTE — Patient Instructions (Addendum)
Start prednisone tablets. Take 3 tablets my mouth once daily in the morning for 3 days, then 2 tablets for 3 days, then 1 tablet for 3 days.  You can take Tylenol with the prednisone if needed.   You can take the muscle relaxer medication (cyclobenzaprine) at bedtime if needed. This may cause drowsiness.   It was a pleasure to see you today!

## 2023-06-19 NOTE — Progress Notes (Signed)
Because you have not improved since your visit to the ER, I feel your condition warrants further evaluation and I recommend that you be seen in a face to face visit.   If your primary care cannot see you today I would suggest an Urgent Care visit.   NOTE: There will be NO CHARGE for this eVisit   If you are having a true medical emergency please call 911.      For an urgent face to face visit, [facility] has eight urgent care centers for your convenience:   NEW!! [facility] Health Urgent Care Center at [facility] Get Driving Directions [phone] [address] [place], [address]    [facility] Center at [facility] Get Driving Directions [phone] [address] [place], [place] [address]   [facility] Health Urgent Care Center [facility]) Get Driving Directions [phone] [address] [place], [place] [address]  [facility] Health Urgent Care Center [facility]) Get Driving Directions [phone] [address] [place],  [place]  [address]  [facility] Health Urgent Care Center [facility] - at [employer]  [phone] [address] [place],  [place] [address]   [facility] Health Urgent Care at [facility] Get Driving Directions [phone] [address] [place], [place] [address]   [facility] Health Urgent Care at [facility] Get Driving Directions  [phone] [address] [place], [place] [address]   [facility] Health Urgent Care at [facility] Directions [phone] [address], [address] [place], [place] [address]  Your MyChart E-visit questionnaire answers were reviewed by a board certified advanced clinical practitioner to complete your personal care plan based on your specific symptoms.  Thank you for using e-Visits.

## 2023-06-19 NOTE — Progress Notes (Signed)
Subjective:    Patient ID: Kelli Cox, female    DOB: March 27, 1976, 47 y.o.   MRN: [id number]  HPI  Kelli Cox is a very pleasant 48 y.o. female with a history of type 2 diabetes, GAD, asthma who presents today for ED follow up.  She presented to [facility] ED yesterday for left sided chest pain that woke her from sleep the night prior. Worse with exertion, constant otherwise.   During her stay in the ED she underwent chest xray which was negative for acute findings. Her initial troponin was negative. D-dimer was negative. She was treated with a GI cocktail and Ativan without relief. She was discharged home after feeling better with pain medication.   Since her ED visit she's continued to experience chest pain. Her pain is located to the left upper chest, just below her left shoulder. Her pain is worse with left shoulder movement. She describes her pain as sharp, intermittent radiation to left upper extremity with numbness to her finger tips.   She's alternated Ibuprofen and Tylenol without much improvement. She has been lifting her dog more frequently as she is too old to get up and down from the bed. Otherwise, she denies injury/trauma.    Review of Systems  Musculoskeletal:  Positive for arthralgias and myalgias. Negative for neck pain.  Skin:  Negative for color change.  Neurological:  Positive for numbness.         Past Medical History:  Diagnosis Date   Acute non-recurrent sinusitis 10/17/2020   Asthma    Chicken pox    Concussion with no loss of consciousness 08/27/2021   GAD (generalized anxiety disorder)    Leg pain, lateral, left 07/31/2022   Migraines    Mild intermittent asthma with (acute) exacerbation 10/31/2022   Pain of upper abdomen 01/23/2021   Shortness of breath 11/18/2022   Type 2 diabetes mellitus (HCC)     Social History   Socioeconomic History   Marital status: Single    Spouse name: Not on file   Number of children: Not on file   Years of  education: Not on file   Highest education level: Master's degree (e.g., MA, MS, MEng, MEd, MSW, MBA)  Occupational History   Occupation: Emergency planning/management officer  Tobacco Use   Smoking status: Never   Smokeless tobacco: Never  Vaping Use   Vaping status: Never Used  Substance and Sexual Activity   Alcohol use: Yes    Comment: 1 drink every two weeks    Drug use: Never   Sexual activity: Yes    Partners: Male    Birth control/protection: None  Other Topics Concern   Not on file  Social History Narrative   Not on file   Social Determinants of Health   Financial Resource Strain: Low Risk  (04/20/2023)   Overall Financial Resource Strain (CARDIA)    Difficulty of Paying Living Expenses: Not hard at all  Food Insecurity: No Food Insecurity (04/20/2023)   Hunger Vital Sign    Worried About Running Out of Food in the Last Year: Never true    Ran Out of Food in the Last Year: Never true  Transportation Needs: No Transportation Needs (04/20/2023)   PRAPARE - Administrator, Civil Service (Medical): No    Lack of Transportation (Non-Medical): No  Physical Activity: Insufficiently Active (04/20/2023)   Exercise Vital Sign    Days of Exercise per Week: 3 days    Minutes of Exercise per Session:  30 min  Stress: Stress Concern Present (04/20/2023)   [employer] of Occupational Health - Occupational Stress Questionnaire    Feeling of Stress : To some extent  Social Connections: Moderately Isolated (04/20/2023)   Social Connection and Isolation Panel [NHANES]    Frequency of Communication with Friends and Family: More than three times a week    Frequency of Social Gatherings with Friends and Family: Once a week    Attends Religious Services: Never    Database administrator or Organizations: No    Attends Engineer, structural: Not on file    Marital Status: Living with partner  Intimate Partner Violence: Not on file    Past Surgical History:  Procedure Laterality Date    CHOLECYSTECTOMY  2008   COLONOSCOPY WITH PROPOFOL N/A 07/05/2021   Procedure: COLONOSCOPY WITH PROPOFOL;  Surgeon: Wyline Mood, MD;  Location[facility] ENDOSCOPY;  Service: Gastroenterology;  Laterality: N/A;   LIPOMA RESECTION  2012   WRIST SURGERY Left    2001 and 2007    Family History  Problem Relation Age of Onset   Arthritis Mother    Asthma Mother    Depression Mother    Hyperlipidemia Mother    Hypertension Mother    Kidney disease Mother    Cancer Father    Diabetes Father    Early death Father    Hyperlipidemia Father    Hypertension Father    Heart failure Father    Asthma Sister    Hypertension Sister    Miscarriages / Stillbirths Sister    Breast cancer Paternal Aunt 30   Osteoarthritis Maternal Grandmother    Asthma Maternal Grandmother    Cancer Maternal Grandmother    COPD Maternal Grandmother    Depression Maternal Grandmother    Early death Maternal Grandmother    Osteoarthritis Maternal Grandfather    Asthma Maternal Grandfather    COPD Maternal Grandfather    Hypertension Maternal Grandfather    Hyperlipidemia Maternal Grandfather    Cancer Paternal Grandmother    Diabetes Paternal Grandmother    Diabetes Paternal Grandfather    Hyperlipidemia Paternal Grandfather    Heart failure Paternal Grandfather    Kidney disease Paternal Grandfather    Alcohol abuse Brother    Hypertension Brother    Hyperlipidemia Other    Heart failure Other    Kidney disease Other     Allergies  Allergen Reactions   Clarithromycin Nausea And Vomiting    Current Outpatient Medications on File Prior to Visit  Medication Sig Dispense Refill   albuterol (VENTOLIN HFA) 108 (90 Base) MCG/ACT inhaler Inhale 1-2 puffs into the lungs every 6 (six) hours as needed for wheezing or shortness of breath. 1 each 0   ALPRAZolam (XANAX) 0.5 MG tablet Take 0.5-1 tablets (0.25-0.5 mg total) by mouth daily as needed for anxiety. 20 tablet 0   butalbital-aspirin-caffeine (FIORINAL)  50-325-40 MG capsule Take 1 capsule by mouth every 6 (six) hours as needed for headache. For headache/migraines 20 capsule 0   cetirizine (ZYRTEC) 10 MG tablet Take 1 tablet (10 mg total) by mouth daily. 30 tablet 11   Continuous Glucose Sensor (FREESTYLE LIBRE 3 SENSOR) MISC Use to check blood sugars continuously. Change every 14 days. 6 each 1   glipiZIDE (GLUCOTROL) 5 MG tablet TAKE 1 TABLET (5 MG TOTAL) BY MOUTH 2 (TWO) TIMES DAILY BEFORE A MEAL. FOR DIABETES. 180 tablet 0   hydrOXYzine (ATARAX) 10 MG tablet Take 1-2 tablets (10-20 mg total) by  mouth 2 (two) times daily as needed for anxiety. 60 tablet 0   letrozole (FEMARA) 2.5 MG tablet Take 2.5 mg by mouth daily.     metFORMIN (GLUCOPHAGE) 1000 MG tablet Take 1 tablet (1,000 mg total) by mouth 2 (two) times daily with a meal. For diabetes. 180 tablet 0   Multiple Vitamins-Minerals (MULTIVITAMIN WITH MINERALS) tablet Take 1 tablet by mouth daily.     norethindrone (AYGESTIN) 5 MG tablet Take 2.5 mg by mouth 2 (two) times daily.     sertraline (ZOLOFT) 50 MG tablet Take 1.5 tablets (75 mg total) by mouth daily. For anxiety. 135 tablet 0   WIXELA INHUB 100-50 MCG/ACT AEPB INHALE 1 PUFF INTO THE LUNGS TWICE A DAY 60 each 2   No current facility-administered medications on file prior to visit.    BP 130/78   Pulse (!) 111   Temp 97.9 F (36.6 C) (Temporal)   Ht 5\' 5"  (1.651 m)   Wt 275 lb (124.7 kg)   LMP 06/16/2023 (Exact Date)   SpO2 98%   BMI 45.76 kg/m  Objective:   Physical Exam Cardiovascular:     Rate and Rhythm: Normal rate and regular rhythm.  Chest:     Chest wall: Tenderness present. No swelling.       Comments: Tenderness to left upper chest wall distally to shoulder Musculoskeletal:     Left shoulder: No deformity. Decreased range of motion. Decreased strength.     Cervical back: No pain with movement, spinous process tenderness or muscular tenderness.     Comments: Decrease in ROM to left shoulder with abduction.  Cannot raise past 90 degrees. Decrease strength due to pain to left upper extremity. Negative empty can test.   Skin:    General: Skin is warm and dry.           Assessment & Plan:  Acute pain of left shoulder Assessment & Plan: Symptoms seem to be MSK and from shoulder rather than chest.  Reviewed ED notes, labs, imaging.  Start cyclobenzaprine 5 mg HS PRN. Start prednisone course. Take 3 tablets my mouth once daily in the morning for 3 days, then 2 tablets for 3 days, then 1 tablet for 3 days  Discussed Tylenol use.  IM Toradol 60 mg provided today.   Follow up PRN  Orders: -     predniSONE; Take 3 tablets my mouth once daily in the morning for 3 days, then 2 tablets for 3 days, then 1 tablet for 3 days  Dispense: 18 tablet; Refill: 0 -     Cyclobenzaprine HCl; Take 1 tablet (5 mg total) by mouth at bedtime as needed for muscle spasms.  Dispense: 10 tablet; Refill: 0        Doreene Nest, NP

## 2023-06-19 NOTE — Assessment & Plan Note (Signed)
Symptoms seem to be MSK and from shoulder rather than chest.  Reviewed ED notes, labs, imaging.  Start cyclobenzaprine 5 mg HS PRN. Start prednisone course. Take 3 tablets my mouth once daily in the morning for 3 days, then 2 tablets for 3 days, then 1 tablet for 3 days  Discussed Tylenol use.  IM Toradol 60 mg provided today.   Follow up PRN

## 2023-06-20 ENCOUNTER — Other Ambulatory Visit: Payer: Self-pay | Admitting: Primary Care

## 2023-06-20 DIAGNOSIS — E119 Type 2 diabetes mellitus without complications: Secondary | ICD-10-CM

## 2023-06-25 ENCOUNTER — Telehealth: Payer: 59 | Admitting: Physician Assistant

## 2023-06-25 DIAGNOSIS — R112 Nausea with vomiting, unspecified: Secondary | ICD-10-CM

## 2023-06-25 MED ORDER — ONDANSETRON 4 MG PO TBDP: 4.0000 mg | ORAL_TABLET | Freq: Three times a day (TID) | ORAL | 0 refills | Status: DC | PRN

## 2023-06-25 NOTE — Progress Notes (Signed)
I have spent 5 minutes in review of e-visit questionnaire, review and updating patient chart, medical decision making and response to patient.   William Cody Martin, PA-C    

## 2023-06-25 NOTE — Progress Notes (Signed)
E-Visit for Vomiting  We are sorry that you are not feeling well. Here is how we plan to help!  Based on what you have shared with me it looks like you have a Virus that is irritating your GI tract.  Vomiting is the forceful emptying of a portion of the stomach's content through the mouth.  Although nausea and vomiting can make you feel miserable, it's important to remember that these are not diseases, but rather symptoms of an underlying illness.  When we treat short term symptoms, we always caution that any symptoms that persist should be fully evaluated in a medical office.  I have prescribed a medication that will help alleviate your symptoms and allow you to stay hydrated:  Promethazine 25 mg take 1 tablet twice daily  Giving prednisone use and possibility that this medication is irritating your stomach lining, I recommend you reach out to your PCP who prescribed it to make them aware in case they want to make adjustments to treatment.  HOME CARE: Drink clear liquids.  This is very important! Dehydration (the lack of fluid) can lead to a serious complication.  Start off with 1 tablespoon every 5 minutes for 8 hours. You may begin eating bland foods after 8 hours without vomiting.  Start with saltine crackers, white bread, rice, mashed potatoes, applesauce. After 48 hours on a bland diet, you may resume a normal diet. Try to go to sleep.  Sleep often empties the stomach and relieves the need to vomit.  GET HELP RIGHT AWAY IF:  Your symptoms do not improve or worsen within 2 days after treatment. You have a fever for over 3 days. You cannot keep down fluids after trying the medication.  MAKE SURE YOU:  Understand these instructions. Will watch your condition. Will get help right away if you are not doing well or get worse.   Thank you for choosing an e-visit.  Your e-visit answers were reviewed by a board certified advanced clinical practitioner to complete your personal care  plan. Depending upon the condition, your plan could have included both over the counter or prescription medications.  Please review your pharmacy choice. Make sure the pharmacy is open so you can pick up prescription now. If there is a problem, you may contact your provider through [employer] and have the prescription routed to another pharmacy.  Your safety is important to [place]. If you have drug allergies check your prescription carefully.   For the next 24 hours you can use MyChart to ask questions about today's visit, request a non-urgent call back, or ask for a work or school excuse. You will get an email in the next two days asking about your experience. I hope that your e-visit has been valuable and will speed your recovery.

## 2023-07-01 ENCOUNTER — Ambulatory Visit: Payer: 59 | Admitting: Primary Care

## 2023-07-01 ENCOUNTER — Encounter: Payer: Self-pay | Admitting: Primary Care

## 2023-07-01 ENCOUNTER — Telehealth: Payer: Self-pay

## 2023-07-01 ENCOUNTER — Other Ambulatory Visit (HOSPITAL_COMMUNITY): Payer: Self-pay

## 2023-07-01 ENCOUNTER — Encounter (INDEPENDENT_AMBULATORY_CARE_PROVIDER_SITE_OTHER): Payer: Self-pay

## 2023-07-01 DIAGNOSIS — Z6841 Body Mass Index (BMI) 40.0 and over, adult: Secondary | ICD-10-CM

## 2023-07-01 DIAGNOSIS — Z7984 Long term (current) use of oral hypoglycemic drugs: Secondary | ICD-10-CM | POA: Diagnosis not present

## 2023-07-01 DIAGNOSIS — E1165 Type 2 diabetes mellitus with hyperglycemia: Secondary | ICD-10-CM | POA: Diagnosis not present

## 2023-07-01 DIAGNOSIS — E66812 Obesity, class 2: Secondary | ICD-10-CM | POA: Insufficient documentation

## 2023-07-01 DIAGNOSIS — E6609 Other obesity due to excess calories: Secondary | ICD-10-CM | POA: Insufficient documentation

## 2023-07-01 DIAGNOSIS — Z7985 Long-term (current) use of injectable non-insulin antidiabetic drugs: Secondary | ICD-10-CM | POA: Diagnosis not present

## 2023-07-01 LAB — MICROALBUMIN / CREATININE URINE RATIO
Creatinine,U: 64.4 mg/dL
Microalb Creat Ratio: 1.1 mg/g (ref 0.0–30.0)
Microalb, Ur: <0.7 mg/dL (ref 0.0–1.9)

## 2023-07-01 MED ORDER — TIRZEPATIDE 2.5 MG/0.5ML ~~LOC~~ SOAJ: 2.5000 mg | SUBCUTANEOUS | 0 refills | Status: DC

## 2023-07-01 MED ORDER — FREESTYLE LIBRE 3 SENSOR MISC
1 refills | Status: DC
Start: 2023-07-01 — End: 2023-10-12

## 2023-07-01 NOTE — Telephone Encounter (Signed)
Pharmacy Patient Advocate Encounter  Received notification from [employer] that Prior Authorization for [facility] 2.5MG /0.5ML pen-injectors has been APPROVED from 06/01/23 to 06/30/24. Ran test claim, Copay is $25   PA #/Case ID/Reference #: [id number]

## 2023-07-01 NOTE — Assessment & Plan Note (Signed)
Improved but not at goal of A1c less than 6.5.  Add Mounjaro 2.5 mg weekly as she cannot tolerate Ozempic. Continue glipizide 5 mg twice daily, metformin 1000 mg twice daily.  Urine microalbumin due and pending. Foot exam today.  Follow-up in 1 month.

## 2023-07-01 NOTE — Progress Notes (Signed)
Subjective:    Patient ID: Kelli Cox, female    DOB: [birth year], 47 y.o.   MRN: [id number]  HPI  Kelli Cox is a very pleasant 47 y.o. female with a history of type 2 diabetes, female infertility, asthma, obesity who presents today to discuss initiation of GLP-1 agonist for diabetes and weight control.   Current medications for diabetes include: Glipizide 5 mg twice daily, metformin 1000 mg twice daily  Last A1C: 6.09 Apr 2023 Last Eye Exam: Due Last Foot Exam: Due Pneumonia Vaccination: 2018 Urine Microalbumin: Due Statin: None.  She has a chronic history of obesity for decades. Her weight will yo-yo, has tried Weight Watchers multiple times, will lose weight but will regain. She's also tried regular exercise, Keto diet, Slim fast, calorie counting, macro counting.   She is exercising twice weekly on average.   She's tried Ozempic in the past which caused nausea, vomiting, GERD. She is interested in trying [place]. She was told that she needed to lose 20 pounds and A1C goal of 6.5 or lower for fertility specialist.   BP Readings from Last 3 Encounters:  07/01/23 128/86  06/19/23 130/78  06/18/23 (!) 144/83   Wt Readings from Last 3 Encounters:  07/01/23 275 lb (124.7 kg)  06/19/23 275 lb (124.7 kg)  06/18/23 275 lb (124.7 kg)      Review of Systems  Respiratory:  Negative for shortness of breath.   Cardiovascular:  Negative for chest pain.  Gastrointestinal:  Negative for constipation.  Neurological:  Negative for numbness.         Past Medical History:  Diagnosis Date   Acute non-recurrent sinusitis 10/17/2020   Asthma    Chicken pox    Concussion with no loss of consciousness 08/27/2021   GAD (generalized anxiety disorder)    Leg pain, lateral, left 07/31/2022   Migraines    Mild intermittent asthma with (acute) exacerbation 10/31/2022   Pain of upper abdomen 01/23/2021   Shortness of breath 11/18/2022   Type 2 diabetes mellitus (HCC)     Social  History   Socioeconomic History   Marital status: Single    Spouse name: Not on file   Number of children: Not on file   Years of education: Not on file   Highest education level: Master's degree (e.g., MA, MS, MEng, MEd, MSW, MBA)  Occupational History   Occupation: Emergency planning/management officer  Tobacco Use   Smoking status: Never   Smokeless tobacco: Never  Vaping Use   Vaping status: Never Used  Substance and Sexual Activity   Alcohol use: Yes    Comment: 1 drink every two weeks    Drug use: Never   Sexual activity: Yes    Partners: Male    Birth control/protection: None  Other Topics Concern   Not on file  Social History Narrative   Not on file   Social Determinants of Health   Financial Resource Strain: Low Risk  (04/20/2023)   Overall Financial Resource Strain (CARDIA)    Difficulty of Paying Living Expenses: Not hard at all  Food Insecurity: No Food Insecurity (04/20/2023)   Hunger Vital Sign    Worried About Running Out of Food in the Last Year: Never true    Ran Out of Food in the Last Year: Never true  Transportation Needs: No Transportation Needs (04/20/2023)   PRAPARE - Administrator, Civil Service (Medical): No    Lack of Transportation (Non-Medical): No  Physical Activity: Insufficiently  Active (04/20/2023)   Exercise Vital Sign    Days of Exercise per Week: 3 days    Minutes of Exercise per Session: 30 min  Stress: Stress Concern Present (04/20/2023)   [employer] of Occupational Health - Occupational Stress Questionnaire    Feeling of Stress : To some extent  Social Connections: Moderately Isolated (04/20/2023)   Social Connection and Isolation Panel [NHANES]    Frequency of Communication with Friends and Family: More than three times a week    Frequency of Social Gatherings with Friends and Family: Once a week    Attends Religious Services: Never    Database administrator or Organizations: No    Attends Engineer, structural: Not on file     Marital Status: Living with partner  Intimate Partner Violence: Not on file    Past Surgical History:  Procedure Laterality Date   CHOLECYSTECTOMY  2008   COLONOSCOPY WITH PROPOFOL N/A 07/05/2021   Procedure: COLONOSCOPY WITH PROPOFOL;  Surgeon: Wyline Mood, MD;  Location: [facility] ENDOSCOPY;  Service: Gastroenterology;  Laterality: N/A;   LIPOMA RESECTION  2012   WRIST SURGERY Left    2001 and 2007    Family History  Problem Relation Age of Onset   Arthritis Mother    Asthma Mother    Depression Mother    Hyperlipidemia Mother    Hypertension Mother    Kidney disease Mother    Cancer Father    Diabetes Father    Early death Father    Hyperlipidemia Father    Hypertension Father    Heart failure Father    Asthma Sister    Hypertension Sister    Miscarriages / Stillbirths Sister    Breast cancer Paternal Aunt 65   Osteoarthritis Maternal Grandmother    Asthma Maternal Grandmother    Cancer Maternal Grandmother    COPD Maternal Grandmother    Depression Maternal Grandmother    Early death Maternal Grandmother    Osteoarthritis Maternal Grandfather    Asthma Maternal Grandfather    COPD Maternal Grandfather    Hypertension Maternal Grandfather    Hyperlipidemia Maternal Grandfather    Cancer Paternal Grandmother    Diabetes Paternal Grandmother    Diabetes Paternal Grandfather    Hyperlipidemia Paternal Grandfather    Heart failure Paternal Grandfather    Kidney disease Paternal Grandfather    Alcohol abuse Brother    Hypertension Brother    Hyperlipidemia Other    Heart failure Other    Kidney disease Other     Allergies  Allergen Reactions   Clarithromycin Nausea And Vomiting    Current Outpatient Medications on File Prior to Visit  Medication Sig Dispense Refill   albuterol (VENTOLIN HFA) 108 (90 Base) MCG/ACT inhaler Inhale 1-2 puffs into the lungs every 6 (six) hours as needed for wheezing or shortness of breath. 1 each 0   ALPRAZolam (XANAX) 0.5 MG tablet  Take 0.5-1 tablets (0.25-0.5 mg total) by mouth daily as needed for anxiety. 20 tablet 0   butalbital-aspirin-caffeine (FIORINAL) 50-325-40 MG capsule Take 1 capsule by mouth every 6 (six) hours as needed for headache. For headache/migraines 20 capsule 0   cetirizine (ZYRTEC) 10 MG tablet Take 1 tablet (10 mg total) by mouth daily. 30 tablet 11   glipiZIDE (GLUCOTROL) 5 MG tablet TAKE 1 TABLET (5 MG TOTAL) BY MOUTH 2 (TWO) TIMES DAILY BEFORE A MEAL. FOR DIABETES. 180 tablet 0   hydrOXYzine (ATARAX) 10 MG tablet Take 1-2 tablets (10-20 mg  total) by mouth 2 (two) times daily as needed for anxiety. 60 tablet 0   letrozole (FEMARA) 2.5 MG tablet Take 2.5 mg by mouth daily.     metFORMIN (GLUCOPHAGE) 1000 MG tablet TAKE 1 TABLET (1,000 MG TOTAL) BY MOUTH 2 (TWO) TIMES DAILY WITH A MEAL. FOR DIABETES. 180 tablet 0   Multiple Vitamins-Minerals (MULTIVITAMIN WITH MINERALS) tablet Take 1 tablet by mouth daily.     norethindrone (AYGESTIN) 5 MG tablet Take 2.5 mg by mouth 2 (two) times daily.     sertraline (ZOLOFT) 50 MG tablet Take 1.5 tablets (75 mg total) by mouth daily. For anxiety. 135 tablet 0   WIXELA INHUB 100-50 MCG/ACT AEPB INHALE 1 PUFF INTO THE LUNGS TWICE A DAY 60 each 2   No current facility-administered medications on file prior to visit.    BP 128/86   Pulse 88   Temp (!) 97.3 F (36.3 C) (Temporal)   Ht 5\' 5"  (1.651 m)   Wt 275 lb (124.7 kg)   LMP 06/16/2023 (Exact Date)   SpO2 99%   BMI 45.76 kg/m  Objective:   Physical Exam Cardiovascular:     Rate and Rhythm: Normal rate and regular rhythm.  Pulmonary:     Effort: Pulmonary effort is normal.     Breath sounds: Normal breath sounds.  Musculoskeletal:     Cervical back: Neck supple.  Skin:    General: Skin is warm and dry.           Assessment & Plan:  Class 3 severe obesity due to excess calories with serious comorbidity and body mass index (BMI) of 45.0 to 49.9 in adult [facility]) Assessment & Plan: Start  tirzepitide [facility]) for diabetes/weight loss. Start by injecting 2.5 mg into the skin once weekly for 4 weeks, then increase to 5 mg once weekly thereafter.  Will titrate upward from there.  Weight loss goal 20 pounds.   Type 2 diabetes mellitus with hyperglycemia, without long-term current use of insulin (HCC) Assessment & Plan: Improved but not at goal of A1c less than 6.5.  Add Mounjaro 2.5 mg weekly as she cannot tolerate Ozempic. Continue glipizide 5 mg twice daily, metformin 1000 mg twice daily.  Urine microalbumin due and pending. Foot exam today.  Follow-up in 1 month.  Orders: -     Microalbumin / creatinine urine ratio -     Tirzepatide; Inject 2.5 mg into the skin once a week. for diabetes.  Dispense: 2 mL; Refill: 0 -     FreeStyle Libre 3 Sensor; Use to check blood sugars continuously. Change every 14 days.  Dispense: 6 each; Refill: 1        Doreene Nest, NP

## 2023-07-01 NOTE — Patient Instructions (Signed)
Stop by the lab prior to leaving today. I will notify you of your results once received.   Start tirzepitide [facility]) for diabetes/weight loss. Start by injecting 2.5 mg into the skin once weekly for 4 weeks, then increase to 5 mg once weekly thereafter. Please notify me once you've used your last 2.5 mg pen so that I can prescribe the next dose.   It was a pleasure to see you today!

## 2023-07-01 NOTE — Assessment & Plan Note (Signed)
Start tirzepitide Kelli Cox) for diabetes/weight loss. Start by injecting 2.5 mg into the skin once weekly for 4 weeks, then increase to 5 mg once weekly thereafter.  Will titrate upward from there.  Weight loss goal 20 pounds.

## 2023-07-16 ENCOUNTER — Other Ambulatory Visit: Payer: Self-pay | Admitting: Primary Care

## 2023-07-16 DIAGNOSIS — E1165 Type 2 diabetes mellitus with hyperglycemia: Secondary | ICD-10-CM

## 2023-07-16 MED ORDER — TIRZEPATIDE 5 MG/0.5ML ~~LOC~~ SOAJ: 5.0000 mg | SUBCUTANEOUS | 0 refills | Status: DC

## 2023-07-21 ENCOUNTER — Encounter: Payer: Self-pay | Admitting: Primary Care

## 2023-07-21 ENCOUNTER — Ambulatory Visit: Payer: 59 | Admitting: Primary Care

## 2023-07-21 VITALS — BP 130/70 | HR 85 | Temp 97.6°F | Ht 68.0 in | Wt 268.0 lb

## 2023-07-21 DIAGNOSIS — Z6841 Body Mass Index (BMI) 40.0 and over, adult: Secondary | ICD-10-CM | POA: Diagnosis not present

## 2023-07-21 DIAGNOSIS — Z7984 Long term (current) use of oral hypoglycemic drugs: Secondary | ICD-10-CM | POA: Diagnosis not present

## 2023-07-21 DIAGNOSIS — E1165 Type 2 diabetes mellitus with hyperglycemia: Secondary | ICD-10-CM | POA: Diagnosis not present

## 2023-07-21 DIAGNOSIS — Z7985 Long-term (current) use of injectable non-insulin antidiabetic drugs: Secondary | ICD-10-CM

## 2023-07-21 LAB — POCT GLYCOSYLATED HEMOGLOBIN (HGB A1C): Hemoglobin A1C: 6.2 % — AB (ref 4.0–5.6)

## 2023-07-21 NOTE — Progress Notes (Signed)
Subjective:    Patient ID: Kelli Cox, female    DOB: 11-29-1976, 47 y.o.   MRN: [id number]  HPI  Kelli Cox is a very pleasant 47 y.o. female with a history of migraines, asthma, type 2 diabetes, GAD who presents today for follow-up of diabetes and weight loss.  Current medications include: Glipizide 5 mg twice daily, metformin 1000 mg twice daily, Mounjaro 2.5 mg weekly.  She will use her last 2.5 mg pen today before transitioning up to 5 mg weekly.  She is checking her blood glucose continuously and is getting readings of:  AM fasting: 80's-120's Mid day: 130s Bedtime: 80s  She's had two readings of 61 and 63.  Last A1C: 6.9 in May 2024, 6.2 today Last Eye Exam: Due Last Foot Exam: Up-to-date Pneumonia Vaccination: 2018 Urine Microalbumin: Up-to-date Statin: None.  Dietary changes since last visit: Reduced cravings, smaller portion sizes, reducing intake of sugary food.    Exercise: Walking   Wt Readings from Last 3 Encounters:  07/21/23 268 lb (121.6 kg)  07/01/23 275 lb (124.7 kg)  06/19/23 275 lb (124.7 kg)       Review of Systems  Respiratory:  Negative for shortness of breath.   Cardiovascular:  Negative for chest pain.  Gastrointestinal:  Negative for constipation and diarrhea.  Neurological:  Negative for dizziness.         Past Medical History:  Diagnosis Date   Acute non-recurrent sinusitis 10/17/2020   Asthma    Chicken pox    Concussion with no loss of consciousness 08/27/2021   GAD (generalized anxiety disorder)    Leg pain, lateral, left 07/31/2022   Migraines    Mild intermittent asthma with (acute) exacerbation 10/31/2022   Pain of upper abdomen 01/23/2021   Shortness of breath 11/18/2022   Type 2 diabetes mellitus (HCC)     Social History   Socioeconomic History   Marital status: Single    Spouse name: Not on file   Number of children: Not on file   Years of education: Not on file   Highest education level: Master's  degree (e.g., MA, MS, MEng, MEd, MSW, MBA)  Occupational History   Occupation: Emergency planning/management officer  Tobacco Use   Smoking status: Never   Smokeless tobacco: Never  Vaping Use   Vaping status: Never Used  Substance and Sexual Activity   Alcohol use: Yes    Comment: 1 drink every two weeks    Drug use: Never   Sexual activity: Yes    Partners: Male    Birth control/protection: None  Other Topics Concern   Not on file  Social History Narrative   Not on file   Social Determinants of Health   Financial Resource Strain: Low Risk  (04/20/2023)   Overall Financial Resource Strain (CARDIA)    Difficulty of Paying Living Expenses: Not hard at all  Food Insecurity: No Food Insecurity (04/20/2023)   Hunger Vital Sign    Worried About Running Out of Food in the Last Year: Never true    Ran Out of Food in the Last Year: Never true  Transportation Needs: No Transportation Needs (04/20/2023)   PRAPARE - Administrator, Civil Service (Medical): No    Lack of Transportation (Non-Medical): No  Physical Activity: Insufficiently Active (04/20/2023)   Exercise Vital Sign    Days of Exercise per Week: 3 days    Minutes of Exercise per Session: 30 min  Stress: Stress Concern Present (04/20/2023)  [employer] of Occupational Health - Occupational Stress Questionnaire    Feeling of Stress : To some extent  Social Connections: Moderately Isolated (04/20/2023)   Social Connection and Isolation Panel [NHANES]    Frequency of Communication with Friends and Family: More than three times a week    Frequency of Social Gatherings with Friends and Family: Once a week    Attends Religious Services: Never    Database administrator or Organizations: No    Attends Engineer, structural: Not on file    Marital Status: Living with partner  Intimate Partner Violence: Not on file    Past Surgical History:  Procedure Laterality Date   CHOLECYSTECTOMY  2008   COLONOSCOPY WITH PROPOFOL N/A  07/05/2021   Procedure: COLONOSCOPY WITH PROPOFOL;  Surgeon: Wyline Mood, MD;  Location: [facility] ENDOSCOPY;  Service: Gastroenterology;  Laterality: N/A;   LIPOMA RESECTION  2012   WRIST SURGERY Left    2001 and 2007    Family History  Problem Relation Age of Onset   Arthritis Mother    Asthma Mother    Depression Mother    Hyperlipidemia Mother    Hypertension Mother    Kidney disease Mother    Cancer Father    Diabetes Father    Early death Father    Hyperlipidemia Father    Hypertension Father    Heart failure Father    Asthma Sister    Hypertension Sister    Miscarriages / Stillbirths Sister    Breast cancer Paternal Aunt 62   Osteoarthritis Maternal Grandmother    Asthma Maternal Grandmother    Cancer Maternal Grandmother    COPD Maternal Grandmother    Depression Maternal Grandmother    Early death Maternal Grandmother    Osteoarthritis Maternal Grandfather    Asthma Maternal Grandfather    COPD Maternal Grandfather    Hypertension Maternal Grandfather    Hyperlipidemia Maternal Grandfather    Cancer Paternal Grandmother    Diabetes Paternal Grandmother    Diabetes Paternal Grandfather    Hyperlipidemia Paternal Grandfather    Heart failure Paternal Grandfather    Kidney disease Paternal Grandfather    Alcohol abuse Brother    Hypertension Brother    Hyperlipidemia Other    Heart failure Other    Kidney disease Other     Allergies  Allergen Reactions   Clarithromycin Nausea And Vomiting    Current Outpatient Medications on File Prior to Visit  Medication Sig Dispense Refill   albuterol (VENTOLIN HFA) 108 (90 Base) MCG/ACT inhaler Inhale 1-2 puffs into the lungs every 6 (six) hours as needed for wheezing or shortness of breath. 1 each 0   ALPRAZolam (XANAX) 0.5 MG tablet Take 0.5-1 tablets (0.25-0.5 mg total) by mouth daily as needed for anxiety. 20 tablet 0   butalbital-aspirin-caffeine (FIORINAL) 50-325-40 MG capsule Take 1 capsule by mouth every 6 (six)  hours as needed for headache. For headache/migraines 20 capsule 0   cetirizine (ZYRTEC) 10 MG tablet Take 1 tablet (10 mg total) by mouth daily. 30 tablet 11   Continuous Glucose Sensor (FREESTYLE LIBRE 3 SENSOR) MISC Use to check blood sugars continuously. Change every 14 days. 6 each 1   glipiZIDE (GLUCOTROL) 5 MG tablet TAKE 1 TABLET (5 MG TOTAL) BY MOUTH 2 (TWO) TIMES DAILY BEFORE A MEAL. FOR DIABETES. 180 tablet 0   hydrOXYzine (ATARAX) 10 MG tablet Take 1-2 tablets (10-20 mg total) by mouth 2 (two) times daily as needed for anxiety. 60  tablet 0   letrozole (FEMARA) 2.5 MG tablet Take 2.5 mg by mouth daily.     levothyroxine (SYNTHROID) 25 MCG tablet Take 25 mcg by mouth daily.     metFORMIN (GLUCOPHAGE) 1000 MG tablet TAKE 1 TABLET (1,000 MG TOTAL) BY MOUTH 2 (TWO) TIMES DAILY WITH A MEAL. FOR DIABETES. 180 tablet 0   Multiple Vitamins-Minerals (MULTIVITAMIN WITH MINERALS) tablet Take 1 tablet by mouth daily.     norethindrone (AYGESTIN) 5 MG tablet Take 2.5 mg by mouth 2 (two) times daily.     sertraline (ZOLOFT) 50 MG tablet Take 1.5 tablets (75 mg total) by mouth daily. For anxiety. 135 tablet 0   tirzepatide (MOUNJARO) 5 MG/0.5ML Pen Inject 5 mg into the skin once a week. for diabetes. 2 mL 0   WIXELA INHUB 100-50 MCG/ACT AEPB INHALE 1 PUFF INTO THE LUNGS TWICE A DAY 60 each 2   No current facility-administered medications on file prior to visit.    BP 130/70   Pulse 85   Temp 97.6 F (36.4 C) (Temporal)   Ht 5\' 8"  (1.727 m)   Wt 268 lb (121.6 kg)   SpO2 98%   BMI 40.75 kg/m  Objective:   Physical Exam Cardiovascular:     Rate and Rhythm: Normal rate and regular rhythm.  Pulmonary:     Effort: Pulmonary effort is normal.     Breath sounds: Normal breath sounds.  Musculoskeletal:     Cervical back: Neck supple.  Skin:    General: Skin is warm and dry.           Assessment & Plan:  Type 2 diabetes mellitus with hyperglycemia, without long-term current use of  insulin (HCC) Assessment & Plan: Improved with A1c of 6.2 today!  Increase Mounjaro to 5 mg weekly as scheduled. Reduce glipizide to 5 mg once daily.  Consider discontinuation if glucose readings continue to lower.  She will update. Continue metformin 1000 mg twice daily.  Commended her on weight loss.  Follow-up in 6 months.  Orders: -     POCT glycosylated hemoglobin (Hb A1C)  Class 3 severe obesity due to excess calories with serious comorbidity and body mass index (BMI) of 40.0 to 44.9 in adult [facility]) Assessment & Plan: 7 pound weight loss in 1 month!  Increase Mounjaro to 5 mg weekly as scheduled. Continue to monitor.         Doreene Nest, NP

## 2023-07-21 NOTE — Patient Instructions (Signed)
Increase your Mounjaro to 5 mg weekly as scheduled.  Notify me once you use your third pen so we can consider a dose adjustment to 7.5 mg weekly if needed.  Reduce glipizide to 5 mg once daily. Continue metformin.  Please schedule a follow up visit for 6 months for a diabetes check.  It was a pleasure to see you today!

## 2023-07-21 NOTE — Assessment & Plan Note (Signed)
Improved with A1c of 6.2 today!  Increase Mounjaro to 5 mg weekly as scheduled. Reduce glipizide to 5 mg once daily.  Consider discontinuation if glucose readings continue to lower.  She will update. Continue metformin 1000 mg twice daily.  Commended her on weight loss.  Follow-up in 6 months.

## 2023-07-21 NOTE — Assessment & Plan Note (Signed)
7 pound weight loss in 1 month!  Increase Mounjaro to 5 mg weekly as scheduled. Continue to monitor.

## 2023-07-23 ENCOUNTER — Telehealth: Payer: 59 | Admitting: Nurse Practitioner

## 2023-07-23 ENCOUNTER — Ambulatory Visit: Payer: 59 | Admitting: Primary Care

## 2023-07-23 DIAGNOSIS — J069 Acute upper respiratory infection, unspecified: Secondary | ICD-10-CM

## 2023-07-23 MED ORDER — IPRATROPIUM BROMIDE 0.03 % NA SOLN
2.0000 | Freq: Two times a day (BID) | NASAL | 12 refills | Status: DC
Start: 2023-07-23 — End: 2023-09-14

## 2023-07-23 MED ORDER — BENZONATATE 100 MG PO CAPS: 100.0000 mg | ORAL_CAPSULE | Freq: Three times a day (TID) | ORAL | 0 refills | Status: DC | PRN

## 2023-07-23 NOTE — Progress Notes (Signed)
E-Visit for Upper Respiratory Infection   We are sorry you are not feeling well.  Here is how we plan to help!  Sore throats that are related to congestion and cough are typically from a virus. You may want to consider a home COVID test if you would like as the current strain has produced similar symptoms.   Based on what you have shared with me, it looks like you may have a viral upper respiratory infection.  Upper respiratory infections are caused by a large number of viruses; however, rhinovirus is the most common cause.   Symptoms vary from person to person, with common symptoms including sore throat, cough, fatigue or lack of energy and feeling of general discomfort.  A low-grade fever of up to 100.4 may present, but is often uncommon.  Symptoms vary however, and are closely related to a person's age or underlying illnesses.  The most common symptoms associated with an upper respiratory infection are nasal discharge or congestion, cough, sneezing, headache and pressure in the ears and face.  These symptoms usually persist for about 3 to 10 days, but can last up to 2 weeks.  It is important to know that upper respiratory infections do not cause serious illness or complications in most cases.    Upper respiratory infections can be transmitted from person to person, with the most common method of transmission being a person's hands.  The virus is able to live on the skin and can infect other persons for up to 2 hours after direct contact.  Also, these can be transmitted when someone coughs or sneezes; thus, it is important to cover the mouth to reduce this risk.  To keep the spread of the illness at bay, good hand hygiene is very important.  This is an infection that is most likely caused by a virus. There are no specific treatments other than to help you with the symptoms until the infection runs its course.  We are sorry you are not feeling well.  Here is how we plan to help!   For nasal  congestion, you may use an oral decongestants such as Mucinex D or if you have glaucoma or high blood pressure use plain Mucinex.  Saline nasal spray or nasal drops can help and can safely be used as often as needed for congestion.  For your congestion, I have prescribed Ipratropium Bromide nasal spray 0.03% two sprays in each nostril 2-3 times a day  If you do not have a history of heart disease, hypertension, diabetes or thyroid disease, prostate/bladder issues or glaucoma, you may also use Sudafed to treat nasal congestion.  It is highly recommended that you consult with a pharmacist or your primary care physician to ensure this medication is safe for you to take.     If you have a cough, you may use cough suppressants such as Delsym and Robitussin.  If you have glaucoma or high blood pressure, you can also use Coricidin HBP.   For cough I have prescribed for you A prescription cough medication called Tessalon Perles 100 mg. You may take 1-2 capsules every 8 hours as needed for cough  If you have a sore or scratchy throat, use a saltwater gargle-  to  teaspoon of salt dissolved in a 4-ounce to 8-ounce glass of warm water.  Gargle the solution for approximately 15-30 seconds and then spit.  It is important not to swallow the solution.  You can also use throat lozenges/cough drops and Chloraseptic spray  to help with throat pain or discomfort.  Warm or cold liquids can also be helpful in relieving throat pain.  For headache, pain or general discomfort, you can use Ibuprofen or Tylenol as directed.   Some authorities believe that zinc sprays or the use of Echinacea may shorten the course of your symptoms.   HOME CARE Only take medications as instructed by your medical team. Be sure to drink plenty of fluids. Water is fine as well as fruit juices, sodas and electrolyte beverages. You may want to stay away from caffeine or alcohol. If you are nauseated, try taking small sips of liquids. How do you know  if you are getting enough fluid? Your urine should be a pale yellow or almost colorless. Get rest. Taking a steamy shower or using a humidifier may help nasal congestion and ease sore throat pain. You can place a towel over your head and breathe in the steam from hot water coming from a faucet. Using a saline nasal spray works much the same way. Cough drops, hard candies and sore throat lozenges may ease your cough. Avoid close contacts especially the very young and the elderly Cover your mouth if you cough or sneeze Always remember to wash your hands.   GET HELP RIGHT AWAY IF: You develop worsening fever. If your symptoms do not improve within 10 days You develop yellow or green discharge from your nose over 3 days. You have coughing fits You develop a severe head ache or visual changes. You develop shortness of breath, difficulty breathing or start having chest pain Your symptoms persist after you have completed your treatment plan  MAKE SURE YOU  Understand these instructions. Will watch your condition. Will get help right away if you are not doing well or get worse.  Thank you for choosing an e-visit.  Your e-visit answers were reviewed by a board certified advanced clinical practitioner to complete your personal care plan. Depending upon the condition, your plan could have included both over the counter or prescription medications.  Please review your pharmacy choice. Make sure the pharmacy is open so you can pick up prescription now. If there is a problem, you may contact your provider through [employer] and have the prescription routed to another pharmacy.  Your safety is important to [place]. If you have drug allergies check your prescription carefully.   For the next 24 hours you can use MyChart to ask questions about today's visit, request a non-urgent call back, or ask for a work or school excuse. You will get an email in the next two days asking about your experience. I  hope that your e-visit has been valuable and will speed your recovery.  Meds ordered this encounter  Medications   ipratropium (ATROVENT) 0.03 % nasal spray    Sig: Place 2 sprays into both nostrils every 12 (twelve) hours.    Dispense:  30 mL    Refill:  12   benzonatate (TESSALON) 100 MG capsule    Sig: Take 1 capsule (100 mg total) by mouth 3 (three) times daily as needed.    Dispense:  30 capsule    Refill:  0    I spent approximately 5 minutes reviewing the patient's history, current symptoms and coordinating their care today.

## 2023-07-25 ENCOUNTER — Other Ambulatory Visit: Payer: Self-pay | Admitting: Primary Care

## 2023-07-25 DIAGNOSIS — F411 Generalized anxiety disorder: Secondary | ICD-10-CM

## 2023-07-27 ENCOUNTER — Other Ambulatory Visit: Payer: Self-pay | Admitting: Primary Care

## 2023-07-27 DIAGNOSIS — E1165 Type 2 diabetes mellitus with hyperglycemia: Secondary | ICD-10-CM

## 2023-08-10 ENCOUNTER — Other Ambulatory Visit: Payer: Self-pay | Admitting: Primary Care

## 2023-08-10 DIAGNOSIS — E1165 Type 2 diabetes mellitus with hyperglycemia: Secondary | ICD-10-CM

## 2023-08-10 MED ORDER — TIRZEPATIDE 7.5 MG/0.5ML ~~LOC~~ SOAJ: 7.5000 mg | SUBCUTANEOUS | 0 refills | Status: DC

## 2023-08-19 ENCOUNTER — Other Ambulatory Visit: Payer: Self-pay | Admitting: Primary Care

## 2023-08-19 DIAGNOSIS — F411 Generalized anxiety disorder: Secondary | ICD-10-CM

## 2023-08-25 ENCOUNTER — Ambulatory Visit (INDEPENDENT_AMBULATORY_CARE_PROVIDER_SITE_OTHER): Payer: 59

## 2023-08-25 DIAGNOSIS — Z23 Encounter for immunization: Secondary | ICD-10-CM

## 2023-08-25 NOTE — Progress Notes (Signed)
Patient presented today for her flu shot. Vaccination was given by Jaynee Eagles, CMA in the patient's L Deltoid. Patient tolerated well with no complaints.

## 2023-08-27 ENCOUNTER — Telehealth: Payer: 59 | Admitting: Physician Assistant

## 2023-08-27 DIAGNOSIS — J019 Acute sinusitis, unspecified: Secondary | ICD-10-CM | POA: Diagnosis not present

## 2023-08-27 DIAGNOSIS — B9689 Other specified bacterial agents as the cause of diseases classified elsewhere: Secondary | ICD-10-CM | POA: Diagnosis not present

## 2023-08-27 DIAGNOSIS — J4521 Mild intermittent asthma with (acute) exacerbation: Secondary | ICD-10-CM

## 2023-08-27 MED ORDER — PREDNISONE 20 MG PO TABS
40.0000 mg | ORAL_TABLET | Freq: Every day | ORAL | 0 refills | Status: DC
Start: 2023-08-27 — End: 2023-09-14

## 2023-08-27 MED ORDER — BENZONATATE 100 MG PO CAPS: 100.0000 mg | ORAL_CAPSULE | Freq: Three times a day (TID) | ORAL | 0 refills | Status: DC | PRN

## 2023-08-27 MED ORDER — AMOXICILLIN-POT CLAVULANATE 875-125 MG PO TABS
1.0000 | ORAL_TABLET | Freq: Two times a day (BID) | ORAL | 0 refills | Status: DC
Start: 2023-08-27 — End: 2023-09-14

## 2023-08-27 NOTE — Progress Notes (Signed)
E-Visit for Sinus Problems  We are sorry that you are not feeling well.  Here is how we plan to help!  Based on what you have shared with me it looks like you have sinusitis and mild exacerbation of your asthma.  Sinusitis is inflammation and infection in the sinus cavities of the head.  Based on your presentation I believe you most likely have Acute Bacterial Sinusitis.  This is an infection caused by bacteria and is treated with antibiotics. I have prescribed Augmentin 875mg /125mg  one tablet twice daily with food, for 7 days. I have also sent in a 5-day burst of prednisone and a prescription cough medication, to take as directed. You may use an oral decongestant such as Mucinex D or if you have glaucoma or high blood pressure use plain Mucinex. Saline nasal spray help and can safely be used as often as needed for congestion.  If you develop worsening sinus pain, fever or notice severe headache and vision changes, or if symptoms are not better after completion of antibiotic, please schedule an appointment with a health care provider.    Sinus infections are not as easily transmitted as other respiratory infection, however we still recommend that you avoid close contact with loved ones, especially the very young and elderly.  Remember to wash your hands thoroughly throughout the day as this is the number one way to prevent the spread of infection!  Home Care: Only take medications as instructed by your medical team. Complete the entire course of an antibiotic. Do not take these medications with alcohol. A steam or ultrasonic humidifier can help congestion.  You can place a towel over your head and breathe in the steam from hot water coming from a faucet. Avoid close contacts especially the very young and the elderly. Cover your mouth when you cough or sneeze. Always remember to wash your hands.  Get Help Right Away If: You develop worsening fever or sinus pain. You develop a severe head ache or  visual changes. Your symptoms persist after you have completed your treatment plan.  Make sure you Understand these instructions. Will watch your condition. Will get help right away if you are not doing well or get worse.  Thank you for choosing an e-visit.  Your e-visit answers were reviewed by a board certified advanced clinical practitioner to complete your personal care plan. Depending upon the condition, your plan could have included both over the counter or prescription medications.  Please review your pharmacy choice. Make sure the pharmacy is open so you can pick up prescription now. If there is a problem, you may contact your provider through [employer] and have the prescription routed to another pharmacy.  Your safety is important to [place]. If you have drug allergies check your prescription carefully.   For the next 24 hours you can use MyChart to ask questions about today's visit, request a non-urgent call back, or ask for a work or school excuse. You will get an email in the next two days asking about your experience. I hope that your e-visit has been valuable and will speed your recovery.

## 2023-08-27 NOTE — Progress Notes (Signed)
I have spent 5 minutes in review of e-visit questionnaire, review and updating patient chart, medical decision making and response to patient.   Mia Milan Cody Jacklynn Dehaas, PA-C    

## 2023-09-02 ENCOUNTER — Telehealth: Payer: 59 | Admitting: Physician Assistant

## 2023-09-02 DIAGNOSIS — B999 Unspecified infectious disease: Secondary | ICD-10-CM

## 2023-09-02 NOTE — Progress Notes (Signed)
Because you are still having substantial issue with symptoms despite prior treatment via e-visit last week, I feel your condition warrants further evaluation and I recommend that you be seen in a face to face visit. You will either need to submit a video visit through your MyChart home page, reach out to your PCP, or be seen at an in-person urgent care location (see below).   NOTE: There will be NO CHARGE for this eVisit   If you are having a true medical emergency please call 911.      For an urgent face to face visit, [facility] has eight urgent care centers for your convenience:   NEW!! [facility] Center at [facility] Get Driving Directions [phone] [address], [address] [place], [address]    [facility] Health Urgent Care Center at [facility] Get Driving Directions [phone] [address] [place], [place] [address]   [facility] Health Urgent Care Center [facility]) Get Driving Directions [phone] [address] [place], [place] [address]  [facility] Health Urgent Care Center [facility]) Get Driving Directions [phone] [address] [place],  [place]  [address]  [facility] Health Urgent Care Center [facility] - at [employer]  [phone] [address] [place],  [place] [address]   [facility] Health Urgent Care at [facility] Get Driving Directions [phone] [address] [place], [place] [address]   [facility] Health Urgent Care at [facility] Get Driving Directions  [phone] [address] [place], [place] [address]   [facility] Health Urgent Care at [facility] Directions [phone] [address], [address] [place], [place] [address]  Your MyChart E-visit questionnaire answers were reviewed by a board certified advanced clinical practitioner to complete your personal care plan based on your specific symptoms.  Thank  you for using e-Visits.

## 2023-09-04 ENCOUNTER — Ambulatory Visit: Payer: 59 | Admitting: Primary Care

## 2023-09-10 ENCOUNTER — Other Ambulatory Visit: Payer: Self-pay | Admitting: Primary Care

## 2023-09-10 ENCOUNTER — Telehealth: Payer: 59 | Admitting: Family Medicine

## 2023-09-10 DIAGNOSIS — R112 Nausea with vomiting, unspecified: Secondary | ICD-10-CM | POA: Diagnosis not present

## 2023-09-10 DIAGNOSIS — E119 Type 2 diabetes mellitus without complications: Secondary | ICD-10-CM

## 2023-09-11 MED ORDER — ONDANSETRON 4 MG PO TBDP: 4.0000 mg | ORAL_TABLET | Freq: Three times a day (TID) | ORAL | 0 refills | Status: DC | PRN

## 2023-09-11 NOTE — Progress Notes (Signed)
E-Visit for Vomiting  We are sorry that you are not feeling well. Here is how we plan to help!  Based on what you have shared with me it looks like you have a Virus that is irritating your GI tract.  Vomiting is the forceful emptying of a portion of the stomach's content through the mouth.  Although nausea and vomiting can make you feel miserable, it's important to remember that these are not diseases, but rather symptoms of an underlying illness.  When we treat short term symptoms, we always caution that any symptoms that persist should be fully evaluated in a medical office.  I have prescribed a medication that will help alleviate your symptoms and allow you to stay hydrated:  Zofran 4 mg 1 tablet every 8 hours as needed for nausea and vomiting  HOME CARE: Drink clear liquids.  This is very important! Dehydration (the lack of fluid) can lead to a serious complication.  Start off with 1 tablespoon every 5 minutes for 8 hours. You may begin eating bland foods after 8 hours without vomiting.  Start with saltine crackers, white bread, rice, mashed potatoes, applesauce. After 48 hours on a bland diet, you may resume a normal diet. Try to go to sleep.  Sleep often empties the stomach and relieves the need to vomit.  GET HELP RIGHT AWAY IF:  Your symptoms do not improve or worsen within 2 days after treatment. You have a fever for over 3 days. You cannot keep down fluids after trying the medication.  MAKE SURE YOU:  Understand these instructions. Will watch your condition. Will get help right away if you are not doing well or get worse.   Thank you for choosing an e-visit.  Your e-visit answers were reviewed by a board certified advanced clinical practitioner to complete your personal care plan. Depending upon the condition, your plan could have included both over the counter or prescription medications.  Please review your pharmacy choice. Make sure the pharmacy is open so you can pick  up prescription now. If there is a problem, you may contact your provider through [employer] and have the prescription routed to another pharmacy.  Your safety is important to [place]. If you have drug allergies check your prescription carefully.   For the next 24 hours you can use MyChart to ask questions about today's visit, request a non-urgent call back, or ask for a work or school excuse. You will get an email in the next two days asking about your experience. I hope that your e-visit has been valuable and will speed your recovery.    have provided 5 minutes of non face to face time during this encounter for chart review and documentation.

## 2023-09-14 ENCOUNTER — Telehealth: Payer: 59 | Admitting: Physician Assistant

## 2023-09-14 DIAGNOSIS — M5442 Lumbago with sciatica, left side: Secondary | ICD-10-CM

## 2023-09-14 MED ORDER — CYCLOBENZAPRINE HCL 10 MG PO TABS
5.0000 mg | ORAL_TABLET | Freq: Three times a day (TID) | ORAL | 0 refills | Status: DC | PRN
Start: 2023-09-14 — End: 2023-09-24

## 2023-09-14 MED ORDER — NAPROXEN 500 MG PO TABS
500.0000 mg | ORAL_TABLET | Freq: Two times a day (BID) | ORAL | 0 refills | Status: DC
Start: 2023-09-14 — End: 2023-09-24

## 2023-09-14 NOTE — Progress Notes (Signed)

## 2023-09-24 ENCOUNTER — Ambulatory Visit: Payer: 59 | Admitting: Primary Care

## 2023-09-24 VITALS — BP 110/74 | HR 99 | Temp 98.2°F | Ht 68.0 in | Wt 253.0 lb

## 2023-09-24 DIAGNOSIS — Z7985 Long-term (current) use of injectable non-insulin antidiabetic drugs: Secondary | ICD-10-CM

## 2023-09-24 DIAGNOSIS — M5442 Lumbago with sciatica, left side: Secondary | ICD-10-CM | POA: Diagnosis not present

## 2023-09-24 DIAGNOSIS — E66812 Obesity, class 2: Secondary | ICD-10-CM | POA: Diagnosis not present

## 2023-09-24 DIAGNOSIS — E1165 Type 2 diabetes mellitus with hyperglycemia: Secondary | ICD-10-CM

## 2023-09-24 DIAGNOSIS — Z6838 Body mass index (BMI) 38.0-38.9, adult: Secondary | ICD-10-CM

## 2023-09-24 MED ORDER — METHOCARBAMOL 500 MG PO TABS
500.0000 mg | ORAL_TABLET | Freq: Three times a day (TID) | ORAL | 0 refills | Status: DC | PRN
Start: 2023-09-24 — End: 2023-10-01

## 2023-09-24 MED ORDER — PREDNISONE 20 MG PO TABS: ORAL_TABLET | ORAL | 0 refills | Status: DC

## 2023-09-24 MED ORDER — METHYLPREDNISOLONE ACETATE 80 MG/ML IJ SUSP
80.0000 mg | Freq: Once | INTRAMUSCULAR | Status: AC
  Administered 2023-09-24: 80 mg via INTRAMUSCULAR

## 2023-09-24 NOTE — Addendum Note (Signed)
Addended by: Wendie Simmer B on: 09/24/2023 09:14 AM   Modules accepted: Orders

## 2023-09-24 NOTE — Assessment & Plan Note (Signed)
Well-controlled! Agree to increase Mounjaro to 10 mg weekly. She will notify when she uses her last 7.5 mg dose.

## 2023-09-24 NOTE — Patient Instructions (Signed)
Start prednisone for sciatica. Take 3 tablets my mouth once daily x 3 days, then 2 tablets for 3 days, then 1 tablet for 3 days.  May take the methocarbamol muscle relaxer every 8 hours as needed.  Be sure to stretch and walk as discussed.  It was a pleasure to see you today!

## 2023-09-24 NOTE — Assessment & Plan Note (Signed)
Commended her on weight loss!  Encouraged continued exercise and following with weight watchers.  Agree to increase Mounjaro to 10 mg weekly.  She will update when she has completed her last 7.5 mg dose.

## 2023-09-24 NOTE — Progress Notes (Signed)
Subjective:    Patient ID: Kelli Cox, female    DOB: 1976/07/23, 47 y.o.   MRN: [id number]  Back Pain Associated symptoms include numbness. Pertinent negatives include no chest pain.    Kelli Cox is a very pleasant 47 y.o. female with a history of migraines, asthma, type 2 diabetes, lower extremity numbness, left shoulder pain who presents today to discuss back pain. She would also like to discuss increasing her Mounjaro to 10 mg weekly.  1) Back Pain: Symptom onset one week ago while exercising at the gym. Her pain is located to the left lower back with radiation of pain down through her toes. She's also experienced numbness/tingling to her left lower extremity through her toes.   She's recently been exercising at the gym, thinks she tweaked her back when twisting while exercising.   She completed an E-Visit one week ago, was prescribed cyclobenzaprine 10 mg TID and naproxen TID. She completed the course and has not seen improvement in symptoms, in fact, her pain is worse now.   She denies loss of bowel/bladder control.   2) Obesity/Type 2 Diabetes: Currently managed on Mounjaro 7.5 mg weekly for which she has been taking for 6 weeks. She would like to increase to 10 mg weekly.   She denies nausea, diarrhea, constipation. Her last A1C was 6.2 in September 2024.  She's working to improve her diet, is also combining with weight watchers, increasing protein. She's exercising in the gym 5 days weekly until recently.   BP Readings from Last 3 Encounters:  09/24/23 110/74  07/21/23 130/70  07/01/23 128/86   Wt Readings from Last 3 Encounters:  09/24/23 253 lb (114.8 kg)  07/21/23 268 lb (121.6 kg)  07/01/23 275 lb (124.7 kg)      Review of Systems  Respiratory:  Negative for shortness of breath.   Cardiovascular:  Negative for chest pain.  Gastrointestinal:  Negative for constipation, diarrhea and nausea.  Genitourinary:        Denies loss of bowel/bladder control   Musculoskeletal:  Positive for back pain.  Neurological:  Positive for numbness.         Past Medical History:  Diagnosis Date   Acute non-recurrent sinusitis 10/17/2020   Asthma    Chicken pox    Concussion with no loss of consciousness 08/27/2021   GAD (generalized anxiety disorder)    Leg pain, lateral, left 07/31/2022   Migraines    Mild intermittent asthma with (acute) exacerbation 10/31/2022   Pain of upper abdomen 01/23/2021   Shortness of breath 11/18/2022   Type 2 diabetes mellitus (HCC)     Social History   Socioeconomic History   Marital status: Single    Spouse name: Not on file   Number of children: Not on file   Years of education: Not on file   Highest education level: Master's degree (e.g., MA, MS, MEng, MEd, MSW, MBA)  Occupational History   Occupation: Emergency planning/management officer  Tobacco Use   Smoking status: Never   Smokeless tobacco: Never  Vaping Use   Vaping status: Never Used  Substance and Sexual Activity   Alcohol use: Yes    Comment: 1 drink every two weeks    Drug use: Never   Sexual activity: Yes    Partners: Male    Birth control/protection: None  Other Topics Concern   Not on file  Social History Narrative   Not on file   Social Determinants of Corporate investment banker  Strain: Medium Risk (09/21/2023)   Overall Financial Resource Strain (CARDIA)    Difficulty of Paying Living Expenses: Somewhat hard  Food Insecurity: No Food Insecurity (09/21/2023)   Hunger Vital Sign    Worried About Running Out of Food in the Last Year: Never true    Ran Out of Food in the Last Year: Never true  Transportation Needs: No Transportation Needs (09/21/2023)   PRAPARE - Administrator, Civil Service (Medical): No    Lack of Transportation (Non-Medical): No  Physical Activity: Insufficiently Active (09/21/2023)   Exercise Vital Sign    Days of Exercise per Week: 2 days    Minutes of Exercise per Session: 30 min  Stress: No Stress Concern  Present (09/21/2023)   [employer] of Occupational Health - Occupational Stress Questionnaire    Feeling of Stress : Only a little  Social Connections: Moderately Integrated (09/21/2023)   Social Connection and Isolation Panel [NHANES]    Frequency of Communication with Friends and Family: More than three times a week    Frequency of Social Gatherings with Friends and Family: More than three times a week    Attends Religious Services: 1 to 4 times per year    Active Member of [employer] or Organizations: No    Attends Banker Meetings: Not on file    Marital Status: Living with partner  Intimate Partner Violence: Not on file    Past Surgical History:  Procedure Laterality Date   CHOLECYSTECTOMY  2008   COLONOSCOPY WITH PROPOFOL N/A 07/05/2021   Procedure: COLONOSCOPY WITH PROPOFOL;  Surgeon: Wyline Mood, MD;  Location: [facility] ENDOSCOPY;  Service: Gastroenterology;  Laterality: N/A;   LIPOMA RESECTION  2012   WRIST SURGERY Left    2001 and 2007    Family History  Problem Relation Age of Onset   Arthritis Mother    Asthma Mother    Depression Mother    Hyperlipidemia Mother    Hypertension Mother    Kidney disease Mother    Cancer Father    Diabetes Father    Early death Father    Hyperlipidemia Father    Hypertension Father    Heart failure Father    Asthma Sister    Hypertension Sister    Miscarriages / Stillbirths Sister    Breast cancer Paternal Aunt 65   Osteoarthritis Maternal Grandmother    Asthma Maternal Grandmother    Cancer Maternal Grandmother    COPD Maternal Grandmother    Depression Maternal Grandmother    Early death Maternal Grandmother    Osteoarthritis Maternal Grandfather    Asthma Maternal Grandfather    COPD Maternal Grandfather    Hypertension Maternal Grandfather    Hyperlipidemia Maternal Grandfather    Cancer Paternal Grandmother    Diabetes Paternal Grandmother    Diabetes Paternal Grandfather    Hyperlipidemia Paternal  Grandfather    Heart failure Paternal Grandfather    Kidney disease Paternal Grandfather    Alcohol abuse Brother    Hypertension Brother    Hyperlipidemia Other    Heart failure Other    Kidney disease Other     Allergies  Allergen Reactions   Clarithromycin Nausea And Vomiting    Current Outpatient Medications on File Prior to Visit  Medication Sig Dispense Refill   albuterol (VENTOLIN HFA) 108 (90 Base) MCG/ACT inhaler Inhale 1-2 puffs into the lungs every 6 (six) hours as needed for wheezing or shortness of breath. 1 each 0   butalbital-aspirin-caffeine (FIORINAL)  50-325-40 MG capsule Take 1 capsule by mouth every 6 (six) hours as needed for headache. For headache/migraines 20 capsule 0   cetirizine (ZYRTEC) 10 MG tablet Take 1 tablet (10 mg total) by mouth daily. 30 tablet 11   Continuous Glucose Sensor (FREESTYLE LIBRE 3 SENSOR) MISC Use to check blood sugars continuously. Change every 14 days. 6 each 1   hydrOXYzine (ATARAX) 10 MG tablet TAKE 1-2 TABLETS (10-20 MG TOTAL) BY MOUTH 2 (TWO) TIMES DAILY AS NEEDED FOR ANXIETY. 60 tablet 0   letrozole (FEMARA) 2.5 MG tablet Take 2.5 mg by mouth daily.     levothyroxine (SYNTHROID) 25 MCG tablet Take 25 mcg by mouth daily.     metFORMIN (GLUCOPHAGE) 1000 MG tablet TAKE 1 TABLET (1,000 MG TOTAL) BY MOUTH 2 (TWO) TIMES DAILY WITH A MEAL. FOR DIABETES. 180 tablet 1   Multiple Vitamins-Minerals (MULTIVITAMIN WITH MINERALS) tablet Take 1 tablet by mouth daily.     norethindrone (AYGESTIN) 5 MG tablet Take 2.5 mg by mouth 2 (two) times daily.     ondansetron (ZOFRAN-ODT) 4 MG disintegrating tablet Take 1 tablet (4 mg total) by mouth every 8 (eight) hours as needed for nausea or vomiting. 20 tablet 0   sertraline (ZOLOFT) 50 MG tablet Take 1.5 tablets (75 mg total) by mouth daily. For anxiety. 135 tablet 0   WIXELA INHUB 100-50 MCG/ACT AEPB INHALE 1 PUFF INTO THE LUNGS TWICE A DAY 60 each 2   No current facility-administered medications on  file prior to visit.    BP 110/74   Pulse 99   Temp 98.2 F (36.8 C) (Oral)   Ht 5\' 8"  (1.727 m)   Wt 253 lb (114.8 kg)   BMI 38.47 kg/m  Objective:   Physical Exam Cardiovascular:     Rate and Rhythm: Normal rate and regular rhythm.  Pulmonary:     Effort: Pulmonary effort is normal.     Breath sounds: Normal breath sounds.  Musculoskeletal:     Cervical back: Neck supple.  Skin:    General: Skin is warm and dry.  Neurological:     Mental Status: She is alert and oriented to person, place, and time.  Psychiatric:        Mood and Affect: Mood normal.           Assessment & Plan:  Acute back pain with sciatica, left Assessment & Plan: No alarm signs on exam.  Discussed the importance of stretching and walking. Depo-Medrol 80 mg provided intramuscularly today.  Start methocarbamol 500 mg every 8 hours as needed. Start prednisone tablets. Take 3 tablets my mouth once daily x 3 days, then 2 tablets for 3 days, then 1 tablet for 3 days.  She will update.  Orders: -     predniSONE; Take 3 tablets my mouth once daily x 3 days, then 2 tablets for 3 days, then 1 tablet for 3 days.  Dispense: 18 tablet; Refill: 0 -     Methocarbamol; Take 1 tablet (500 mg total) by mouth every 8 (eight) hours as needed for muscle spasms.  Dispense: 15 tablet; Refill: 0  Class 2 severe obesity due to excess calories with serious comorbidity and body mass index (BMI) of 38.0 to 38.9 in adult [facility]) Assessment & Plan: Commended her on weight loss!  Encouraged continued exercise and following with weight watchers.  Agree to increase Mounjaro to 10 mg weekly.  She will update when she has completed her last 7.5 mg dose.  Type 2 diabetes mellitus with hyperglycemia, without long-term current use of insulin [facility]) Assessment & Plan: Well-controlled! Agree to increase Mounjaro to 10 mg weekly. She will notify when she uses her last 7.5 mg dose.         Doreene Nest,  NP

## 2023-09-24 NOTE — Assessment & Plan Note (Addendum)
No alarm signs on exam.  Discussed the importance of stretching and walking. Depo-Medrol 80 mg provided intramuscularly today.  Start methocarbamol 500 mg every 8 hours as needed. Start prednisone tablets. Take 3 tablets my mouth once daily x 3 days, then 2 tablets for 3 days, then 1 tablet for 3 days.  She will update.

## 2023-09-28 DIAGNOSIS — E1165 Type 2 diabetes mellitus with hyperglycemia: Secondary | ICD-10-CM

## 2023-09-28 MED ORDER — TIRZEPATIDE 10 MG/0.5ML ~~LOC~~ SOAJ: 10.0000 mg | SUBCUTANEOUS | 0 refills | Status: DC

## 2023-10-01 ENCOUNTER — Other Ambulatory Visit: Payer: Self-pay | Admitting: Primary Care

## 2023-10-01 DIAGNOSIS — M5442 Lumbago with sciatica, left side: Secondary | ICD-10-CM

## 2023-10-01 MED ORDER — METHOCARBAMOL 500 MG PO TABS: 500.0000 mg | ORAL_TABLET | Freq: Three times a day (TID) | ORAL | 0 refills | Status: DC | PRN

## 2023-10-09 ENCOUNTER — Telehealth: Payer: 59 | Admitting: Physician Assistant

## 2023-10-09 DIAGNOSIS — B379 Candidiasis, unspecified: Secondary | ICD-10-CM | POA: Diagnosis not present

## 2023-10-10 DIAGNOSIS — E1165 Type 2 diabetes mellitus with hyperglycemia: Secondary | ICD-10-CM

## 2023-10-10 MED ORDER — FLUCONAZOLE 150 MG PO TABS: 150.0000 mg | ORAL_TABLET | Freq: Once | ORAL | 0 refills | Status: AC

## 2023-10-10 NOTE — Progress Notes (Signed)
E-Visit for Vaginal Symptoms  We are sorry that you are not feeling well. Here is how we plan to help! Based on what you shared with me it looks like you: May have a yeast vaginosis  Vaginosis is an inflammation of the vagina that can result in discharge, itching and pain. The cause is usually a change in the normal balance of vaginal bacteria or an infection. Vaginosis can also result from reduced estrogen levels after menopause.  The most common causes of vaginosis are:    Yeast infections which are caused by a naturally occurring fungus called candida.   Vaginal atrophy (atrophic vaginosis) which results from the thinning of the vagina from reduced estrogen levels after menopause.   Trichomoniasis which is caused by a parasite and is commonly transmitted by sexual intercourse.  Factors that increase your risk of developing vaginosis include: Medications, such as antibiotics and steroids Uncontrolled diabetes Use of hygiene products such as bubble bath, vaginal spray or vaginal deodorant Douching Wearing damp or tight-fitting clothing Using an intrauterine device (IUD) for birth control Hormonal changes, such as those associated with pregnancy, birth control pills or menopause Sexual activity Having a sexually transmitted infection  Your treatment plan is Diflucan (fluconazole) 150mg  tablet once. If symptoms don't improve in 72 hours you may take a second tablet if needed. I have electronically sent this prescription into the pharmacy that you have chosen.  Be sure to take all of the medication as directed. Stop taking any medication if you develop a rash, tongue swelling or shortness of breath. Mothers who are breast feeding should consider pumping and discarding their breast milk while on these antibiotics. However, there is no consensus that infant exposure at these doses would be harmful.  Remember that medication creams can weaken latex condoms. Marland Kitchen   HOME CARE:  Good hygiene  may prevent some types of vaginosis from recurring and may relieve some symptoms:  Avoid baths, hot tubs and whirlpool spas. Rinse soap from your outer genital area after a shower, and dry the area well to prevent irritation. Don't use scented or harsh soaps, such as those with deodorant or antibacterial action. Avoid irritants. These include scented tampons and pads. Wipe from front to back after using the toilet. Doing so avoids spreading fecal bacteria to your vagina.  Other things that may help prevent vaginosis include:  Don't douche. Your vagina doesn't require cleansing other than normal bathing. Repetitive douching disrupts the normal organisms that reside in the vagina and can actually increase your risk of vaginal infection. Douching won't clear up a vaginal infection. Use a latex condom. Both female and female latex condoms may help you avoid infections spread by sexual contact. Wear cotton underwear. Also wear pantyhose with a cotton crotch. If you feel comfortable without it, skip wearing underwear to bed. Yeast thrives in [employer] Your symptoms should improve in the next day or two.  GET HELP RIGHT AWAY IF:  You have pain in your lower abdomen ( pelvic area or over your ovaries) You develop nausea or vomiting You develop a fever Your discharge changes or worsens You have persistent pain with intercourse You develop shortness of breath, a rapid pulse, or you faint.  These symptoms could be signs of problems or infections that need to be evaluated by a medical provider now.  MAKE SURE YOU   Understand these instructions. Will watch your condition. Will get help right away if you are not doing well or get worse.  Thank you  for choosing an e-visit.  Your e-visit answers were reviewed by a board certified advanced clinical practitioner to complete your personal care plan. Depending upon the condition, your plan could have included both over the counter or prescription  medications.  Please review your pharmacy choice. Make sure the pharmacy is open so you can pick up prescription now. If there is a problem, you may contact your provider through [employer] and have the prescription routed to another pharmacy.  Your safety is important to [place]. If you have drug allergies check your prescription carefully.   For the next 24 hours you can use MyChart to ask questions about today's visit, request a non-urgent call back, or ask for a work or school excuse. You will get an email in the next two days asking about your experience. I hope that your e-visit has been valuable and will speed your recovery.   I have spent 5 minutes in review of e-visit questionnaire, review and updating patient chart, medical decision making and response to patient.   Gilberto Better, PA-C

## 2023-10-10 NOTE — Addendum Note (Signed)
Addended byLaure Kidney on: 10/10/2023 12:29 PM   Modules accepted: Orders

## 2023-10-12 MED ORDER — FREESTYLE LIBRE 3 PLUS SENSOR MISC: 1 refills | Status: DC

## 2023-10-20 ENCOUNTER — Telehealth: Payer: 59

## 2023-10-20 DIAGNOSIS — M5442 Lumbago with sciatica, left side: Secondary | ICD-10-CM | POA: Diagnosis not present

## 2023-10-21 MED ORDER — ETODOLAC 300 MG PO CAPS: 300.0000 mg | ORAL_CAPSULE | Freq: Two times a day (BID) | ORAL | 0 refills | Status: DC

## 2023-10-21 MED ORDER — METHOCARBAMOL 500 MG PO TABS: 500.0000 mg | ORAL_TABLET | Freq: Three times a day (TID) | ORAL | 0 refills | Status: DC | PRN

## 2023-10-21 NOTE — Progress Notes (Signed)
E-Visit for Back Pain   We are sorry that you are not feeling well.  Here is how we plan to help!  Based on what you have shared with me it looks like you mostly have acute back pain.  Acute back pain is defined as musculoskeletal pain that can resolve in 1-3 weeks with conservative treatment.  I have prescribed Etodolac 300 mg take one by mouth twice a day non-steroid anti-inflammatory (NSAID) as well as Methocarbamol 500 mg Take 1 tablet every 8 hours as needed for spasm.  Some patients experience stomach irritation or in increased heartburn with anti-inflammatory drugs.  Please keep in mind that muscle relaxer's can cause fatigue and should not be taken while at work or driving.  Back pain is very common.  The pain often gets better over time.  The cause of back pain is usually not dangerous.  Most people can learn to manage their back pain on their own.  Home Care Stay active.  Start with short walks on flat ground if you can.  Try to walk farther each day. Do not sit, drive or stand in one place for more than 30 minutes.  Do not stay in bed. Do not avoid exercise or work.  Activity can help your back heal faster. Be careful when you bend or lift an object.  Bend at your knees, keep the object close to you, and do not twist. Sleep on a firm mattress.  Lie on your side, and bend your knees.  If you lie on your back, put a pillow under your knees. Only take medicines as told by your doctor. Put ice on the injured area. Put ice in a plastic bag Place a towel between your skin and the bag Leave the ice on for 15-20 minutes, 3-4 times a day for the first 2-3 days. 210 After that, you can switch between ice and heat packs. Ask your doctor about back exercises or massage. Avoid feeling anxious or stressed.  Find good ways to deal with stress, such as exercise.  Get Help Right Way If: Your pain does not go away with rest or medicine. Your pain does not go away in 1 week. You have new  problems. You do not feel well. The pain spreads into your legs. You cannot control when you poop (bowel movement) or pee (urinate) You feel sick to your stomach (nauseous) or throw up (vomit) You have belly (abdominal) pain. You feel like you may pass out (faint). If you develop a fever.  Make Sure you: Understand these instructions. Will watch your condition Will get help right away if you are not doing well or get worse.  Your e-visit answers were reviewed by a board certified advanced clinical practitioner to complete your personal care plan.  Depending on the condition, your plan could have included both over the counter or prescription medications.  If there is a problem please reply  once you have received a response from your provider.  Your safety is important to [place].  If you have drug allergies check your prescription carefully.    You can use MyChart to ask questions about today's visit, request a non-urgent call back, or ask for a work or school excuse for 24 hours related to this e-Visit. If it has been greater than 24 hours you will need to follow up with your provider, or enter a new e-Visit to address those concerns.  You will get an e-mail in the next two days asking about  your experience.  I hope that your e-visit has been valuable and will speed your recovery. Thank you for using e-visits.   I have spent 5 minutes in review of e-visit questionnaire, review and updating patient chart, medical decision making and response to patient.   Margaretann Loveless, PA-C

## 2023-11-04 ENCOUNTER — Other Ambulatory Visit: Payer: Self-pay | Admitting: Primary Care

## 2023-11-04 DIAGNOSIS — F411 Generalized anxiety disorder: Secondary | ICD-10-CM

## 2023-11-04 MED ORDER — HYDROXYZINE HCL 10 MG PO TABS: 10.0000 mg | ORAL_TABLET | Freq: Two times a day (BID) | ORAL | 0 refills | Status: DC | PRN

## 2023-11-07 MED ORDER — FREESTYLE LIBRE 3 SENSOR MISC: 1 refills | Status: AC

## 2023-11-11 ENCOUNTER — Ambulatory Visit: Payer: 59 | Admitting: Cardiology

## 2023-11-14 ENCOUNTER — Other Ambulatory Visit: Payer: Self-pay | Admitting: Primary Care

## 2023-11-14 DIAGNOSIS — F411 Generalized anxiety disorder: Secondary | ICD-10-CM

## 2023-11-19 ENCOUNTER — Telehealth: Payer: 59 | Admitting: Nurse Practitioner

## 2023-11-19 DIAGNOSIS — R112 Nausea with vomiting, unspecified: Secondary | ICD-10-CM

## 2023-11-19 MED ORDER — ONDANSETRON 4 MG PO TBDP: 4.0000 mg | ORAL_TABLET | Freq: Three times a day (TID) | ORAL | 0 refills | Status: DC | PRN

## 2023-11-19 NOTE — Progress Notes (Signed)
 E-Visit for Nausea and Vomiting   We are sorry that you are not feeling well. Here is how we plan to help!  Based on what you have shared with me it looks like you have a Virus that is irritating your GI tract.  Vomiting is the forceful emptying of a portion of the stomach's content through the mouth.  Although nausea and vomiting can make you feel miserable, it's important to remember that these are not diseases, but rather symptoms of an underlying illness.  When we treat short term symptoms, we always caution that any symptoms that persist should be fully evaluated in a medical office.  I have prescribed a medication that will help alleviate your symptoms and allow you to stay hydrated:  Zofran 4 mg 1 tablet every 8 hours as needed for nausea and vomiting  HOME CARE: Drink clear liquids.  This is very important! Dehydration (the lack of fluid) can lead to a serious complication.  Start off with 1 tablespoon every 5 minutes for 8 hours. You may begin eating bland foods after 8 hours without vomiting.  Start with saltine crackers, white bread, rice, mashed potatoes, applesauce. After 48 hours on a bland diet, you may resume a normal diet. Try to go to sleep.  Sleep often empties the stomach and relieves the need to vomit.  GET HELP RIGHT AWAY IF:  Your symptoms do not improve or worsen within 2 days after treatment. You have a fever for over 3 days. You cannot keep down fluids after trying the medication.  MAKE SURE YOU:  Understand these instructions. Will watch your condition. Will get help right away if you are not doing well or get worse.    Thank you for choosing an e-visit.  Your e-visit answers were reviewed by a board certified advanced clinical practitioner to complete your personal care plan. Depending upon the condition, your plan could have included both over the counter or prescription medications.  Please review your pharmacy choice. Make sure the pharmacy is open so  you can pick up prescription now. If there is a problem, you may contact your provider through [employer] and have the prescription routed to another pharmacy.  Your safety is important to [place]. If you have drug allergies check your prescription carefully.   For the next 24 hours you can use MyChart to ask questions about today's visit, request a non-urgent call back, or ask for a work or school excuse. You will get an email in the next two days asking about your experience. I hope that your e-visit has been valuable and will speed your recovery.   Meds ordered this encounter  Medications   ondansetron (ZOFRAN-ODT) 4 MG disintegrating tablet    Sig: Take 1 tablet (4 mg total) by mouth every 8 (eight) hours as needed.    Dispense:  20 tablet    Refill:  0     I spent approximately 5 minutes reviewing the patient's history, current symptoms and coordinating their care today.

## 2023-12-05 ENCOUNTER — Telehealth: Payer: 59 | Admitting: Nurse Practitioner

## 2023-12-05 ENCOUNTER — Other Ambulatory Visit: Payer: Self-pay | Admitting: Nurse Practitioner

## 2023-12-05 DIAGNOSIS — H00014 Hordeolum externum left upper eyelid: Secondary | ICD-10-CM

## 2023-12-05 MED ORDER — NEOMYCIN-POLYMYXIN-HC 3.5-10000-1 OP SUSP: 3.0000 [drp] | Freq: Four times a day (QID) | OPHTHALMIC | 0 refills | Status: DC

## 2023-12-05 NOTE — Progress Notes (Signed)
 I have spent 5 minutes in review of e-visit questionnaire, review and updating patient chart, medical decision making and response to patient.   Claiborne Rigg, NP

## 2023-12-05 NOTE — Progress Notes (Signed)
  E-Visit for Stye   We are sorry that you are not feeling well. Here is how we plan to help!  Based on what you have shared with me it looks like you have a stye.  A stye is an inflammation of the eyelid.  It is often a red, painful lump near the edge of the eyelid that may look like a boil or a pimple.  A stye develops when an infection occurs at the base of an eyelash.   We have made appropriate suggestions for you based upon your presentation: Your symptoms may indicate an infection of the sclera.  The use of anti-inflammatory and antibiotic eye drops for a week will help resolve this condition.  I have sent in neomycin-polymyxin HC opthalmic suspension, two to three drops in the affected eye every 4 hours.  If your symptoms do not improve over the next two to three days you should be seen in your doctor's office.  HOME CARE:  Wash your hands often! Let the stye open on its own. Don't squeeze or open it. Don't rub your eyes. This can irritate your eyes and let in bacteria.  If you need to touch your eyes, wash your hands first. Don't wear eye makeup or contact lenses until the area has healed.  GET HELP RIGHT AWAY IF:  Your symptoms do not improve. You develop blurred or loss of vision. Your symptoms worsen (increased discharge, pain or redness).   Thank you for choosing an e-visit.  Your e-visit answers were reviewed by a board certified advanced clinical practitioner to complete your personal care plan. Depending upon the condition, your plan could have included both over the counter or prescription medications.  Please review your pharmacy choice. Make sure the pharmacy is open so you can pick up prescription now. If there is a problem, you may contact your provider through [employer] and have the prescription routed to another pharmacy.  Your safety is important to [place]. If you have drug allergies check your prescription carefully.   For the next 24 hours you can use  MyChart to ask questions about today's visit, request a non-urgent call back, or ask for a work or school excuse. You will get an email in the next two days asking about your experience. I hope that your e-visit has been valuable and will speed your recovery.

## 2023-12-12 ENCOUNTER — Telehealth: Payer: 59 | Admitting: Physician Assistant

## 2023-12-12 DIAGNOSIS — B3731 Acute candidiasis of vulva and vagina: Secondary | ICD-10-CM

## 2023-12-12 MED ORDER — FLUCONAZOLE 150 MG PO TABS: 150.0000 mg | ORAL_TABLET | Freq: Every day | ORAL | 0 refills | Status: AC

## 2023-12-12 NOTE — Progress Notes (Signed)

## 2023-12-21 ENCOUNTER — Other Ambulatory Visit: Payer: Self-pay | Admitting: Primary Care

## 2023-12-21 DIAGNOSIS — E1165 Type 2 diabetes mellitus with hyperglycemia: Secondary | ICD-10-CM

## 2023-12-21 MED ORDER — TIRZEPATIDE 10 MG/0.5ML ~~LOC~~ SOAJ: 10.0000 mg | SUBCUTANEOUS | 0 refills | Status: DC

## 2023-12-23 ENCOUNTER — Other Ambulatory Visit: Payer: Self-pay | Admitting: Primary Care

## 2023-12-23 DIAGNOSIS — F411 Generalized anxiety disorder: Secondary | ICD-10-CM

## 2023-12-28 ENCOUNTER — Telehealth: Payer: 59 | Admitting: Physician Assistant

## 2023-12-28 DIAGNOSIS — A084 Viral intestinal infection, unspecified: Secondary | ICD-10-CM | POA: Diagnosis not present

## 2023-12-29 MED ORDER — ONDANSETRON 8 MG PO TBDP: 8.0000 mg | ORAL_TABLET | Freq: Three times a day (TID) | ORAL | 0 refills | Status: DC | PRN

## 2023-12-29 NOTE — Progress Notes (Signed)
I have spent 5 minutes in review of e-visit questionnaire, review and updating patient chart, medical decision making and response to patient.   Piedad Climes, PA-C

## 2023-12-29 NOTE — Progress Notes (Signed)
E-Visit for Vomiting  We are sorry that you are not feeling well. Here is how we plan to help!  Based on what you have shared with me it looks like you have a Virus that is irritating your GI tract.  Vomiting is the forceful emptying of a portion of the stomach's content through the mouth.  Although nausea and vomiting can make you feel miserable, it's important to remember that these are not diseases, but rather symptoms of an underlying illness.  When we treat short term symptoms, we always caution that any symptoms that persist should be fully evaluated in a medical office.  I have prescribed a medication that will help alleviate your symptoms and allow you to stay hydrated:  Zofran 8 mg 1 tablet every 8 hours as needed for nausea and vomiting  HOME CARE: Drink clear liquids.  This is very important! Dehydration (the lack of fluid) can lead to a serious complication.  Start off with 1 tablespoon every 5 minutes for 8 hours. You may begin eating bland foods after 8 hours without vomiting.  Start with saltine crackers, white bread, rice, mashed potatoes, applesauce. After 48 hours on a bland diet, you may resume a normal diet. Try to go to sleep.  Sleep often empties the stomach and relieves the need to vomit.  GET HELP RIGHT AWAY IF:  Your symptoms do not improve or worsen within 2 days after treatment. You have a fever for over 3 days. You cannot keep down fluids after trying the medication.  MAKE SURE YOU:  Understand these instructions. Will watch your condition. Will get help right away if you are not doing well or get worse.   Thank you for choosing an e-visit.  Your e-visit answers were reviewed by a board certified advanced clinical practitioner to complete your personal care plan. Depending upon the condition, your plan could have included both over the counter or prescription medications.  Please review your pharmacy choice. Make sure the pharmacy is open so you can pick  up prescription now. If there is a problem, you may contact your provider through [employer] and have the prescription routed to another pharmacy.  Your safety is important to [place]. If you have drug allergies check your prescription carefully.   For the next 24 hours you can use MyChart to ask questions about today's visit, request a non-urgent call back, or ask for a work or school excuse. You will get an email in the next two days asking about your experience. I hope that your e-visit has been valuable and will speed your recovery.

## 2023-12-30 ENCOUNTER — Encounter: Payer: Self-pay | Admitting: Cardiology

## 2023-12-30 ENCOUNTER — Ambulatory Visit: Payer: 59 | Admitting: Cardiology

## 2024-01-01 ENCOUNTER — Other Ambulatory Visit: Payer: Self-pay | Admitting: Primary Care

## 2024-01-01 DIAGNOSIS — F411 Generalized anxiety disorder: Secondary | ICD-10-CM

## 2024-01-04 ENCOUNTER — Telehealth: Payer: 59 | Admitting: Physician Assistant

## 2024-01-04 DIAGNOSIS — H00016 Hordeolum externum left eye, unspecified eyelid: Secondary | ICD-10-CM

## 2024-01-04 MED ORDER — ERYTHROMYCIN 5 MG/GM OP OINT: 1.0000 | TOPICAL_OINTMENT | Freq: Every day | OPHTHALMIC | 0 refills | Status: DC

## 2024-01-04 NOTE — Progress Notes (Signed)
  E-Visit for Stye   We are sorry that you are not feeling well. Here is how we plan to help!  Based on what you have shared with me it looks like you have a stye.  A stye is an inflammation of the eyelid.  It is often a red, painful lump near the edge of the eyelid that may look like a boil or a pimple.  A stye develops when an infection occurs at the base of an eyelash.   We have made appropriate suggestions for you based upon your presentation: Simple styes can be treated without medical intervention.  Most styes either resolve spontaneously or resolve with simple home treatment by applying warm compresses or heated washcloth to the stye for about 10-15 minutes three to four times a day. This causes the stye to drain and resolve.  I have prescribed Erythromycin ointment. Apply to both eyelids at bedtime for 5 nights.  HOME CARE:  Wash your hands often! Let the stye open on its own. Don't squeeze or open it. Don't rub your eyes. This can irritate your eyes and let in bacteria.  If you need to touch your eyes, wash your hands first. Don't wear eye makeup or contact lenses until the area has healed.  GET HELP RIGHT AWAY IF:  Your symptoms do not improve. You develop blurred or loss of vision. Your symptoms worsen (increased discharge, pain or redness).   Thank you for choosing an e-visit.  Your e-visit answers were reviewed by a board certified advanced clinical practitioner to complete your personal care plan. Depending upon the condition, your plan could have included both over the counter or prescription medications.  Please review your pharmacy choice. Make sure the pharmacy is open so you can pick up prescription now. If there is a problem, you may contact your provider through [employer] and have the prescription routed to another pharmacy.  Your safety is important to [place]. If you have drug allergies check your prescription carefully.   For the next 24 hours you can use  MyChart to ask questions about today's visit, request a non-urgent call back, or ask for a work or school excuse. You will get an email in the next two days asking about your experience. I hope that your e-visit has been valuable and will speed your recovery.   I have spent 5 minutes in review of e-visit questionnaire, review and updating patient chart, medical decision making and response to patient.   Margaretann Loveless, PA-C

## 2024-01-21 ENCOUNTER — Ambulatory Visit: Payer: 59 | Admitting: Primary Care

## 2024-01-25 ENCOUNTER — Other Ambulatory Visit: Payer: Self-pay | Admitting: Primary Care

## 2024-01-25 ENCOUNTER — Ambulatory Visit: Payer: 59 | Admitting: Primary Care

## 2024-01-25 ENCOUNTER — Encounter: Payer: Self-pay | Admitting: Primary Care

## 2024-01-25 VITALS — BP 116/80 | HR 90 | Temp 96.6°F | Ht 68.0 in | Wt 222.0 lb

## 2024-01-25 DIAGNOSIS — Z7984 Long term (current) use of oral hypoglycemic drugs: Secondary | ICD-10-CM

## 2024-01-25 DIAGNOSIS — E6609 Other obesity due to excess calories: Secondary | ICD-10-CM | POA: Diagnosis not present

## 2024-01-25 DIAGNOSIS — E1165 Type 2 diabetes mellitus with hyperglycemia: Secondary | ICD-10-CM

## 2024-01-25 DIAGNOSIS — Z6833 Body mass index (BMI) 33.0-33.9, adult: Secondary | ICD-10-CM

## 2024-01-25 DIAGNOSIS — E119 Type 2 diabetes mellitus without complications: Secondary | ICD-10-CM

## 2024-01-25 DIAGNOSIS — F411 Generalized anxiety disorder: Secondary | ICD-10-CM | POA: Diagnosis not present

## 2024-01-25 DIAGNOSIS — Z7985 Long-term (current) use of injectable non-insulin antidiabetic drugs: Secondary | ICD-10-CM

## 2024-01-25 DIAGNOSIS — E66811 Obesity, class 1: Secondary | ICD-10-CM | POA: Diagnosis not present

## 2024-01-25 DIAGNOSIS — D72829 Elevated white blood cell count, unspecified: Secondary | ICD-10-CM

## 2024-01-25 LAB — BASIC METABOLIC PANEL
BUN: 10 mg/dL (ref 6–23)
CO2: 24 meq/L (ref 19–32)
Calcium: 9.2 mg/dL (ref 8.4–10.5)
Chloride: 102 meq/L (ref 96–112)
Creatinine, Ser: 0.94 mg/dL (ref 0.40–1.20)
GFR: 72.17 mL/min (ref >60.00)
Glucose, Bld: 144 mg/dL — ABNORMAL HIGH (ref 70–99)
Potassium: 4 meq/L (ref 3.5–5.1)
Sodium: 140 meq/L (ref 135–145)

## 2024-01-25 LAB — CBC
HCT: 47.1 % — ABNORMAL HIGH (ref 36.0–46.0)
Hemoglobin: 15.9 g/dL — ABNORMAL HIGH (ref 12.0–15.0)
MCHC: 33.6 g/dL (ref 30.0–36.0)
MCV: 87.1 fl (ref 78.0–100.0)
Platelets: 277 10*3/uL (ref 150.0–400.0)
RBC: 5.41 Mil/uL — ABNORMAL HIGH (ref 3.87–5.11)
RDW: 13.7 % (ref 11.5–15.5)
WBC: 10.7 10*3/uL — ABNORMAL HIGH (ref 4.0–10.5)

## 2024-01-25 LAB — POCT GLYCOSYLATED HEMOGLOBIN (HGB A1C): Hemoglobin A1C: 5.4 % (ref 4.0–5.6)

## 2024-01-25 LAB — VITAMIN D 25 HYDROXY (VIT D DEFICIENCY, FRACTURES): VITD: 24.32 ng/mL — ABNORMAL LOW (ref 30.00–100.00)

## 2024-01-25 LAB — VITAMIN B12: Vitamin B-12: 259 pg/mL (ref 211–911)

## 2024-01-25 MED ORDER — METFORMIN HCL 1000 MG PO TABS: 500.0000 mg | ORAL_TABLET | Freq: Two times a day (BID) | ORAL | Status: DC

## 2024-01-25 MED ORDER — SERTRALINE HCL 100 MG PO TABS: 100.0000 mg | ORAL_TABLET | Freq: Every day | ORAL | 0 refills | Status: DC

## 2024-01-25 NOTE — Assessment & Plan Note (Signed)
 Deteriorated.  Increase Zoloft to 100 mg daily for now. New Rx sent to pharmacy. Continue hydroxyzine 10 mg PRN.  Follow up in 3 months

## 2024-01-25 NOTE — Assessment & Plan Note (Signed)
 Well controlled and improved with A1C today of 5.4!  Reduce metformin to 500 mg BID. Continue Mounjaro 10 mg weekly.  Follow up in 3 months.

## 2024-01-25 NOTE — Progress Notes (Signed)
 Subjective:    Patient ID: Kelli Cox, female    DOB: January 30, 1976, 48 y.o.   MRN: [id number]  HPI  Kelli Cox is a very pleasant 48 y.o. female with a history of type 2 diabetes, asthma, migraines, GAD who presents today for follow-up diabetes and to discuss several concerns.   1) Type 2 Diabetes: Current medications include: Metformin 1000 mg twice daily, Mounjaro 10 mg weekly.  She is checking her blood glucose continuously and is in range 76% of the time.   Last A1C: 6.2 in August 2024, Last Eye Exam: Up-to-date Last Foot Exam: Up-to-date Pneumonia Vaccination: 2018 Urine Microalbumin: Up-to-date Statin: None.  Dietary changes since last visit: Smaller portion sizes, less frequent snacking, increased protein and veggies.    Exercise: None.  She has noticed intermittent lightheadedness, mostly with positional changes. She is hydrating well with water daily, has been drinking powerade zero for electrolyte replacement.   Body mass index is 33.75 kg/m.   BP Readings from Last 3 Encounters:  01/25/24 116/80  09/24/23 110/74  07/21/23 130/70   Wt Readings from Last 3 Encounters:  01/25/24 222 lb (100.7 kg)  09/24/23 253 lb (114.8 kg)  07/21/23 268 lb (121.6 kg)   2) GAD: Chronic. Deteriorated over the last few months. Increased stress with financial issues and family stress. She is using the hydroxyzine every night due to symptoms. Previously managed on Zoloft 100 mg, reduced down to 50 mg several months ago.      Review of Systems  Respiratory:  Negative for shortness of breath.   Cardiovascular:  Negative for chest pain.  Neurological:  Positive for light-headedness. Negative for numbness.  Psychiatric/Behavioral:  The patient is nervous/anxious.          Past Medical History:  Diagnosis Date   Acute non-recurrent sinusitis 10/17/2020   Asthma    Chicken pox    Concussion with no loss of consciousness 08/27/2021   GAD (generalized anxiety disorder)     Leg pain, lateral, left 07/31/2022   Migraines    Mild intermittent asthma with (acute) exacerbation 10/31/2022   Pain of upper abdomen 01/23/2021   Shortness of breath 11/18/2022   Type 2 diabetes mellitus (HCC)     Social History   Socioeconomic History   Marital status: Single    Spouse name: Not on file   Number of children: Not on file   Years of education: Not on file   Highest education level: Master's degree (e.g., MA, MS, MEng, MEd, MSW, MBA)  Occupational History   Occupation: Emergency planning/management officer  Tobacco Use   Smoking status: Never   Smokeless tobacco: Never  Vaping Use   Vaping status: Never Used  Substance and Sexual Activity   Alcohol use: Yes    Comment: 1 drink every two weeks    Drug use: Never   Sexual activity: Yes    Partners: Male    Birth control/protection: None  Other Topics Concern   Not on file  Social History Narrative   Not on file   Social Drivers of Health   Financial Resource Strain: Medium Risk (01/22/2024)   Overall Financial Resource Strain (CARDIA)    Difficulty of Paying Living Expenses: Somewhat hard  Food Insecurity: No Food Insecurity (01/22/2024)   Hunger Vital Sign    Worried About Running Out of Food in the Last Year: Never true    Ran Out of Food in the Last Year: Never true  Transportation Needs: No Transportation Needs (  01/22/2024)   PRAPARE - Administrator, Civil Service (Medical): No    Lack of Transportation (Non-Medical): No  Physical Activity: Insufficiently Active (01/22/2024)   Exercise Vital Sign    Days of Exercise per Week: 3 days    Minutes of Exercise per Session: 20 min  Stress: Stress Concern Present (01/22/2024)   [employer] of Occupational Health - Occupational Stress Questionnaire    Feeling of Stress : To some extent  Social Connections: Moderately Integrated (01/22/2024)   Social Connection and Isolation Panel [NHANES]    Frequency of Communication with Friends and Family: More than three  times a week    Frequency of Social Gatherings with Friends and Family: Once a week    Attends Religious Services: 1 to 4 times per year    Active Member of [employer] or Organizations: No    Attends Banker Meetings: Not on file    Marital Status: Living with partner  Intimate Partner Violence: Not on file    Past Surgical History:  Procedure Laterality Date   CHOLECYSTECTOMY  2008   COLONOSCOPY WITH PROPOFOL N/A 07/05/2021   Procedure: COLONOSCOPY WITH PROPOFOL;  Surgeon: Wyline Mood, MD;  Location: [facility] ENDOSCOPY;  Service: Gastroenterology;  Laterality: N/A;   LIPOMA RESECTION  2012   WRIST SURGERY Left    2001 and 2007    Family History  Problem Relation Age of Onset   Arthritis Mother    Asthma Mother    Depression Mother    Hyperlipidemia Mother    Hypertension Mother    Kidney disease Mother    Cancer Father    Diabetes Father    Early death Father    Hyperlipidemia Father    Hypertension Father    Heart failure Father    Asthma Sister    Hypertension Sister    Miscarriages / Stillbirths Sister    Breast cancer Paternal Aunt 62   Osteoarthritis Maternal Grandmother    Asthma Maternal Grandmother    Cancer Maternal Grandmother    COPD Maternal Grandmother    Depression Maternal Grandmother    Early death Maternal Grandmother    Osteoarthritis Maternal Grandfather    Asthma Maternal Grandfather    COPD Maternal Grandfather    Hypertension Maternal Grandfather    Hyperlipidemia Maternal Grandfather    Cancer Paternal Grandmother    Diabetes Paternal Grandmother    Diabetes Paternal Grandfather    Hyperlipidemia Paternal Grandfather    Heart failure Paternal Grandfather    Kidney disease Paternal Grandfather    Alcohol abuse Brother    Hypertension Brother    Hyperlipidemia Other    Heart failure Other    Kidney disease Other     Allergies  Allergen Reactions   Clarithromycin Nausea And Vomiting    Current Outpatient Medications on File  Prior to Visit  Medication Sig Dispense Refill   albuterol (VENTOLIN HFA) 108 (90 Base) MCG/ACT inhaler Inhale 1-2 puffs into the lungs every 6 (six) hours as needed for wheezing or shortness of breath. 1 each 0   butalbital-aspirin-caffeine (FIORINAL) 50-325-40 MG capsule Take 1 capsule by mouth every 6 (six) hours as needed for headache. For headache/migraines 20 capsule 0   cetirizine (ZYRTEC) 10 MG tablet Take 1 tablet (10 mg total) by mouth daily. 30 tablet 11   Continuous Glucose Sensor (FREESTYLE LIBRE 3 SENSOR) MISC Place 1 sensor on the skin every 14 days. Use to check glucose continuously 6 each 1   hydrOXYzine (ATARAX)  10 MG tablet TAKE 1-2 TABLETS (10-20 MG TOTAL) BY MOUTH 2 (TWO) TIMES DAILY AS NEEDED FOR ANXIETY. 60 tablet 0   letrozole (FEMARA) 2.5 MG tablet Take 2.5 mg by mouth daily.     levothyroxine (SYNTHROID) 25 MCG tablet Take 25 mcg by mouth daily.     Multiple Vitamins-Minerals (MULTIVITAMIN WITH MINERALS) tablet Take 1 tablet by mouth daily.     norethindrone (AYGESTIN) 5 MG tablet Take 2.5 mg by mouth 2 (two) times daily.     ondansetron (ZOFRAN-ODT) 8 MG disintegrating tablet Take 1 tablet (8 mg total) by mouth every 8 (eight) hours as needed for nausea or vomiting. 20 tablet 0   tirzepatide (MOUNJARO) 10 MG/0.5ML Pen Inject 10 mg into the skin once a week. for diabetes. 6 mL 0   tiZANidine (ZANAFLEX) 4 MG tablet Take 4 mg by mouth 3 (three) times daily as needed.     WIXELA INHUB 100-50 MCG/ACT AEPB INHALE 1 PUFF INTO THE LUNGS TWICE A DAY 60 each 2   erythromycin ophthalmic ointment Place 1 Application into the left eye at bedtime. (Patient not taking: Reported on 01/25/2024) 5 g 0   No current facility-administered medications on file prior to visit.    BP 116/80   Pulse 90   Temp (!) 96.6 F (35.9 C) (Temporal)   Ht 5\' 8"  (1.727 m)   Wt 222 lb (100.7 kg)   LMP  (LMP Unknown)   SpO2 98%   BMI 33.75 kg/m  Objective:   Physical Exam Cardiovascular:     Rate  and Rhythm: Normal rate and regular rhythm.  Pulmonary:     Effort: Pulmonary effort is normal.     Breath sounds: Normal breath sounds.  Musculoskeletal:     Cervical back: Neck supple.  Skin:    General: Skin is warm and dry.  Neurological:     Mental Status: She is alert and oriented to person, place, and time.  Psychiatric:        Mood and Affect: Mood normal.           Assessment & Plan:  Type 2 diabetes mellitus with hyperglycemia, without long-term current use of insulin (HCC) Assessment & Plan: Well controlled and improved with A1C today of 5.4!  Reduce metformin to 500 mg BID. Continue Mounjaro 10 mg weekly.  Follow up in 3 months.  Orders: -     POCT glycosylated hemoglobin (Hb A1C) -     Basic metabolic panel -     Vitamin B12 -     VITAMIN D 25 Hydroxy (Vit-D Deficiency, Fractures) -     CBC  GAD (generalized anxiety disorder) Assessment & Plan: Deteriorated.  Increase Zoloft to 100 mg daily for now. New Rx sent to pharmacy. Continue hydroxyzine 10 mg PRN.  Follow up in 3 months   Orders: -     Sertraline HCl; Take 1 tablet (100 mg total) by mouth daily. For anxiety  Dispense: 90 tablet; Refill: 0  Class 1 obesity due to excess calories without serious comorbidity with body mass index (BMI) of 33.0 to 33.9 in adult Assessment & Plan: Commended her on weight loss, encouraged to continue.  Continue Mounjaro 10 mg weekly. Labs pending today including B12, D, CBC, BMP.  Orders: -     Basic metabolic panel -     Vitamin B12 -     VITAMIN D 25 Hydroxy (Vit-D Deficiency, Fractures) -     CBC  Diabetes mellitus without complication (HCC) -  metFORMIN HCl; Take 0.5 tablets (500 mg total) by mouth 2 (two) times daily with a meal. For diabetes.        Doreene Nest, NP

## 2024-01-25 NOTE — Patient Instructions (Signed)
 We increased the dose of your Zoloft to 100 mg daily.  I sent a new prescription to your pharmacy.  Reduce your metformin to 500 mg twice daily.  Stop by the lab prior to leaving today. I will notify you of your results once received.   Please schedule a physical to meet with me in 3 months.   It was a pleasure to see you today!

## 2024-01-25 NOTE — Assessment & Plan Note (Signed)
 Commended her on weight loss, encouraged to continue.  Continue Mounjaro 10 mg weekly. Labs pending today including B12, D, CBC, BMP.

## 2024-01-26 ENCOUNTER — Ambulatory Visit (INDEPENDENT_AMBULATORY_CARE_PROVIDER_SITE_OTHER): Payer: 59

## 2024-01-26 DIAGNOSIS — D72829 Elevated white blood cell count, unspecified: Secondary | ICD-10-CM

## 2024-01-26 LAB — WHITE CELL DIFFERENTIAL
Band Neutrophils: 0 %
Basophils Relative: 1.3 % (ref 0.0–3.0)
Eosinophils Relative: 2.5 % (ref 0.0–5.0)
Lymphocytes Relative: 32.8 % (ref 12.0–46.0)
Monocytes Relative: 5.7 % (ref 3.0–12.0)
Neutrophils Relative %: 57.7 % (ref 43.0–77.0)

## 2024-01-28 ENCOUNTER — Other Ambulatory Visit: Payer: Self-pay | Admitting: Primary Care

## 2024-01-28 DIAGNOSIS — R7989 Other specified abnormal findings of blood chemistry: Secondary | ICD-10-CM

## 2024-02-04 ENCOUNTER — Other Ambulatory Visit: Payer: Self-pay | Admitting: Primary Care

## 2024-02-04 DIAGNOSIS — F411 Generalized anxiety disorder: Secondary | ICD-10-CM

## 2024-02-15 ENCOUNTER — Telehealth: Admitting: Nurse Practitioner

## 2024-02-15 DIAGNOSIS — R112 Nausea with vomiting, unspecified: Secondary | ICD-10-CM

## 2024-02-15 NOTE — Progress Notes (Signed)
 Kelli Cox,  On review of your chart we have prescribed anti-nausea medicine 6 times in the past year. This flags Kelli Cox as a recurrent issue. Though this may be from a contagious virus, we would like you to be seen by your primary care provider so that she can review the chronic recurrent nature of your nausea and vomiting   Thank you  Because of the recurrent nature of this complaint, I feel your condition warrants further evaluation and I recommend that you be seen for a face to face visit.  Please contact your primary care physician practice to be seen. Many offices offer virtual options to be seen via video if you are not comfortable going in person to a medical facility at this time.  NOTE: You will NOT be charged for this eVisit.  If you do not have a PCP, [facility] offers a free physician referral service available at [phone]. Our trained staff has the experience, knowledge and resources to put you in touch with a physician who is right for you.    If you are having a true medical emergency please call 911.   Your e-visit answers were reviewed by a board certified advanced clinical practitioner to complete your personal care plan.  Thank you for using e-Visits.

## 2024-02-16 ENCOUNTER — Other Ambulatory Visit

## 2024-02-16 DIAGNOSIS — R7989 Other specified abnormal findings of blood chemistry: Secondary | ICD-10-CM

## 2024-02-16 LAB — CBC WITH DIFFERENTIAL/PLATELET
Basophils Absolute: 0.1 10*3/uL (ref 0.0–0.1)
Basophils Relative: 0.8 % (ref 0.0–3.0)
Eosinophils Absolute: 0.2 10*3/uL (ref 0.0–0.7)
Eosinophils Relative: 2.6 % (ref 0.0–5.0)
HCT: 45 % (ref 36.0–46.0)
Hemoglobin: 15.2 g/dL — ABNORMAL HIGH (ref 12.0–15.0)
Lymphocytes Relative: 35.8 % (ref 12.0–46.0)
Lymphs Abs: 3.5 10*3/uL (ref 0.7–4.0)
MCHC: 33.7 g/dL (ref 30.0–36.0)
MCV: 87.5 fl (ref 78.0–100.0)
Monocytes Absolute: 0.6 10*3/uL (ref 0.1–1.0)
Monocytes Relative: 6.6 % (ref 3.0–12.0)
Neutro Abs: 5.2 10*3/uL (ref 1.4–7.7)
Neutrophils Relative %: 54.2 % (ref 43.0–77.0)
Platelets: 279 10*3/uL (ref 150.0–400.0)
RBC: 5.14 Mil/uL — ABNORMAL HIGH (ref 3.87–5.11)
RDW: 14.2 % (ref 11.5–15.5)
WBC: 9.7 10*3/uL (ref 4.0–10.5)

## 2024-02-16 LAB — IBC + FERRITIN
Ferritin: 79.5 ng/mL (ref 10.0–291.0)
Iron: 55 ug/dL (ref 42–145)
Saturation Ratios: 16.1 % — ABNORMAL LOW (ref 20.0–50.0)
TIBC: 341.6 ug/dL (ref 250.0–450.0)
Transferrin: 244 mg/dL (ref 212.0–360.0)

## 2024-02-26 ENCOUNTER — Other Ambulatory Visit: Payer: Self-pay | Admitting: Nurse Practitioner

## 2024-02-26 DIAGNOSIS — F411 Generalized anxiety disorder: Secondary | ICD-10-CM

## 2024-03-02 ENCOUNTER — Telehealth: Payer: Self-pay

## 2024-03-02 ENCOUNTER — Other Ambulatory Visit (HOSPITAL_COMMUNITY): Payer: Self-pay

## 2024-03-02 ENCOUNTER — Other Ambulatory Visit: Payer: Self-pay

## 2024-03-02 NOTE — Telephone Encounter (Signed)
 Pharmacy Patient Advocate Encounter   Received notification from CoverMyMeds that prior authorization for FreeStyle Libre 3 Plus Sensor is required/requested.   Insurance verification completed.   The patient is insured through [employer] .   Per test claim: PA required; PA submitted to above mentioned insurance via CoverMyMeds Key/confirmation #/EOC [id number] Status is pending

## 2024-03-02 NOTE — Telephone Encounter (Signed)
 Pharmacy Patient Advocate Encounter  Received notification from [employer] that Prior Authorization for FreeStyle Libre 3 Plus Sensor has been APPROVED from 02/01/24 to 11/27/24. Ran test claim, Copay is $39.99. This test claim was processed through [facility]- copay amounts may vary at other pharmacies due to pharmacy/plan contracts, or as the patient moves through the different stages of their insurance plan.   PA #/Case ID/Reference #: [id number]

## 2024-03-21 DIAGNOSIS — E1165 Type 2 diabetes mellitus with hyperglycemia: Secondary | ICD-10-CM

## 2024-03-22 MED ORDER — TIRZEPATIDE 12.5 MG/0.5ML ~~LOC~~ SOAJ: 12.5000 mg | SUBCUTANEOUS | 0 refills | Status: DC

## 2024-03-25 ENCOUNTER — Ambulatory Visit: Payer: Self-pay

## 2024-03-25 ENCOUNTER — Encounter: Admitting: Physician Assistant

## 2024-03-25 ENCOUNTER — Ambulatory Visit
Admission: RE | Admit: 2024-03-25 | Discharge: 2024-03-25 | Disposition: A | Discharge status: Home / Self Care | Source: Ambulatory Visit | Attending: Emergency Medicine | Admitting: Emergency Medicine

## 2024-03-25 VITALS — BP 120/79 | HR 79 | Temp 98.7°F | Resp 18

## 2024-03-25 DIAGNOSIS — M5442 Lumbago with sciatica, left side: Secondary | ICD-10-CM

## 2024-03-25 MED ORDER — PREDNISONE 10 MG (21) PO TBPK: ORAL_TABLET | Freq: Every day | ORAL | 0 refills | Status: DC

## 2024-03-25 MED ORDER — KETOROLAC TROMETHAMINE 30 MG/ML IJ SOLN
30.0000 mg | Freq: Once | INTRAMUSCULAR | Status: AC
  Administered 2024-03-25: 30 mg via INTRAMUSCULAR

## 2024-03-25 NOTE — ED Provider Notes (Signed)
 Kelli Cox    CSN: [id number] Arrival date & time: 03/25/24  1241      History   Chief Complaint Chief Complaint  Patient presents with   Back Pain    Sciatic flaring and wrapping around to the front - Entered by patient    HPI Kelli Cox is a 48 y.o. female.   Patient presents for evaluation of left lower sided back pain radiating into the lower extremity with associated numbness and tingling beginning 1 day ago.  Endorses that she was attempting to help her dog while it was having a seizure and bent down awkwardly, pain begins shortly after radiating into the left flank.  Symptoms resolved at rest but are exacerbated with periods of standing, changing position and all movement.  Denies urinary or bowel incontinence.  Has attempted use of ibuprofen and Tylenol .  History of prior back pain.  Past Medical History:  Diagnosis Date   Acute non-recurrent sinusitis 10/17/2020   Asthma    Chicken pox    Concussion with no loss of consciousness 08/27/2021   GAD (generalized anxiety disorder)    Leg pain, lateral, left 07/31/2022   Migraines    Mild intermittent asthma with (acute) exacerbation 10/31/2022   Pain of upper abdomen 01/23/2021   Shortness of breath 11/18/2022   Type 2 diabetes mellitus [facility])     Patient Active Problem List   Diagnosis Date Noted   Class 1 obesity due to excess calories with body mass index (BMI) of 33.0 to 33.9 in adult 07/01/2023   Acute pain of left shoulder 06/19/2023   Preventative health care 04/21/2023   Skin lesions 10/02/2022   Acute back pain with sciatica, left 07/31/2022   Female infertility 06/19/2022   Family history of ovarian cancer 05/29/2021   Family history of breast cancer 05/29/2021   Type 2 diabetes mellitus with hyperglycemia (HCC) 10/01/2020   Migraines 10/01/2020   Asthma 10/01/2020   GAD (generalized anxiety disorder) 10/01/2020    Past Surgical History:  Procedure Laterality Date   CHOLECYSTECTOMY   2008   COLONOSCOPY WITH PROPOFOL  N/A 07/05/2021   Procedure: COLONOSCOPY WITH PROPOFOL ;  Surgeon: Luke Salaam, MD;  Location: [facility] ENDOSCOPY;  Service: Gastroenterology;  Laterality: N/A;   LIPOMA RESECTION  2012   WRIST SURGERY Left    2001 and 2007    OB History     Gravida  1   Para      Term      Preterm      AB  1   Living         SAB  1   IAB      Ectopic      Multiple      Live Births               Home Medications    Prior to Admission medications   Medication Sig Start Date End Date Taking? Authorizing Provider  albuterol  (VENTOLIN  HFA) 108 (90 Base) MCG/ACT inhaler Inhale 1-2 puffs into the lungs every 6 (six) hours as needed for wheezing or shortness of breath. 12/15/22  Yes Clark, Katherine K, NP  butalbital -aspirin -caffeine  (FIORINAL ) 50-325-40 MG capsule Take 1 capsule by mouth every 6 (six) hours as needed for headache. For headache/migraines 05/01/23  Yes Gabriel John, NP  cetirizine  (ZYRTEC ) 10 MG tablet Take 1 tablet (10 mg total) by mouth daily. 10/10/20  Yes Hawks, Libbie Redwood, FNP  Continuous Glucose Sensor (FREESTYLE LIBRE 3 SENSOR) MISC Place  1 sensor on the skin every 14 days. Use to check glucose continuously 11/07/23  Yes Clark, Katherine K, NP  metFORMIN  (GLUCOPHAGE ) 1000 MG tablet Take 0.5 tablets (500 mg total) by mouth 2 (two) times daily with a meal. For diabetes. 01/25/24  Yes Clark, Katherine K, NP  Multiple Vitamins-Minerals (MULTIVITAMIN WITH MINERALS) tablet Take 1 tablet by mouth daily.   Yes [provider]  predniSONE  (STERAPRED UNI-PAK 21 TAB) 10 MG (21) TBPK tablet Take by mouth daily. Take 6 tabs by mouth daily  for 2 days, then 5 tabs for 2 days, then 4 tabs for 2 days, then 3 tabs for 2 days, 2 tabs for 2 days, then 1 tab by mouth daily for 2 days 03/25/24  Yes Horatio Bertz R, NP  sertraline  (ZOLOFT ) 100 MG tablet Take 1 tablet (100 mg total) by mouth daily. For anxiety 01/25/24  Yes Clark, Katherine K, NP   tirzepatide  (MOUNJARO ) 12.5 MG/0.5ML Pen Inject 12.5 mg into the skin once a week. for diabetes. 03/22/24  Yes Clark, Katherine K, NP  WIXELA INHUB 100-50 MCG/ACT AEPB INHALE 1 PUFF INTO THE LUNGS TWICE A DAY 02/05/23  Yes Clark, Katherine K, NP  erythromycin  ophthalmic ointment Place 1 Application into the left eye at bedtime. Patient not taking: Reported on 01/25/2024 01/04/24   Angelia Kelp, PA-C  hydrOXYzine  (ATARAX ) 10 MG tablet TAKE 1-2 TABLETS (10-20 MG TOTAL) BY MOUTH 2 (TWO) TIMES DAILY AS NEEDED FOR ANXIETY. 02/04/24   Dorothe Gaster, NP  letrozole (FEMARA) 2.5 MG tablet Take 2.5 mg by mouth daily. 11/05/21   [provider]  levothyroxine (SYNTHROID) 25 MCG tablet Take 25 mcg by mouth daily. 07/16/23   [provider]  norethindrone (AYGESTIN) 5 MG tablet Take 2.5 mg by mouth 2 (two) times daily. 07/22/22   [provider]  ondansetron  (ZOFRAN -ODT) 8 MG disintegrating tablet Take 1 tablet (8 mg total) by mouth every 8 (eight) hours as needed for nausea or vomiting. 12/29/23   Farris Hong, PA-C  tiZANidine  (ZANAFLEX ) 4 MG tablet Take 4 mg by mouth 3 (three) times daily as needed. 01/05/24   [provider]    Family History Family History  Problem Relation Age of Onset   Arthritis Mother    Asthma Mother    Depression Mother    Hyperlipidemia Mother    Hypertension Mother    Kidney disease Mother    Cancer Father    Diabetes Father    Early death Father    Hyperlipidemia Father    Hypertension Father    Heart failure Father    Asthma Sister    Hypertension Sister    Miscarriages / Stillbirths Sister    Breast cancer Paternal Aunt 19   Osteoarthritis Maternal Grandmother    Asthma Maternal Grandmother    Cancer Maternal Grandmother    COPD Maternal Grandmother    Depression Maternal Grandmother    Early death Maternal Grandmother    Osteoarthritis Maternal Grandfather    Asthma Maternal Grandfather    COPD Maternal Grandfather     Hypertension Maternal Grandfather    Hyperlipidemia Maternal Grandfather    Cancer Paternal Grandmother    Diabetes Paternal Grandmother    Diabetes Paternal Grandfather    Hyperlipidemia Paternal Grandfather    Heart failure Paternal Grandfather    Kidney disease Paternal Grandfather    Alcohol abuse Brother    Hypertension Brother    Hyperlipidemia Other    Heart failure Other  Kidney disease Other     Social History Social History   Tobacco Use   Smoking status: Never   Smokeless tobacco: Never  Vaping Use   Vaping status: Never Used  Substance Use Topics   Alcohol use: Yes    Comment: 1 drink every two weeks    Drug use: Never     Allergies   Clarithromycin   Review of Systems Review of Systems   Physical Exam Triage Vital Signs ED Triage Vitals  Encounter Vitals Group     BP 03/25/24 1255 120/79     Systolic BP Percentile --      Diastolic BP Percentile --      Pulse Rate 03/25/24 1255 79     Resp 03/25/24 1255 18     Temp 03/25/24 1255 98.7 F (37.1 C)     Temp Source 03/25/24 1255 Oral     SpO2 03/25/24 1255 96 %     Weight --      Height --      Head Circumference --      Peak Flow --      Pain Score 03/25/24 1258 6     Pain Loc --      Pain Education --      Exclude from Growth Chart --    No data found.  Updated Vital Signs BP 120/79 (BP Location: Left Arm)   Pulse 79   Temp 98.7 F (37.1 C) (Oral)   Resp 18   LMP 03/15/2024   SpO2 96%   Visual Acuity Right Eye Distance:   Left Eye Distance:   Bilateral Distance:    Right Eye Near:   Left Eye Near:    Bilateral Near:     Physical Exam Constitutional:      Appearance: Normal appearance.  Eyes:     Extraocular Movements: Extraocular movements intact.  Pulmonary:     Effort: Pulmonary effort is normal.  Musculoskeletal:     Comments: Tenderness to the midline in the left lower aspect of the lumbar region, no spinal tenderness noted, limited range of motion due to pain  elicited,   Neurological:     Mental Status: She is alert and oriented to person, place, and time. Mental status is at baseline.      UC Treatments / Results  Labs (all labs ordered are listed, but only abnormal results are displayed) Labs Reviewed - No data to display  EKG   Radiology No results found.  Procedures Procedures (including critical care time)  Medications Ordered in UC Medications  ketorolac  (TORADOL ) 30 MG/ML injection 30 mg (30 mg Intramuscular Given 03/25/24 1309)    Initial Impression / Assessment and Plan / UC Course  I have reviewed the triage vital signs and the nursing notes.  Pertinent labs & imaging results that were available during my care of the patient were reviewed by me and considered in my medical decision making (see chart for details).  Acute left-sided low back pain with left-sided sciatica  Etiology most most likely muscular, deferring imaging, unavailable today, Toradol  IM given and prescribed prednisone  taper for home use, endorses the availability of muscle relaxant, recommended supportive care through [facility], heat massage stretching with activity as tolerated and walker referral given to orthopedics if no improvement seen Final Clinical Impressions(s) / UC Diagnoses   Final diagnoses:  Acute left-sided low back pain with left-sided sciatica     Discharge Instructions      Your pain is most likely  caused by irritation to the muscles causing compression to the spine  You have been given an injection of Toradol  to help reduce inflammation and pain and ideally will see improvement within the hour  Starting tomorrow take prednisone  as directed, may use Tylenol , topical medicines or muscle relaxers for additional comfort  You may use heating pad in 15 minute intervals as needed for additional comfort  Begin stretching affected area daily for 10 minutes as tolerated to further loosen muscles   When lying down place pillow underneath  and between knees for support  Can try sleeping without pillow on firm mattress   Practice good posture: head back, shoulders back, chest forward, pelvis back and weight distributed evenly on both legs  If pain persist after recommended treatment or reoccurs if may be beneficial to follow up with orthopedic specialist for evaluation, this doctor specializes in the bones and can manage your symptoms long-term with options such as but not limited to imaging, medications or physical therapy      ED Prescriptions     Medication Sig Dispense Auth. Provider   predniSONE  (STERAPRED UNI-PAK 21 TAB) 10 MG (21) TBPK tablet Take by mouth daily. Take 6 tabs by mouth daily  for 2 days, then 5 tabs for 2 days, then 4 tabs for 2 days, then 3 tabs for 2 days, 2 tabs for 2 days, then 1 tab by mouth daily for 2 days 42 tablet Jarion Hawthorne, Maybelle Spatz, NP      PDMP not reviewed this encounter.   Reena Canning, [place] 03/25/24 1317

## 2024-03-25 NOTE — Discharge Instructions (Signed)
 Your pain is most likely caused by irritation to the muscles causing compression to the spine  You have been given an injection of Toradol  to help reduce inflammation and pain and ideally will see improvement within the hour  Starting tomorrow take prednisone  as directed, may use Tylenol , topical medicines or muscle relaxers for additional comfort  You may use heating pad in 15 minute intervals as needed for additional comfort  Begin stretching affected area daily for 10 minutes as tolerated to further loosen muscles   When lying down place pillow underneath and between knees for support  Can try sleeping without pillow on firm mattress   Practice good posture: head back, shoulders back, chest forward, pelvis back and weight distributed evenly on both legs  If pain persist after recommended treatment or reoccurs if may be beneficial to follow up with orthopedic specialist for evaluation, this doctor specializes in the bones and can manage your symptoms long-term with options such as but not limited to imaging, medications or physical therapy

## 2024-03-25 NOTE — Telephone Encounter (Signed)
 Patient called, left VM to return the call to the office to speak to NT, looks like pt has appt with UC scheduled. For today.  Copied from CRM [id number]. Topic: Clinical - Red Word Triage >> Mar 25, 2024  9:51 AM Bambi Bonine D wrote: Red Word that prompted transfer to Nurse Triage: extreme back pain  Patient stated that she has extreme back pain and wanted to proceed with scheduling an appointment.  UPDATE: While reaching out to Nurse Triage the patient' call dropped. I called the patient back and it went to voicemail.

## 2024-03-25 NOTE — ED Triage Notes (Signed)
 Lower back pain started last night after helping her dog. Denies any injuries or trauma. Patient states she has a history of sciatica pain.

## 2024-03-25 NOTE — Progress Notes (Signed)
 Patient keeping in person UC visit

## 2024-03-25 NOTE — Telephone Encounter (Signed)
 This encounter was created in error - please disregard.

## 2024-03-25 NOTE — Telephone Encounter (Signed)
 Called patient back; states she ended up making an apt. At [facility] for today;

## 2024-04-07 ENCOUNTER — Other Ambulatory Visit: Payer: Self-pay | Admitting: Nurse Practitioner

## 2024-04-07 DIAGNOSIS — F411 Generalized anxiety disorder: Secondary | ICD-10-CM

## 2024-04-21 ENCOUNTER — Other Ambulatory Visit: Payer: Self-pay | Admitting: Primary Care

## 2024-04-21 DIAGNOSIS — F411 Generalized anxiety disorder: Secondary | ICD-10-CM

## 2024-04-26 ENCOUNTER — Encounter: Payer: 59 | Admitting: Primary Care

## 2024-05-02 ENCOUNTER — Telehealth: Admitting: Nurse Practitioner

## 2024-05-02 DIAGNOSIS — B3731 Acute candidiasis of vulva and vagina: Secondary | ICD-10-CM

## 2024-05-02 MED ORDER — FLUCONAZOLE 150 MG PO TABS
150.0000 mg | ORAL_TABLET | Freq: Every day | ORAL | 0 refills | Status: DC
Start: 2024-05-02 — End: 2024-07-01

## 2024-05-02 NOTE — Progress Notes (Signed)

## 2024-05-13 ENCOUNTER — Encounter: Admitting: Primary Care

## 2024-05-20 ENCOUNTER — Encounter: Admitting: Primary Care

## 2024-05-31 ENCOUNTER — Encounter: Admitting: Primary Care

## 2024-06-06 ENCOUNTER — Ambulatory Visit: Admitting: Cardiology

## 2024-06-07 ENCOUNTER — Other Ambulatory Visit: Payer: Self-pay | Admitting: Primary Care

## 2024-06-07 DIAGNOSIS — F411 Generalized anxiety disorder: Secondary | ICD-10-CM

## 2024-06-10 ENCOUNTER — Encounter: Payer: Self-pay | Admitting: Cardiology

## 2024-06-10 ENCOUNTER — Ambulatory Visit: Admitting: Physician Assistant

## 2024-06-10 NOTE — Progress Notes (Deleted)
 Cardiology Office Note    Date:  06/10/2024   ID:  Kelli Cox, DOB 1976/01/09, MRN [id number]  PCP:  Gretta Comer POUR, NP  Cardiologist:  Redell Cave, MD  Electrophysiologist:  None   Chief Complaint: ***  History of Present Illness:   Kelli Cox is a 48 y.o. female with history of DM2, obesity, migraine disorder, and anxiety who presents for follow-up of ***  She was evaluated as a new patient by Dr. Cave on 10/11/2021 for shortness of breath.  At that time, she reported shortness of breath with overexertion for several months.  She reported she previously exercised, though had to discontinue 2 months prior secondary to sustaining a concussion in the setting of a mechanical fall [place] a [employer] [place].  She also reported planning on proceeding with IVF with recommendation for preprocedure stress test.  She denied any chest pain.  Lexiscan  MPI on 10/29/2021 showed no evidence of ischemia or scar with an LVEF greater than 65%.  There was no significant coronary artery calcification.  Overall, this was a low risk stress test.  Echo on 11/18/2021 demonstrated an EF of 55 to 60%, no regional wall motion abnormalities, normal LV diastolic function parameters, normal RV systolic function and ventricular cavity size, no significant valvular abnormalities, and an estimated right atrial pressure of 3 mmHg.  She was last seen in the office in 12/2021 and was without chest pain, symptoms of cardiac decompensation, or further dyspnea.  Blood pressure was elevated in the office though well-controlled at home.  She was seen in the ED in 06/2023 for chest pain with a negative high-sensitivity troponin, normal D-dimer, and nonacute EKG.  ***   Labs independently reviewed: 01/2024 -Hgb 15.2, PLT 279 01/2024 -potassium 4.0, BUN 10, serum creatinine 0.94, A1c 5.4 06/2023 -albumin 3.9, AST/ALT normal 04/2023 -TC 142, TG 88, HDL 34, LDL 89, TSH normal  Past Medical History:  Diagnosis Date    Acute non-recurrent sinusitis 10/17/2020   Asthma    Chicken pox    Concussion with no loss of consciousness 08/27/2021   GAD (generalized anxiety disorder)    Leg pain, lateral, left 07/31/2022   Migraines    Mild intermittent asthma with (acute) exacerbation 10/31/2022   Pain of upper abdomen 01/23/2021   Shortness of breath 11/18/2022   Type 2 diabetes mellitus (HCC)     Past Surgical History:  Procedure Laterality Date   CHOLECYSTECTOMY  2008   COLONOSCOPY WITH PROPOFOL  N/A 07/05/2021   Procedure: COLONOSCOPY WITH PROPOFOL ;  Surgeon: Therisa Bi, MD;  Location: [facility] ENDOSCOPY;  Service: Gastroenterology;  Laterality: N/A;   LIPOMA RESECTION  2012   WRIST SURGERY Left    2001 and 2007    Current Medications: No outpatient medications have been marked as taking for the 06/10/24 encounter (Appointment) with Abigail Bernardino HERO, PA-C.    Allergies:   Clarithromycin   Social History   Socioeconomic History   Marital status: Single    Spouse name: Not on file   Number of children: Not on file   Years of education: Not on file   Highest education level: Master's degree (e.g., MA, MS, MEng, MEd, MSW, MBA)  Occupational History   Occupation: Emergency planning/management officer  Tobacco Use   Smoking status: Never   Smokeless tobacco: Never  Vaping Use   Vaping status: Never Used  Substance and Sexual Activity   Alcohol use: Yes    Comment: 1 drink every two weeks    Drug  use: Never   Sexual activity: Yes    Partners: Male    Birth control/protection: None  Other Topics Concern   Not on file  Social History Narrative   Not on file   Social Drivers of Health   Financial Resource Strain: Medium Risk (01/22/2024)   Overall Financial Resource Strain (CARDIA)    Difficulty of Paying Living Expenses: Somewhat hard  Food Insecurity: No Food Insecurity (01/22/2024)   Hunger Vital Sign    Worried About Running Out of Food in the Last Year: Never true    Ran Out of Food in the Last Year: Never true   Transportation Needs: No Transportation Needs (01/22/2024)   PRAPARE - Administrator, Civil Service (Medical): No    Lack of Transportation (Non-Medical): No  Physical Activity: Insufficiently Active (01/22/2024)   Exercise Vital Sign    Days of Exercise per Week: 3 days    Minutes of Exercise per Session: 20 min  Stress: Stress Concern Present (01/22/2024)   [employer] of Occupational Health - Occupational Stress Questionnaire    Feeling of Stress : To some extent  Social Connections: Moderately Integrated (01/22/2024)   Social Connection and Isolation Panel    Frequency of Communication with Friends and Family: More than three times a week    Frequency of Social Gatherings with Friends and Family: Once a week    Attends Religious Services: 1 to 4 times per year    Active Member of [employer] or Organizations: No    Attends Engineer, structural: Not on file    Marital Status: Living with partner     Family History:  The patient's family history includes Alcohol abuse in her brother; Arthritis in her mother; Asthma in her maternal grandfather, maternal grandmother, mother, and sister; Breast cancer (age of onset: 29) in her paternal aunt; COPD in her maternal grandfather and maternal grandmother; Cancer in her father, maternal grandmother, and paternal grandmother; Depression in her maternal grandmother and mother; Diabetes in her father, paternal grandfather, and paternal grandmother; Early death in her father and maternal grandmother; Heart failure in her father, paternal grandfather, and another family member; Hyperlipidemia in her father, maternal grandfather, mother, paternal grandfather, and another family member; Hypertension in her brother, father, maternal grandfather, mother, and sister; Kidney disease in her mother, paternal grandfather, and another family member; Miscarriages / [place] in her sister; Osteoarthritis in her maternal grandfather and maternal  grandmother.  ROS:   12-point review of systems is negative unless otherwise noted in the HPI.   EKGs/Labs/Other Studies Reviewed:    Studies reviewed were summarized above. The additional studies were reviewed today:  Lexiscan  MPI 10/29/2021:     Normal pharmacologic myocardial perfusion stress test without evidence of significant ischemia or scar.   Normal left ventricular systolic function (LVEF > 65%).   There is no significant coronary artery calcification.   Incidental note is made of hepatic steatosis on the attenuation correction CT.   This is a low risk study. __________   2D echo 11/18/2021: 1. Left ventricular ejection fraction, by estimation, is 55 to 60%. The  left ventricle has normal function. The left ventricle has no regional  wall motion abnormalities. Left ventricular diastolic parameters were  normal.   2. Right ventricular systolic function is normal. The right ventricular  size is normal.   3. The mitral valve was not well visualized. No evidence of mitral valve  regurgitation.   4. The aortic valve was not well  visualized. Aortic valve regurgitation  is not visualized.   5. The inferior vena cava is normal in size with greater than 50%  respiratory variability, suggesting right atrial pressure of 3 mmHg.   EKG:  EKG is ordered today.  The EKG ordered today demonstrates ***  Recent Labs: 06/18/2023: ALT 18 01/25/2024: BUN 10; Creatinine, Ser 0.94; Potassium 4.0; Sodium 140 02/16/2024: Hemoglobin 15.2; Platelets 279.0  Recent Lipid Panel    Component Value Date/Time   CHOL 142 04/23/2023 0757   TRIG 88.0 04/23/2023 0757   HDL 34.60 (L) 04/23/2023 0757   CHOLHDL 4 04/23/2023 0757   VLDL 17.6 04/23/2023 0757   LDLCALC 89 04/23/2023 0757   LDLDIRECT 107.0 06/19/2022 0843    PHYSICAL EXAM:    VS:  There were no vitals taken for this visit.  BMI: There is no height or weight on file to calculate BMI.  Physical Exam  Wt Readings from Last 3  Encounters:  01/25/24 222 lb (100.7 kg)  09/24/23 253 lb (114.8 kg)  07/21/23 268 lb (121.6 kg)     ASSESSMENT & PLAN:   Exertional dyspnea:  Elevated BP without diagnosis of hypertension:  Obesity with sleep disordered breathing:   {Are you ordering a CV Procedure (e.g. stress test, cath, DCCV, TEE, etc)?   Press F2        :[id number]}     Disposition: F/u with Dr. Darliss or an APP in ***.   Medication Adjustments/Labs and Tests Ordered: Current medicines are reviewed at length with the patient today.  Concerns regarding medicines are outlined above. Medication changes, Labs and Tests ordered today are summarized above and listed in the Patient Instructions accessible in Encounters.   Signed, Bernardino Bring, PA-C 06/10/2024 6:50 AM     [facility] [place] [address] [place], [place] [address] [phone]

## 2024-06-14 ENCOUNTER — Encounter: Admitting: Primary Care

## 2024-06-22 ENCOUNTER — Encounter: Admitting: Primary Care

## 2024-06-24 ENCOUNTER — Other Ambulatory Visit: Payer: Self-pay | Admitting: Primary Care

## 2024-06-24 DIAGNOSIS — E1165 Type 2 diabetes mellitus with hyperglycemia: Secondary | ICD-10-CM

## 2024-07-01 ENCOUNTER — Ambulatory Visit: Admitting: Primary Care

## 2024-07-01 ENCOUNTER — Encounter: Payer: Self-pay | Admitting: Primary Care

## 2024-07-01 VITALS — BP 122/80 | HR 78 | Temp 97.2°F | Ht 68.0 in | Wt 218.0 lb

## 2024-07-01 DIAGNOSIS — E1165 Type 2 diabetes mellitus with hyperglycemia: Secondary | ICD-10-CM | POA: Diagnosis not present

## 2024-07-01 DIAGNOSIS — Z0000 Encounter for general adult medical examination without abnormal findings: Secondary | ICD-10-CM

## 2024-07-01 DIAGNOSIS — J4520 Mild intermittent asthma, uncomplicated: Secondary | ICD-10-CM

## 2024-07-01 DIAGNOSIS — G43009 Migraine without aura, not intractable, without status migrainosus: Secondary | ICD-10-CM

## 2024-07-01 DIAGNOSIS — F411 Generalized anxiety disorder: Secondary | ICD-10-CM

## 2024-07-01 DIAGNOSIS — Z1231 Encounter for screening mammogram for malignant neoplasm of breast: Secondary | ICD-10-CM

## 2024-07-01 LAB — MICROALBUMIN / CREATININE URINE RATIO
Creatinine,U: 96.9 mg/dL
Microalb Creat Ratio: 9 mg/g (ref 0.0–30.0)
Microalb, Ur: 0.9 mg/dL (ref 0.0–1.9)

## 2024-07-01 LAB — LIPID PANEL
Cholesterol: 130 mg/dL (ref 0–200)
HDL: 30.1 mg/dL — ABNORMAL LOW (ref >39.00)
LDL Cholesterol: 81 mg/dL (ref 0–99)
NonHDL: 100.34
Total CHOL/HDL Ratio: 4
Triglycerides: 96 mg/dL (ref 0.0–149.0)
VLDL: 19.2 mg/dL (ref 0.0–40.0)

## 2024-07-01 LAB — HEMOGLOBIN A1C: Hgb A1c MFr Bld: 5.5 % (ref 4.6–6.5)

## 2024-07-01 NOTE — Assessment & Plan Note (Signed)
 Repeat A1C pending.   Agree to increase Mounjaro  to 15 mg weekly for weight loss purposes. She will continue her 12.5 mg dose and notify us  once she is her last pen.  Urine microalbumin due and pending.  Follow-up in 6 months.

## 2024-07-01 NOTE — Progress Notes (Signed)
 Subjective:    Patient ID: Kelli Cox, female    DOB: 19-Apr-1976, 48 y.o.   MRN: [id number]  HPI  Kelli Cox is a very pleasant 48 y.o. female who presents today for complete physical and follow up of chronic conditions.  She would also like to increase her Mounjaro  dose due to a plateau in her weight loss. She is exercising regularly and eating healthy. She denies nausea, GI upset, constipation with her 12.5 mg dose.  Immunizations: -Tetanus: Completed in 2022   -Pneumonia: Completed 2018  Diet: Fair diet.  Exercise: No regular exercise.  Eye exam: Completes annually  Dental exam: Completes semi-annually    Pap Smear: Completed in June 2022, follows with GYN Mammogram: Completed in May 2024  Colonoscopy: Completed in 2022, due 2032  Wt Readings from Last 3 Encounters:  07/01/24 218 lb (98.9 kg)  01/25/24 222 lb (100.7 kg)  09/24/23 253 lb (114.8 kg)      Review of Systems  Constitutional:  Negative for unexpected weight change.  HENT:  Negative for rhinorrhea.   Respiratory:  Negative for cough and shortness of breath.   Cardiovascular:  Negative for chest pain.  Gastrointestinal:  Negative for constipation and diarrhea.  Genitourinary:  Negative for difficulty urinating.  Musculoskeletal:  Negative for arthralgias and myalgias.  Skin:  Negative for rash.  Allergic/Immunologic: Negative for environmental allergies.  Neurological:  Negative for dizziness and headaches.  Psychiatric/Behavioral:  The patient is not nervous/anxious.          Past Medical History:  Diagnosis Date   Acute non-recurrent sinusitis 10/17/2020   Acute pain of left shoulder 06/19/2023   Asthma    Chicken pox    Concussion with no loss of consciousness 08/27/2021   GAD (generalized anxiety disorder)    Leg pain, lateral, left 07/31/2022   Migraines    Mild intermittent asthma with (acute) exacerbation 10/31/2022   Pain of upper abdomen 01/23/2021   Shortness of breath  11/18/2022   Type 2 diabetes mellitus (HCC)     Social History   Socioeconomic History   Marital status: Single    Spouse name: Not on file   Number of children: Not on file   Years of education: Not on file   Highest education level: Master's degree (e.g., MA, MS, MEng, MEd, MSW, MBA)  Occupational History   Occupation: Emergency planning/management officer  Tobacco Use   Smoking status: Never   Smokeless tobacco: Never  Vaping Use   Vaping status: Never Used  Substance and Sexual Activity   Alcohol use: Yes    Comment: 1 drink every two weeks    Drug use: Never   Sexual activity: Yes    Partners: Male    Birth control/protection: None  Other Topics Concern   Not on file  Social History Narrative   Not on file   Social Drivers of Health   Financial Resource Strain: Low Risk  (06/11/2024)   Overall Financial Resource Strain (CARDIA)    Difficulty of Paying Living Expenses: Not very hard  Food Insecurity: No Food Insecurity (06/11/2024)   Hunger Vital Sign    Worried About Running Out of Food in the Last Year: Never true    Ran Out of Food in the Last Year: Never true  Transportation Needs: No Transportation Needs (06/11/2024)   PRAPARE - Administrator, Civil Service (Medical): No    Lack of Transportation (Non-Medical): No  Physical Activity: Sufficiently Active (06/11/2024)  Exercise Vital Sign    Days of Exercise per Week: 5 days    Minutes of Exercise per Session: 30 min  Stress: No Stress Concern Present (06/11/2024)   [employer] of Occupational Health - Occupational Stress Questionnaire    Feeling of Stress: Only a little  Social Connections: Moderately Integrated (06/11/2024)   Social Connection and Isolation Panel    Frequency of Communication with Friends and Family: More than three times a week    Frequency of Social Gatherings with Friends and Family: Once a week    Attends Religious Services: 1 to 4 times per year    Active Member of [employer] or Organizations:  No    Attends Banker Meetings: Not on file    Marital Status: Living with partner  Intimate Partner Violence: Not on file    Past Surgical History:  Procedure Laterality Date   CHOLECYSTECTOMY  2008   COLONOSCOPY WITH PROPOFOL  N/A 07/05/2021   Procedure: COLONOSCOPY WITH PROPOFOL ;  Surgeon: Therisa Bi, MD;  Location: [facility] ENDOSCOPY;  Service: Gastroenterology;  Laterality: N/A;   LIPOMA RESECTION  2012   WRIST SURGERY Left    2001 and 2007    Family History  Problem Relation Age of Onset   Arthritis Mother    Asthma Mother    Depression Mother    Hyperlipidemia Mother    Hypertension Mother    Kidney disease Mother    Cancer Father    Diabetes Father    Early death Father    Hyperlipidemia Father    Hypertension Father    Heart failure Father    Asthma Sister    Hypertension Sister    Miscarriages / Stillbirths Sister    Breast cancer Paternal Aunt 79   Osteoarthritis Maternal Grandmother    Asthma Maternal Grandmother    Cancer Maternal Grandmother    COPD Maternal Grandmother    Depression Maternal Grandmother    Early death Maternal Grandmother    Osteoarthritis Maternal Grandfather    Asthma Maternal Grandfather    COPD Maternal Grandfather    Hypertension Maternal Grandfather    Hyperlipidemia Maternal Grandfather    Cancer Paternal Grandmother    Diabetes Paternal Grandmother    Diabetes Paternal Grandfather    Hyperlipidemia Paternal Grandfather    Heart failure Paternal Grandfather    Kidney disease Paternal Grandfather    Alcohol abuse Brother    Hypertension Brother    Hyperlipidemia Other    Heart failure Other    Kidney disease Other     Allergies  Allergen Reactions   Clarithromycin Nausea And Vomiting    Current Outpatient Medications on File Prior to Visit  Medication Sig Dispense Refill   albuterol  (VENTOLIN  HFA) 108 (90 Base) MCG/ACT inhaler Inhale 1-2 puffs into the lungs every 6 (six) hours as needed for wheezing or  shortness of breath. 1 each 0   butalbital -aspirin -caffeine  (FIORINAL ) 50-325-40 MG capsule Take 1 capsule by mouth every 6 (six) hours as needed for headache. For headache/migraines 20 capsule 0   cetirizine  (ZYRTEC ) 10 MG tablet Take 1 tablet (10 mg total) by mouth daily. 30 tablet 11   Continuous Glucose Sensor (FREESTYLE LIBRE 3 SENSOR) MISC Place 1 sensor on the skin every 14 days. Use to check glucose continuously 6 each 1   hydrOXYzine  (ATARAX ) 10 MG tablet TAKE 1-2 TABLETS (10-20 MG TOTAL) BY MOUTH 2 (TWO) TIMES DAILY AS NEEDED FOR ANXIETY. 60 tablet 0   letrozole (FEMARA) 2.5 MG tablet Take 2.5  mg by mouth daily.     levothyroxine (SYNTHROID) 25 MCG tablet Take 25 mcg by mouth daily.     Multiple Vitamins-Minerals (MULTIVITAMIN WITH MINERALS) tablet Take 1 tablet by mouth daily.     norethindrone (AYGESTIN) 5 MG tablet Take 2.5 mg by mouth 2 (two) times daily.     sertraline  (ZOLOFT ) 100 MG tablet TAKE 1 TABLET (100 MG TOTAL) BY MOUTH DAILY. FOR ANXIETY 90 tablet 0   tirzepatide  (MOUNJARO ) 12.5 MG/0.5ML Pen INJECT 12.5 MG INTO THE SKIN ONCE A WEEK. FOR DIABETES. 6 mL 0   tiZANidine  (ZANAFLEX ) 4 MG tablet Take 4 mg by mouth 3 (three) times daily as needed.     WIXELA INHUB 100-50 MCG/ACT AEPB INHALE 1 PUFF INTO THE LUNGS TWICE A DAY (Patient not taking: Reported on 07/01/2024) 60 each 2   No current facility-administered medications on file prior to visit.    BP 122/80   Pulse 78   Temp (!) 97.2 F (36.2 C) (Temporal)   Ht 5' 8 (1.727 m)   Wt 218 lb (98.9 kg)   LMP 06/23/2024   SpO2 98%   BMI 33.15 kg/m  Objective:   Physical Exam HENT:     Right Ear: Tympanic membrane and ear canal normal.     Left Ear: Tympanic membrane and ear canal normal.  Eyes:     Pupils: Pupils are equal, round, and reactive to light.  Cardiovascular:     Rate and Rhythm: Normal rate and regular rhythm.  Pulmonary:     Effort: Pulmonary effort is normal.     Breath sounds: Normal breath sounds.   Abdominal:     General: Bowel sounds are normal.     Palpations: Abdomen is soft.     Tenderness: There is no abdominal tenderness.  Musculoskeletal:        General: Normal range of motion.     Cervical back: Neck supple.  Skin:    General: Skin is warm and dry.  Neurological:     Mental Status: She is alert and oriented to person, place, and time.     Cranial Nerves: No cranial nerve deficit.     Deep Tendon Reflexes:     Reflex Scores:      Patellar reflexes are 2+ on the right side and 2+ on the left side. Psychiatric:        Mood and Affect: Mood normal.           Assessment & Plan:  Preventative health care Assessment & Plan: Immunizations UTD. Pap smear due, she would like to schedule a GYN Mammogram due, orders placed. Colonoscopy UTD, due 2032  Discussed the importance of a healthy diet and regular exercise in order for weight loss, and to reduce the risk of further co-morbidity.  Exam stable. Labs pending.  Follow up in 1 year for repeat physical.    Screening mammogram for breast cancer -     3D Screening Mammogram, Left and Right; Future  Migraine without aura and without status migrainosus, not intractable Assessment & Plan: Controlled.  Continue ot monitor.    Mild intermittent asthma without complication Assessment & Plan: Controlled.  Continue Wixela 100-50 mcg BID during season changes.  Continue albuterol  inhaler PRN   Type 2 diabetes mellitus with hyperglycemia, without long-term current use of insulin (HCC) Assessment & Plan: Repeat A1C pending.   Agree to increase Mounjaro  to 15 mg weekly for weight loss purposes. She will continue her 12.5 mg dose and notify  us  once she is her last pen.  Urine microalbumin due and pending.  Follow-up in 6 months.  Orders: -     Lipid panel -     Hemoglobin A1c -     Microalbumin / creatinine urine ratio  GAD (generalized anxiety disorder) Assessment & Plan: Controlled.  Continue  sertraline  100 mg daily.         Daeja Helderman K Lanijah Warzecha, NP

## 2024-07-01 NOTE — Assessment & Plan Note (Signed)
 Controlled.  Continue Wixela 100-50 mcg BID during season changes.  Continue albuterol  inhaler PRN

## 2024-07-01 NOTE — Assessment & Plan Note (Signed)
 Controlled.  Continue ot monitor.

## 2024-07-01 NOTE — Assessment & Plan Note (Signed)
 Immunizations UTD. Pap smear due, she would like to schedule a GYN Mammogram due, orders placed. Colonoscopy UTD, due 2032  Discussed the importance of a healthy diet and regular exercise in order for weight loss, and to reduce the risk of further co-morbidity.  Exam stable. Labs pending.  Follow up in 1 year for repeat physical.

## 2024-07-01 NOTE — Assessment & Plan Note (Signed)
 Controlled.  Continue sertraline 100 mg daily.

## 2024-07-03 ENCOUNTER — Ambulatory Visit: Payer: Self-pay | Admitting: Primary Care

## 2024-07-20 DIAGNOSIS — E1165 Type 2 diabetes mellitus with hyperglycemia: Secondary | ICD-10-CM

## 2024-07-21 ENCOUNTER — Telehealth: Payer: Self-pay | Admitting: Pharmacy Technician

## 2024-07-21 ENCOUNTER — Other Ambulatory Visit (HOSPITAL_COMMUNITY): Payer: Self-pay

## 2024-07-21 MED ORDER — TIRZEPATIDE 15 MG/0.5ML ~~LOC~~ SOAJ: 15.0000 mg | SUBCUTANEOUS | 0 refills | Status: DC

## 2024-07-21 NOTE — Telephone Encounter (Signed)
 Pharmacy Patient Advocate Encounter   Received notification from CoverMyMeds that prior authorization for Mounjaro  15MG /0.5ML auto-injectors  is required/requested.   Insurance verification completed.   The patient is insured through [employer] .   Per test claim: PA required; PA submitted to above mentioned insurance via Latent Key/confirmation #/EOC [id number] Status is pending

## 2024-07-21 NOTE — Telephone Encounter (Signed)
 Pharmacy Patient Advocate Encounter  Received notification from [facility] that Prior Authorization for Mounjaro  15MG /0.5ML auto-injectors has been APPROVED from 06/21/24 to 07/21/25. Ran test claim, Copay is $25.00. This test claim was processed through [facility]- copay amounts may vary at other pharmacies due to pharmacy/plan contracts, or as the patient moves through the different stages of their insurance plan.   PA #/Case ID/Reference #: [id number]

## 2024-07-27 ENCOUNTER — Encounter: Payer: Self-pay | Admitting: Cardiology

## 2024-07-27 ENCOUNTER — Ambulatory Visit: Attending: Cardiology | Admitting: Cardiology

## 2024-07-27 VITALS — BP 130/72 | HR 84 | Ht 65.0 in | Wt 218.2 lb

## 2024-07-27 DIAGNOSIS — Z01810 Encounter for preprocedural cardiovascular examination: Secondary | ICD-10-CM | POA: Diagnosis not present

## 2024-07-27 DIAGNOSIS — R0600 Dyspnea, unspecified: Secondary | ICD-10-CM | POA: Diagnosis not present

## 2024-07-27 DIAGNOSIS — Z6836 Body mass index (BMI) 36.0-36.9, adult: Secondary | ICD-10-CM

## 2024-07-27 NOTE — Patient Instructions (Signed)
 Medication Instructions:  Your physician recommends that you continue on your current medications as directed. Please refer to the Current Medication list given to you today.   *If you need a refill on your cardiac medications before your next appointment, please call your pharmacy*  Lab Work: No labs ordered today  If you have labs (blood work) drawn today and your tests are completely normal, you will receive your results only by: MyChart Message (if you have MyChart) OR A paper copy in the mail If you have any lab test that is abnormal or we need to change your treatment, we will call you to review the results.  Testing/Procedures: Your physician has requested that you have an echocardiogram. Echocardiography is a painless test that uses sound waves to create images of your heart. It provides your doctor with information about the size and shape of your heart and how well your heart's chambers and valves are working.   You may receive an ultrasound enhancing agent through an IV if needed to better visualize your heart during the echo. This procedure takes approximately one hour.  There are no restrictions for this procedure.  This will take place at [address] [facility]) [address], [place] [address]  Please note: We ask at that you not bring children with you during ultrasound (echo/ vascular) testing. Due to room size and safety concerns, children are not allowed in the ultrasound rooms during exams. Our front office staff cannot provide observation of children in our lobby area while testing is being conducted. An adult accompanying a patient to their appointment will only be allowed in the ultrasound room at the discretion of the ultrasound technician under special circumstances. We apologize for any inconvenience.   Your provider has ordered a Lexiscan / Exercise Myoview  Stress test. This will take place at [facility]. Please report to the [facility] medical mall entrance. The  volunteers at the first desk will direct you where to go.  [facility] MYOVIEW   Your provider has ordered a Stress Test with nuclear imaging. The purpose of this test is to evaluate the blood supply to your heart muscle. This procedure is referred to as a Non-Invasive Stress Test. This is because other than having an IV started in your vein, nothing is inserted or invades your body. Cardiac stress tests are done to find areas of poor blood flow to the heart by determining the extent of coronary artery disease (CAD). Some patients exercise on a treadmill, which naturally increases the blood flow to your heart, while others who are unable to walk on a treadmill due to physical limitations will have a pharmacologic/chemical stress agent called Lexiscan  . This medicine will mimic walking on a treadmill by temporarily increasing your coronary blood flow.   Please note: these test may take anywhere between 2-4 hours to complete  How to prepare for your Myoview  test:  Nothing to eat for 6 hours prior to the test No caffeine  for 24 hours prior to test No smoking 24 hours prior to test. Your medication may be taken with water.  If your doctor stopped a medication because of this test, do not take that medication. Ladies, please do not wear dresses.  Skirts or pants are appropriate. Please wear a short sleeve shirt. No perfume, cologne or lotion. Wear comfortable walking shoes. No heels!   PLEASE NOTIFY THE OFFICE AT LEAST 24 HOURS IN ADVANCE IF YOU ARE UNABLE TO KEEP YOUR APPOINTMENT.  [phone] AND  PLEASE NOTIFY NUCLEAR MEDICINE AT  [facility] AT LEAST 24 HOURS IN ADVANCE IF YOU ARE UNABLE TO KEEP YOUR APPOINTMENT. [phone]   Follow-Up: At [facility], you and your health needs are our priority.  As part of our continuing mission to provide you with exceptional heart care, our providers are all part of one team.  This team includes your primary Cardiologist (physician) and Advanced Practice  Providers or APPs (Physician Assistants and Nurse Practitioners) who all work together to provide you with the care you need, when you need it.  Your next appointment:   3 month(s)  Provider:   You may see Redell Cave, MD or one of the following Advanced Practice Providers on your designated Care Team:   Lonni Meager, NP Lesley Maffucci, PA-C Bernardino Bring, PA-C Cadence Westview, PA-C Tylene Lunch, NP Barnie Hila, NP    We recommend signing up for the patient portal called MyChart.  Sign up information is provided on this After Visit Summary.  MyChart is used to connect with patients for Virtual Visits (Telemedicine).  Patients are able to view lab/test results, encounter notes, upcoming appointments, etc.  Non-urgent messages can be sent to your provider as well.   To learn more about what you can do with MyChart, go to [email or web address].   Other Instructions If echocardiogram and lexiscan  myoview  are normal, you can cancel your follow up visit with us 

## 2024-07-27 NOTE — Progress Notes (Signed)
 Cardiology Office Note:    Date:  07/27/2024   ID:  Kate JAYSON Sar, DOB 07-24-1976, MRN [id number]  PCP:  Gretta Comer POUR, NP   [facility] Providers Cardiologist:  Redell Cave, MD     Referring MD: Gretta Comer POUR, NP   Chief Complaint  Patient presents with   Follow-up    12 month follow up pt has been doing well with no complaints of chest pain, chest pressure or SOB, medciation reviewed verbally with patient    History of Present Illness:    Kelli Cox is a 48 y.o. female with a hx of diabetes, anxiety, obesity migraines who presents for follow-up.    Currently denies any symptoms.  Stated in the past due to symptoms of shortness of breath.  Echocardiogram and Lexiscan  Myoview  was unrevealing.  She is undergoing IVF treatments, requires stress test every 2 years.  Previously shortness of breath with exertion, denies chest pain, denies shortness of breath currently.  Has lost about 60 pounds states previous visit with myself, on Mounjaro  due to blood sugar control which has helped with weight loss.   Prior notes/testing Echo 10/2021 EF 55 to 60%. Lexiscan  Myoview  10/2021 no significant ischemia. Father had an MI in his 59s.  Past Medical History:  Diagnosis Date   Acute non-recurrent sinusitis 10/17/2020   Acute pain of left shoulder 06/19/2023   Asthma    Chicken pox    Concussion with no loss of consciousness 08/27/2021   GAD (generalized anxiety disorder)    Leg pain, lateral, left 07/31/2022   Migraines    Mild intermittent asthma with (acute) exacerbation 10/31/2022   Pain of upper abdomen 01/23/2021   Shortness of breath 11/18/2022   Type 2 diabetes mellitus [facility])     Past Surgical History:  Procedure Laterality Date   CHOLECYSTECTOMY  2008   COLONOSCOPY WITH PROPOFOL  N/A 07/05/2021   Procedure: COLONOSCOPY WITH PROPOFOL ;  Surgeon: Therisa Bi, MD;  Location: [facility] ENDOSCOPY;  Service: Gastroenterology;  Laterality: N/A;   LIPOMA RESECTION   2012   WRIST SURGERY Left    2001 and 2007    Current Medications: Current Meds  Medication Sig   albuterol  (VENTOLIN  HFA) 108 (90 Base) MCG/ACT inhaler Inhale 1-2 puffs into the lungs every 6 (six) hours as needed for wheezing or shortness of breath.   butalbital -aspirin -caffeine  (FIORINAL ) 50-325-40 MG capsule Take 1 capsule by mouth every 6 (six) hours as needed for headache. For headache/migraines   cetirizine  (ZYRTEC ) 10 MG tablet Take 1 tablet (10 mg total) by mouth daily.   Continuous Glucose Sensor (FREESTYLE LIBRE 3 SENSOR) MISC Place 1 sensor on the skin every 14 days. Use to check glucose continuously   hydrOXYzine  (ATARAX ) 10 MG tablet TAKE 1-2 TABLETS (10-20 MG TOTAL) BY MOUTH 2 (TWO) TIMES DAILY AS NEEDED FOR ANXIETY.   letrozole (FEMARA) 2.5 MG tablet Take 2.5 mg by mouth daily.   levothyroxine (SYNTHROID) 25 MCG tablet Take 25 mcg by mouth daily.   Multiple Vitamins-Minerals (MULTIVITAMIN WITH MINERALS) tablet Take 1 tablet by mouth daily.   norethindrone (AYGESTIN) 5 MG tablet Take 2.5 mg by mouth 2 (two) times daily.   sertraline  (ZOLOFT ) 100 MG tablet TAKE 1 TABLET (100 MG TOTAL) BY MOUTH DAILY. FOR ANXIETY   tirzepatide  (MOUNJARO ) 15 MG/0.5ML Pen Inject 15 mg into the skin once a week. for diabetes.   tiZANidine  (ZANAFLEX ) 4 MG tablet Take 4 mg by mouth 3 (three) times daily as needed.   WIXELA INHUB  100-50 MCG/ACT AEPB INHALE 1 PUFF INTO THE LUNGS TWICE A DAY     Allergies:   Clarithromycin   Social History   Socioeconomic History   Marital status: Single    Spouse name: Not on file   Number of children: Not on file   Years of education: Not on file   Highest education level: Master's degree (e.g., MA, MS, MEng, MEd, MSW, MBA)  Occupational History   Occupation: Emergency planning/management officer  Tobacco Use   Smoking status: Never   Smokeless tobacco: Never  Vaping Use   Vaping status: Never Used  Substance and Sexual Activity   Alcohol use: Yes    Comment: 1 drink every  two weeks    Drug use: Never   Sexual activity: Yes    Partners: Male    Birth control/protection: None  Other Topics Concern   Not on file  Social History Narrative   Not on file   Social Drivers of Health   Financial Resource Strain: Low Risk  (06/11/2024)   Overall Financial Resource Strain (CARDIA)    Difficulty of Paying Living Expenses: Not very hard  Food Insecurity: No Food Insecurity (06/11/2024)   Hunger Vital Sign    Worried About Running Out of Food in the Last Year: Never true    Ran Out of Food in the Last Year: Never true  Transportation Needs: No Transportation Needs (06/11/2024)   PRAPARE - Administrator, Civil Service (Medical): No    Lack of Transportation (Non-Medical): No  Physical Activity: Sufficiently Active (06/11/2024)   Exercise Vital Sign    Days of Exercise per Week: 5 days    Minutes of Exercise per Session: 30 min  Stress: No Stress Concern Present (06/11/2024)   [employer] of Occupational Health - Occupational Stress Questionnaire    Feeling of Stress: Only a little  Social Connections: Moderately Integrated (06/11/2024)   Social Connection and Isolation Panel    Frequency of Communication with Friends and Family: More than three times a week    Frequency of Social Gatherings with Friends and Family: Once a week    Attends Religious Services: 1 to 4 times per year    Active Member of [employer] or Organizations: No    Attends Engineer, structural: Not on file    Marital Status: Living with partner     Family History: The patient's family history includes Alcohol abuse in her brother; Arthritis in her mother; Asthma in her maternal grandfather, maternal grandmother, mother, and sister; Breast cancer (age of onset: 102) in her paternal aunt; COPD in her maternal grandfather and maternal grandmother; Cancer in her father, maternal grandmother, and paternal grandmother; Depression in her maternal grandmother and mother; Diabetes in  her father, paternal grandfather, and paternal grandmother; Early death in her father and maternal grandmother; Heart failure in her father, paternal grandfather, and another family member; Hyperlipidemia in her father, maternal grandfather, mother, paternal grandfather, and another family member; Hypertension in her brother, father, maternal grandfather, mother, and sister; Kidney disease in her mother, paternal grandfather, and another family member; Miscarriages / [place] in her sister; Osteoarthritis in her maternal grandfather and maternal grandmother.  ROS:   Please see the history of present illness.     All other systems reviewed and are negative.  EKGs/Labs/Other Studies Reviewed:    The following studies were reviewed today:  EKG Interpretation Date/Time:  Wednesday July 27 2024 08:09:03 EDT Ventricular Rate:  84 PR Interval:  142 QRS  Duration:  70 QT Interval:  376 QTC Calculation: 444 R Axis:   -4  Text Interpretation: Normal sinus rhythm Normal ECG Confirmed by Darliss Rogue ([id number]) on 07/27/2024 8:12:37 AM    Recent Labs: 01/25/2024: BUN 10; Creatinine, Ser 0.94; Potassium 4.0; Sodium 140 02/16/2024: Hemoglobin 15.2; Platelets 279.0  Recent Lipid Panel    Component Value Date/Time   CHOL 130 07/01/2024 0841   TRIG 96.0 07/01/2024 0841   HDL 30.10 (L) 07/01/2024 0841   CHOLHDL 4 07/01/2024 0841   VLDL 19.2 07/01/2024 0841   LDLCALC 81 07/01/2024 0841   LDLDIRECT 107.0 06/19/2022 0843     Risk Assessment/Calculations:          Physical Exam:    VS:  BP 130/72 (BP Location: Left Arm, Patient Position: Sitting, Cuff Size: Normal)   Pulse 84   Ht 5' 5 (1.651 m)   Wt 218 lb 3.2 oz (99 kg)   LMP 06/23/2024   SpO2 98%   BMI 36.31 kg/m     Wt Readings from Last 3 Encounters:  07/27/24 218 lb 3.2 oz (99 kg)  07/01/24 218 lb (98.9 kg)  01/25/24 222 lb (100.7 kg)     GEN:  Well nourished, well developed in no acute distress HEENT: Normal NECK:  No JVD; No carotid bruits CARDIAC: RRR, no murmurs, rubs, gallops RESPIRATORY:  Clear to auscultation without rales, wheezing or rhonchi  ABDOMEN: Soft, non-tender, non-distended MUSCULOSKELETAL:  No edema; No deformity  SKIN: Warm and dry NEUROLOGIC:  Alert and oriented x 3 PSYCHIATRIC:  Normal affect   ASSESSMENT:    1. Dyspnea, unspecified type   2. BMI 36.0-36.9,adult   3. Pre-procedural cardiovascular examination    PLAN:    In order of problems listed above:  History of dyspnea on exertion, currently resolved.  Needs stress test due to IVF procedure.  Obtain echo, obtain Lexiscan  Myoview . Morbid obesity, congratulated on weight loss.  Advised to continue low-calorie diet, increase activity.  Continue Mounjaro . Preprocedural cardiovascular exam, IVF procedure being planned.  Currently asymptomatic.  Echo and Lexiscan  Myoview  as above.  Follow-up after cardiac testing, okay to follow-up as needed.  Cardiac testing is otherwise with no significant abnormalities.  Informed Consent   Shared Decision Making/Informed Consent The risks [chest pain, shortness of breath, cardiac arrhythmias, dizziness, blood pressure fluctuations, myocardial infarction, stroke/transient ischemic attack, nausea, vomiting, allergic reaction, radiation exposure, metallic taste sensation and life-threatening complications (estimated to be 1 in 10,000)], benefits (risk stratification, diagnosing coronary artery disease, treatment guidance) and alternatives of a nuclear stress test were discussed in detail with Ms. Creek and she agrees to proceed.      Medication Adjustments/Labs and Tests Ordered: Current medicines are reviewed at length with the patient today.  Concerns regarding medicines are outlined above.  Orders Placed This Encounter  Procedures   NM Myocar Multi W/Spect W/Wall Motion / EF   EKG 12-Lead   ECHOCARDIOGRAM COMPLETE    No orders of the defined types were placed in this  encounter.    Patient Instructions  Medication Instructions:  Your physician recommends that you continue on your current medications as directed. Please refer to the Current Medication list given to you today.   *If you need a refill on your cardiac medications before your next appointment, please call your pharmacy*  Lab Work: No labs ordered today  If you have labs (blood work) drawn today and your tests are completely normal, you will receive your results only by: MyChart Message (if  you have MyChart) OR A paper copy in the mail If you have any lab test that is abnormal or we need to change your treatment, we will call you to review the results.  Testing/Procedures: Your physician has requested that you have an echocardiogram. Echocardiography is a painless test that uses sound waves to create images of your heart. It provides your doctor with information about the size and shape of your heart and how well your heart's chambers and valves are working.   You may receive an ultrasound enhancing agent through an IV if needed to better visualize your heart during the echo. This procedure takes approximately one hour.  There are no restrictions for this procedure.  This will take place at [address] [facility] [address], [place] [address]  Please note: We ask at that you not bring children with you during ultrasound (echo/ vascular) testing. Due to room size and safety concerns, children are not allowed in the ultrasound rooms during exams. Our front office staff cannot provide observation of children in our lobby area while testing is being conducted. An adult accompanying a patient to their appointment will only be allowed in the ultrasound room at the discretion of the ultrasound technician under special circumstances. We apologize for any inconvenience.   Your provider has ordered a Lexiscan / Exercise Myoview  Stress test. This will take place at [facility]. Please report to  the [facility] medical mall entrance. The volunteers at the first desk will direct you where to go.  [facility] MYOVIEW   Your provider has ordered a Stress Test with nuclear imaging. The purpose of this test is to evaluate the blood supply to your heart muscle. This procedure is referred to as a Non-Invasive Stress Test. This is because other than having an IV started in your vein, nothing is inserted or invades your body. Cardiac stress tests are done to find areas of poor blood flow to the heart by determining the extent of coronary artery disease (CAD). Some patients exercise on a treadmill, which naturally increases the blood flow to your heart, while others who are unable to walk on a treadmill due to physical limitations will have a pharmacologic/chemical stress agent called Lexiscan  . This medicine will mimic walking on a treadmill by temporarily increasing your coronary blood flow.   Please note: these test may take anywhere between 2-4 hours to complete  How to prepare for your Myoview  test:  Nothing to eat for 6 hours prior to the test No caffeine  for 24 hours prior to test No smoking 24 hours prior to test. Your medication may be taken with water.  If your doctor stopped a medication because of this test, do not take that medication. Ladies, please do not wear dresses.  Skirts or pants are appropriate. Please wear a short sleeve shirt. No perfume, cologne or lotion. Wear comfortable walking shoes. No heels!   PLEASE NOTIFY THE OFFICE AT LEAST 24 HOURS IN ADVANCE IF YOU ARE UNABLE TO KEEP YOUR APPOINTMENT.  [phone] AND  PLEASE NOTIFY NUCLEAR MEDICINE AT [facility] AT LEAST 24 HOURS IN ADVANCE IF YOU ARE UNABLE TO KEEP YOUR APPOINTMENT. [phone]   Follow-Up: At [facility], you and your health needs are our priority.  As part of our continuing mission to provide you with exceptional heart care, our providers are all part of one team.  This team includes your primary Cardiologist  (physician) and Advanced Practice Providers or APPs (Physician Assistants and Nurse Practitioners) who all work together  to provide you with the care you need, when you need it.  Your next appointment:   3 month(s)  Provider:   You may see Redell Cave, MD or one of the following Advanced Practice Providers on your designated Care Team:   Lonni Meager, NP Lesley Maffucci, PA-C Bernardino Bring, PA-C Cadence [place], PA-C Tylene Lunch, NP Barnie Hila, NP    We recommend signing up for the patient portal called MyChart.  Sign up information is provided on this After Visit Summary.  MyChart is used to connect with patients for Virtual Visits (Telemedicine).  Patients are able to view lab/test results, encounter notes, upcoming appointments, etc.  Non-urgent messages can be sent to your provider as well.   To learn more about what you can do with MyChart, go to [email or web address].   Other Instructions If echocardiogram and lexiscan  myoview  are normal, you can cancel your follow up visit with us        Signed, Redell Cave, MD  07/27/2024 10:09 AM    [facility]

## 2024-08-09 ENCOUNTER — Telehealth: Admitting: Physician Assistant

## 2024-08-09 DIAGNOSIS — B3731 Acute candidiasis of vulva and vagina: Secondary | ICD-10-CM

## 2024-08-09 MED ORDER — FLUCONAZOLE 150 MG PO TABS: ORAL_TABLET | ORAL | 0 refills | Status: DC

## 2024-08-09 NOTE — Progress Notes (Signed)
 I have spent 5 minutes in review of e-visit questionnaire, review and updating patient chart, medical decision making and response to patient.   Elsie Velma Lunger, PA-C

## 2024-08-09 NOTE — Progress Notes (Signed)

## 2024-08-11 ENCOUNTER — Other Ambulatory Visit: Payer: Self-pay | Admitting: Primary Care

## 2024-08-11 DIAGNOSIS — F411 Generalized anxiety disorder: Secondary | ICD-10-CM

## 2024-08-12 ENCOUNTER — Ambulatory Visit: Admitting: Primary Care

## 2024-08-12 ENCOUNTER — Encounter: Payer: Self-pay | Admitting: Primary Care

## 2024-08-12 VITALS — BP 118/66 | HR 85 | Temp 97.2°F | Ht 65.0 in | Wt 214.0 lb

## 2024-08-12 DIAGNOSIS — K130 Diseases of lips: Secondary | ICD-10-CM | POA: Diagnosis not present

## 2024-08-12 MED ORDER — MUPIROCIN 2 % EX OINT: 1.0000 | TOPICAL_OINTMENT | Freq: Two times a day (BID) | CUTANEOUS | 0 refills | Status: DC

## 2024-08-12 NOTE — Assessment & Plan Note (Signed)
.    Unclear etiology.  Differentials include ulcer versus infectious lesion.  Does not appear cancerous.  Treat with mupirocin  2% ointment twice daily x 1 week. Consider topical steroid if no improvement.  She will update early next week.

## 2024-08-12 NOTE — Patient Instructions (Signed)
 Apply the mupirocin  2% ointment twice daily for 1 week.  Please update me Tuesday next week if no improvement.  It was a pleasure to see you today!

## 2024-08-12 NOTE — Progress Notes (Signed)
 Subjective:    Patient ID: Kelli Cox, female    DOB: 02/09/1976, 48 y.o.   MRN: [id number]  [name] is a very pleasant 48 y.o. female with a history of type 2 diabetes, skin lesions who presents today to discuss lip lesion.   Symptom onset four days ago with a lesion to the right lower lip at the corner. She has noticed skin peeling away from the lip. She denies pain. Last night she was woken up from sleep with oozing from the lesion down her face. She's applied chapstick and Vaseline without improvement.   She denies fevers, history of HSV.   She has been using a tanning bed to prepare for her wedding. She did have a pimple to the left lower lip, used microcellular water. Nothing else new.  Her fianc does not have the symptoms.     Review of Systems  Constitutional:  Negative for fever.  Skin:  Positive for color change.       Lip lesion          Past Medical History:  Diagnosis Date   Acute non-recurrent sinusitis 10/17/2020   Acute pain of left shoulder 06/19/2023   Asthma    Chicken pox    Concussion with no loss of consciousness 08/27/2021   GAD (generalized anxiety disorder)    Leg pain, lateral, left 07/31/2022   Migraines    Mild intermittent asthma with (acute) exacerbation 10/31/2022   Pain of upper abdomen 01/23/2021   Shortness of breath 11/18/2022   Type 2 diabetes mellitus (HCC)     Social History   Socioeconomic History   Marital status: Single    Spouse name: Not on file   Number of children: Not on file   Years of education: Not on file   Highest education level: Master's degree (e.g., MA, MS, MEng, MEd, MSW, MBA)  Occupational History   Occupation: Emergency planning/management officer  Tobacco Use   Smoking status: Never   Smokeless tobacco: Never  Vaping Use   Vaping status: Never Used  Substance and Sexual Activity   Alcohol use: Yes    Comment: 1 drink every two weeks    Drug use: Never   Sexual activity: Yes    Partners: Male    Birth  control/protection: None  Other Topics Concern   Not on file  Social History Narrative   Not on file   Social Drivers of Health   Financial Resource Strain: Low Risk  (06/11/2024)   Overall Financial Resource Strain (CARDIA)    Difficulty of Paying Living Expenses: Not very hard  Food Insecurity: No Food Insecurity (06/11/2024)   Hunger Vital Sign    Worried About Running Out of Food in the Last Year: Never true    Ran Out of Food in the Last Year: Never true  Transportation Needs: No Transportation Needs (06/11/2024)   PRAPARE - Administrator, Civil Service (Medical): No    Lack of Transportation (Non-Medical): No  Physical Activity: Sufficiently Active (06/11/2024)   Exercise Vital Sign    Days of Exercise per Week: 5 days    Minutes of Exercise per Session: 30 min  Stress: No Stress Concern Present (06/11/2024)   [employer] of Occupational Health - Occupational Stress Questionnaire    Feeling of Stress: Only a little  Social Connections: Moderately Integrated (06/11/2024)   Social Connection and Isolation Panel    Frequency of Communication with Friends and Family: More than three times a  week    Frequency of Social Gatherings with Friends and Family: Once a week    Attends Religious Services: 1 to 4 times per year    Active Member of [employer] or Organizations: No    Attends Banker Meetings: Not on file    Marital Status: Living with partner  Intimate Partner Violence: Not on file    Past Surgical History:  Procedure Laterality Date   CHOLECYSTECTOMY  2008   COLONOSCOPY WITH PROPOFOL  N/A 07/05/2021   Procedure: COLONOSCOPY WITH PROPOFOL ;  Surgeon: Therisa Bi, MD;  Location: [facility] ENDOSCOPY;  Service: Gastroenterology;  Laterality: N/A;   LIPOMA RESECTION  2012   WRIST SURGERY Left    2001 and 2007    Family History  Problem Relation Age of Onset   Arthritis Mother    Asthma Mother    Depression Mother    Hyperlipidemia Mother     Hypertension Mother    Kidney disease Mother    Cancer Father    Diabetes Father    Early death Father    Hyperlipidemia Father    Hypertension Father    Heart failure Father    Asthma Sister    Hypertension Sister    Miscarriages / Stillbirths Sister    Breast cancer Paternal Aunt 58   Osteoarthritis Maternal Grandmother    Asthma Maternal Grandmother    Cancer Maternal Grandmother    COPD Maternal Grandmother    Depression Maternal Grandmother    Early death Maternal Grandmother    Osteoarthritis Maternal Grandfather    Asthma Maternal Grandfather    COPD Maternal Grandfather    Hypertension Maternal Grandfather    Hyperlipidemia Maternal Grandfather    Cancer Paternal Grandmother    Diabetes Paternal Grandmother    Diabetes Paternal Grandfather    Hyperlipidemia Paternal Grandfather    Heart failure Paternal Grandfather    Kidney disease Paternal Grandfather    Alcohol abuse Brother    Hypertension Brother    Hyperlipidemia Other    Heart failure Other    Kidney disease Other     Allergies  Allergen Reactions   Clarithromycin Nausea And Vomiting    Current Outpatient Medications on File Prior to Visit  Medication Sig Dispense Refill   albuterol  (VENTOLIN  HFA) 108 (90 Base) MCG/ACT inhaler Inhale 1-2 puffs into the lungs every 6 (six) hours as needed for wheezing or shortness of breath. 1 each 0   butalbital -aspirin -caffeine  (FIORINAL ) 50-325-40 MG capsule Take 1 capsule by mouth every 6 (six) hours as needed for headache. For headache/migraines 20 capsule 0   cetirizine  (ZYRTEC ) 10 MG tablet Take 1 tablet (10 mg total) by mouth daily. 30 tablet 11   Continuous Glucose Sensor (FREESTYLE LIBRE 3 SENSOR) MISC Place 1 sensor on the skin every 14 days. Use to check glucose continuously 6 each 1   fluconazole  (DIFLUCAN ) 150 MG tablet Take 1 tablet PO once. Repeat in 3 days if needed. 2 tablet 0   hydrOXYzine  (ATARAX ) 10 MG tablet TAKE 1-2 TABLETS (10-20 MG TOTAL) BY MOUTH  2 (TWO) TIMES DAILY AS NEEDED FOR ANXIETY. 60 tablet 0   letrozole (FEMARA) 2.5 MG tablet Take 2.5 mg by mouth daily.     levothyroxine (SYNTHROID) 25 MCG tablet Take 25 mcg by mouth daily.     Multiple Vitamins-Minerals (MULTIVITAMIN WITH MINERALS) tablet Take 1 tablet by mouth daily.     norethindrone (AYGESTIN) 5 MG tablet Take 2.5 mg by mouth 2 (two) times daily.  sertraline  (ZOLOFT ) 100 MG tablet TAKE 1 TABLET (100 MG TOTAL) BY MOUTH DAILY. FOR ANXIETY 90 tablet 0   tirzepatide  (MOUNJARO ) 15 MG/0.5ML Pen Inject 15 mg into the skin once a week. for diabetes. 6 mL 0   tiZANidine  (ZANAFLEX ) 4 MG tablet Take 4 mg by mouth 3 (three) times daily as needed.     WIXELA INHUB 100-50 MCG/ACT AEPB INHALE 1 PUFF INTO THE LUNGS TWICE A DAY 60 each 2   No current facility-administered medications on file prior to visit.    BP 118/66   Pulse 85   Temp (!) 97.2 F (36.2 C) (Temporal)   Ht 5' 5 (1.651 m)   Wt 214 lb (97.1 kg)   LMP 08/01/2024   SpO2 99%   BMI 35.61 kg/m  Objective:   Physical Exam Skin:    Comments: 4 mm oval shaped open lesion with whitish drainage to right lower lip towards the corner.   Neurological:     Mental Status: She is alert.     Physical Exam        Assessment & Plan:  Lip lesion Assessment & Plan: .  Unclear etiology.  Differentials include ulcer versus infectious lesion.  Does not appear cancerous.  Treat with mupirocin  2% ointment twice daily x 1 week. Consider topical steroid if no improvement.  She will update early next week.  Orders: -     Mupirocin ; Apply 1 Application topically 2 (two) times daily.  Dispense: 22 g; Refill: 0    Assessment and Plan Assessment & Plan         Comer MARLA Gaskins, NP      History of Present Illness

## 2024-08-14 ENCOUNTER — Ambulatory Visit
Admission: EM | Admit: 2024-08-14 | Discharge: 2024-08-14 | Disposition: A | Discharge status: Home / Self Care | Attending: Physician Assistant | Admitting: Physician Assistant

## 2024-08-14 ENCOUNTER — Encounter: Payer: Self-pay | Admitting: Emergency Medicine

## 2024-08-14 DIAGNOSIS — K130 Diseases of lips: Secondary | ICD-10-CM

## 2024-08-14 MED ORDER — TRIAMCINOLONE ACETONIDE 0.1 % MT PSTE: 1.0000 | PASTE | Freq: Two times a day (BID) | OROMUCOSAL | 0 refills | Status: DC

## 2024-08-14 MED ORDER — CEPHALEXIN 500 MG PO CAPS: 500.0000 mg | ORAL_CAPSULE | Freq: Three times a day (TID) | ORAL | 0 refills | Status: AC

## 2024-08-14 NOTE — ED Triage Notes (Signed)
 Pt has a lip lesion on the right corner of her lip. Started about a week ago. She saw her PCP and was given antibiotic cream and the area is getting worse and it burns. No h/o herpes simplex. Pt states she is getting married in 12 days and wants it cleared up.

## 2024-08-14 NOTE — ED Provider Notes (Signed)
 [facility]    CSN: [id number] Arrival date & time: 08/14/24  1137      History   Chief Complaint Chief Complaint  Patient presents with   Mouth Lesions    HPI MAURI TEMKIN is a 48 y.o. female presenting for reevaluation of a painful lesion to the right inner lower lip that has been present for the past week.  Patient showed me an image of the initial lesion which was 2 small superficial abrasions.  Within a couple of days this transitioned to a ulceration with a grayish to yellowish base.  Patient saw PCP a couple days ago and was prescribed mupirocin  ointment.  Since then she reports symptoms of gotten even worse.  She cleans the area with a Q-tip and saline solution.  States she has been cleaning away greenish drainage.  Also reports drying and crusting around the lesion.  She has no history of cold sores but does have a history of canker sores.  She denies any sort of injury to her lip and does not recall biting her lip.  She is concerned because she is getting married in 12 days and does not want to have the lip lesion at that time.  No other complaints.  HPI  Past Medical History:  Diagnosis Date   Acute non-recurrent sinusitis 10/17/2020   Acute pain of left shoulder 06/19/2023   Asthma    Chicken pox    Concussion with no loss of consciousness 08/27/2021   GAD (generalized anxiety disorder)    Leg pain, lateral, left 07/31/2022   Migraines    Mild intermittent asthma with (acute) exacerbation 10/31/2022   Pain of upper abdomen 01/23/2021   Shortness of breath 11/18/2022   Type 2 diabetes mellitus (HCC)     Patient Active Problem List   Diagnosis Date Noted   Lip lesion 08/12/2024   Class 1 obesity due to excess calories with body mass index (BMI) of 33.0 to 33.9 in adult 07/01/2023   Preventative health care 04/21/2023   Skin lesions 10/02/2022   Female infertility 06/19/2022   Family history of ovarian cancer 05/29/2021   Family history of breast  cancer 05/29/2021   Type 2 diabetes mellitus with hyperglycemia (HCC) 10/01/2020   Migraines 10/01/2020   Asthma 10/01/2020   GAD (generalized anxiety disorder) 10/01/2020    Past Surgical History:  Procedure Laterality Date   CHOLECYSTECTOMY  2008   COLONOSCOPY WITH PROPOFOL  N/A 07/05/2021   Procedure: COLONOSCOPY WITH PROPOFOL ;  Surgeon: Therisa Bi, MD;  Location: [facility] ENDOSCOPY;  Service: Gastroenterology;  Laterality: N/A;   LIPOMA RESECTION  2012   WRIST SURGERY Left    2001 and 2007    OB History     Gravida  1   Para      Term      Preterm      AB  1   Living         SAB  1   IAB      Ectopic      Multiple      Live Births               Home Medications    Prior to Admission medications   Medication Sig Start Date End Date Taking? Authorizing Provider  albuterol  (VENTOLIN  HFA) 108 (90 Base) MCG/ACT inhaler Inhale 1-2 puffs into the lungs every 6 (six) hours as needed for wheezing or shortness of breath. 12/15/22  Yes Clark, Katherine K, NP  butalbital -aspirin -caffeine  (  FIORINAL ) 50-325-40 MG capsule Take 1 capsule by mouth every 6 (six) hours as needed for headache. For headache/migraines 05/01/23  Yes Gretta Comer POUR, NP  cephALEXin  (KEFLEX ) 500 MG capsule Take 1 capsule (500 mg total) by mouth 3 (three) times daily for 7 days. 08/14/24 08/21/24 Yes Arvis Jolan NOVAK, PA-C  cetirizine  (ZYRTEC ) 10 MG tablet Take 1 tablet (10 mg total) by mouth daily. 10/10/20  Yes Hawks, Christy A, FNP  hydrOXYzine  (ATARAX ) 10 MG tablet TAKE 1-2 TABLETS (10-20 MG TOTAL) BY MOUTH 2 (TWO) TIMES DAILY AS NEEDED FOR ANXIETY. 08/12/24  Yes Clark, Katherine K, NP  letrozole (FEMARA) 2.5 MG tablet Take 2.5 mg by mouth daily. 11/05/21  Yes [provider]  levothyroxine (SYNTHROID) 25 MCG tablet Take 25 mcg by mouth daily. 07/16/23  Yes [provider]  Multiple Vitamins-Minerals (MULTIVITAMIN WITH MINERALS) tablet Take 1 tablet by mouth daily.   Yes [provider]  mupirocin  ointment (BACTROBAN ) 2 % Apply 1 Application topically 2 (two) times daily. 08/12/24  Yes Clark, Katherine K, NP  norethindrone (AYGESTIN) 5 MG tablet Take 2.5 mg by mouth 2 (two) times daily. 07/22/22  Yes [provider]  sertraline  (ZOLOFT ) 100 MG tablet TAKE 1 TABLET (100 MG TOTAL) BY MOUTH DAILY. FOR ANXIETY 06/07/24  Yes Gretta Comer POUR, NP  tirzepatide  (MOUNJARO ) 15 MG/0.5ML Pen Inject 15 mg into the skin once a week. for diabetes. 07/21/24  Yes Clark, Katherine K, NP  tiZANidine  (ZANAFLEX ) 4 MG tablet Take 4 mg by mouth 3 (three) times daily as needed. 01/05/24  Yes [provider]  triamcinolone  (KENALOG ) 0.1 % paste Use as directed 1 Application in the mouth or throat 2 (two) times daily. 08/14/24  Yes Arvis Jolan B, PA-C  WIXELA INHUB 100-50 MCG/ACT AEPB INHALE 1 PUFF INTO THE LUNGS TWICE A DAY 02/05/23  Yes Clark, Katherine K, NP  Continuous Glucose Sensor (FREESTYLE LIBRE 3 SENSOR) MISC Place 1 sensor on the skin every 14 days. Use to check glucose continuously 11/07/23   Clark, Katherine K, NP  fluconazole  (DIFLUCAN ) 150 MG tablet Take 1 tablet PO once. Repeat in 3 days if needed. 08/09/24   Gladis Elsie BROCKS, PA-C    Family History Family History  Problem Relation Age of Onset   Arthritis Mother    Asthma Mother    Depression Mother    Hyperlipidemia Mother    Hypertension Mother    Kidney disease Mother    Cancer Father    Diabetes Father    Early death Father    Hyperlipidemia Father    Hypertension Father    Heart failure Father    Asthma Sister    Hypertension Sister    Miscarriages / Stillbirths Sister    Breast cancer Paternal Aunt 66   Osteoarthritis Maternal Grandmother    Asthma Maternal Grandmother    Cancer Maternal Grandmother    COPD Maternal Grandmother    Depression Maternal Grandmother    Early death Maternal Grandmother    Osteoarthritis Maternal Grandfather    Asthma Maternal Grandfather    COPD Maternal  Grandfather    Hypertension Maternal Grandfather    Hyperlipidemia Maternal Grandfather    Cancer Paternal Grandmother    Diabetes Paternal Grandmother    Diabetes Paternal Grandfather    Hyperlipidemia Paternal Grandfather    Heart failure Paternal Grandfather    Kidney disease Paternal Grandfather    Alcohol abuse Brother    Hypertension Brother    Hyperlipidemia Other  Heart failure Other    Kidney disease Other     Social History Social History   Tobacco Use   Smoking status: Never   Smokeless tobacco: Never  Vaping Use   Vaping status: Never Used  Substance Use Topics   Alcohol use: Yes    Comment: 1 drink every two weeks    Drug use: Never     Allergies   Clarithromycin   Review of Systems Review of Systems  Constitutional:  Negative for fatigue and fever.  HENT:  Positive for mouth sores. Negative for congestion, rhinorrhea and sore throat.   Respiratory:  Negative for cough.      Physical Exam Triage Vital Signs ED Triage Vitals  Encounter Vitals Group     BP --      Girls Systolic BP Percentile --      Girls Diastolic BP Percentile --      Boys Systolic BP Percentile --      Boys Diastolic BP Percentile --      Pulse --      Resp --      Temp --      Temp src --      SpO2 --      Weight 08/14/24 1247 214 lb 1.1 oz (97.1 kg)     Height 08/14/24 1247 5' 5 (1.651 m)     Head Circumference --      Peak Flow --      Pain Score 08/14/24 1246 8     Pain Loc --      Pain Education --      Exclude from Growth Chart --    No data found.  Updated Vital Signs BP (!) 119/90 (BP Location: Left Arm)   Pulse 80   Temp 98.4 F (36.9 C) (Oral)   Resp 16   Ht 5' 5 (1.651 m)   Wt 214 lb 1.1 oz (97.1 kg)   LMP 08/01/2024   SpO2 100%   BMI 35.62 kg/m      Physical Exam Vitals and nursing note reviewed.  Constitutional:      General: She is not in acute distress.    Appearance: Normal appearance. She is not ill-appearing or toxic-appearing.   HENT:     Head: Normocephalic and atraumatic.     Nose: Nose normal.     Mouth/Throat:     Lips: Lesions present.     Mouth: Mucous membranes are moist.     Pharynx: Oropharynx is clear.     Comments: See image included in chart. There is an ulceration of the right side of lower lip with surrounding crusting, erythema and mild swelling. Eyes:     General: No scleral icterus.       Right eye: No discharge.        Left eye: No discharge.     Conjunctiva/sclera: Conjunctivae normal.  Cardiovascular:     Rate and Rhythm: Normal rate.  Pulmonary:     Effort: Pulmonary effort is normal. No respiratory distress.  Musculoskeletal:     Cervical back: Neck supple.  Skin:    General: Skin is dry.  Neurological:     General: No focal deficit present.     Mental Status: She is alert. Mental status is at baseline.     Motor: No weakness.     Gait: Gait normal.  Psychiatric:        Mood and Affect: Mood normal.        Behavior:  Behavior normal.      UC Treatments / Results  Labs (all labs ordered are listed, but only abnormal results are displayed) Labs Reviewed - No data to display  EKG   Radiology No results found.  Procedures Procedures (including critical care time)  Medications Ordered in UC Medications - No data to display  Initial Impression / Assessment and Plan / UC Course  I have reviewed the triage vital signs and the nursing notes.  Pertinent labs & imaging results that were available during my care of the patient were reviewed by me and considered in my medical decision making (see chart for details).   48 year old female presents for 1 week history of painful lesion of the right lower lip.  Has been applying mupirocin  ointment and cleaning with saline solution without relief.  See image included in chart.  Presentation consistent with infected aphthous ulcer, likely from trauma or injury such as biting the lip.  Sent a prescription for Keflex  and topical  triamcinolone  paste.  Encouraged use of ice and continuing Pearson ointment and cleaning the area with saline solution.  Advised PCP follow-up if not improving in a few days or symptoms worsen.   Final Clinical Impressions(s) / UC Diagnoses   Final diagnoses:  Lip ulcer     Discharge Instructions      - It looks like you have an infected canker sore. - I sent an antibiotic and a topical corticosteroid paste to the pharmacy.  If you can continue the mupirocin  ointment that would be a good idea as well.  If it is really bothersome you may use plain Vaseline. - May apply ice to the area to help with swelling. - Follow-up with PCP if not improving in a few more days.     ED Prescriptions     Medication Sig Dispense Auth. Provider   cephALEXin  (KEFLEX ) 500 MG capsule Take 1 capsule (500 mg total) by mouth 3 (three) times daily for 7 days. 21 capsule Arvis Huxley B, PA-C   triamcinolone  (KENALOG ) 0.1 % paste Use as directed 1 Application in the mouth or throat 2 (two) times daily. 5 g Arvis Huxley NOVAK, PA-C      PDMP not reviewed this encounter.   Arvis Huxley NOVAK, PA-C 08/14/24 1320

## 2024-08-14 NOTE — Discharge Instructions (Addendum)
-   It looks like you have an infected canker sore. - I sent an antibiotic and a topical corticosteroid paste to the pharmacy.  If you can continue the mupirocin  ointment that would be a good idea as well.  If it is really bothersome you may use plain Vaseline. - May apply ice to the area to help with swelling. - Follow-up with PCP if not improving in a few more days.

## 2024-08-15 ENCOUNTER — Other Ambulatory Visit: Payer: Self-pay | Admitting: Physician Assistant

## 2024-08-15 ENCOUNTER — Encounter
Admission: RE | Admit: 2024-08-15 | Discharge: 2024-08-15 | Disposition: A | Discharge status: Home / Self Care | Source: Ambulatory Visit | Attending: Cardiology | Admitting: Cardiology

## 2024-08-15 DIAGNOSIS — R0600 Dyspnea, unspecified: Secondary | ICD-10-CM | POA: Diagnosis present

## 2024-08-15 DIAGNOSIS — Z01810 Encounter for preprocedural cardiovascular examination: Secondary | ICD-10-CM | POA: Insufficient documentation

## 2024-08-15 LAB — NM MYOCAR MULTI W/SPECT W/WALL MOTION / EF
LV dias vol: 70 mL (ref 46–106)
LV sys vol: 21 mL (ref 3.8–5.2)
MPHR: 172 {beats}/min
Nuc Stress EF: 70 %
Peak HR: 117 {beats}/min
Percent HR: 68 %
Rest HR: 81 {beats}/min
Rest Nuclear Isotope Dose: 10.7 mCi
SDS: 0
SRS: 0
SSS: 4
ST Depression (mm): 0 mm
Stress Nuclear Isotope Dose: 33 mCi
TID: 0.94

## 2024-08-15 MED ORDER — REGADENOSON 0.4 MG/5ML IV SOLN
0.4000 mg | Freq: Once | INTRAVENOUS | Status: AC
  Administered 2024-08-15: 0.4 mg via INTRAVENOUS

## 2024-08-15 MED ORDER — TECHNETIUM TC 99M TETROFOSMIN IV KIT
33.0000 | PACK | Freq: Once | INTRAVENOUS | Status: AC | PRN
  Administered 2024-08-15: 33 via INTRAVENOUS

## 2024-08-15 MED ORDER — TECHNETIUM TC 99M TETROFOSMIN IV KIT
10.0000 | PACK | Freq: Once | INTRAVENOUS | Status: AC | PRN
  Administered 2024-08-15: 10.65 via INTRAVENOUS

## 2024-08-15 NOTE — Progress Notes (Signed)
     Maddix C Meeker presented for a nuclear stress test today.  I Lesley LITTIE Maffucci, PA-C, provided direct supervision and was present during the stress portion of the study today, which was completed without significant symptoms, immediate complications, or acute ST/T changes on ECG.  Stress imaging is pending at this time.  Preliminary ECG findings may be listed in the chart, but the stress test result will not be finalized until perfusion imaging is complete.  Lesley LITTIE Maffucci, PA-C  08/15/2024, 9:26 AM

## 2024-08-16 ENCOUNTER — Ambulatory Visit: Payer: Self-pay | Admitting: Cardiology

## 2024-08-19 ENCOUNTER — Other Ambulatory Visit: Payer: Self-pay | Admitting: Primary Care

## 2024-08-19 DIAGNOSIS — F411 Generalized anxiety disorder: Secondary | ICD-10-CM

## 2024-08-22 ENCOUNTER — Telehealth: Admitting: Family Medicine

## 2024-08-22 DIAGNOSIS — J069 Acute upper respiratory infection, unspecified: Secondary | ICD-10-CM

## 2024-08-22 MED ORDER — BENZONATATE 100 MG PO CAPS: 100.0000 mg | ORAL_CAPSULE | Freq: Three times a day (TID) | ORAL | 0 refills | Status: DC | PRN

## 2024-08-22 MED ORDER — IPRATROPIUM BROMIDE 0.03 % NA SOLN: 2.0000 | Freq: Two times a day (BID) | NASAL | 0 refills | Status: AC

## 2024-08-22 MED ORDER — AMOXICILLIN-POT CLAVULANATE 875-125 MG PO TABS
1.0000 | ORAL_TABLET | Freq: Two times a day (BID) | ORAL | 0 refills | Status: AC
Start: 2024-08-22 — End: 2024-08-29

## 2024-08-22 NOTE — Progress Notes (Signed)

## 2024-08-22 NOTE — Addendum Note (Signed)
 Addended by: MOISHE CHIQUITA HERO on: 08/22/2024 01:14 PM   Modules accepted: Orders

## 2024-08-24 ENCOUNTER — Other Ambulatory Visit: Payer: Self-pay | Admitting: Primary Care

## 2024-08-24 ENCOUNTER — Telehealth: Admitting: Physician Assistant

## 2024-08-24 DIAGNOSIS — J4521 Mild intermittent asthma with (acute) exacerbation: Secondary | ICD-10-CM

## 2024-08-24 DIAGNOSIS — J4520 Mild intermittent asthma, uncomplicated: Secondary | ICD-10-CM

## 2024-08-25 MED ORDER — ALBUTEROL SULFATE HFA 108 (90 BASE) MCG/ACT IN AERS: 1.0000 | INHALATION_SPRAY | Freq: Four times a day (QID) | RESPIRATORY_TRACT | 0 refills | Status: AC | PRN

## 2024-08-25 MED ORDER — PREDNISONE 20 MG PO TABS: 40.0000 mg | ORAL_TABLET | Freq: Every day | ORAL | 0 refills | Status: DC

## 2024-08-25 NOTE — Telephone Encounter (Signed)
LVM and sent My chart

## 2024-08-25 NOTE — Telephone Encounter (Signed)
 Patient is due for diabetes follow up in early February, this will be required prior to any further refills.  Please schedule, thank you!

## 2024-08-25 NOTE — Progress Notes (Signed)

## 2024-09-03 ENCOUNTER — Ambulatory Visit
Admission: RE | Admit: 2024-09-03 | Discharge: 2024-09-03 | Disposition: A | Discharge status: Home / Self Care | Attending: Emergency Medicine | Admitting: Emergency Medicine

## 2024-09-03 ENCOUNTER — Ambulatory Visit (INDEPENDENT_AMBULATORY_CARE_PROVIDER_SITE_OTHER)

## 2024-09-03 VITALS — BP 110/79 | HR 94 | Temp 97.7°F | Resp 18

## 2024-09-03 DIAGNOSIS — J4521 Mild intermittent asthma with (acute) exacerbation: Secondary | ICD-10-CM | POA: Diagnosis not present

## 2024-09-03 DIAGNOSIS — R058 Other specified cough: Secondary | ICD-10-CM | POA: Diagnosis not present

## 2024-09-03 DIAGNOSIS — R0602 Shortness of breath: Secondary | ICD-10-CM

## 2024-09-03 MED ORDER — CEFDINIR 300 MG PO CAPS: 300.0000 mg | ORAL_CAPSULE | Freq: Two times a day (BID) | ORAL | 0 refills | Status: AC

## 2024-09-03 MED ORDER — PREDNISONE 10 MG PO TABS: 40.0000 mg | ORAL_TABLET | Freq: Every day | ORAL | 0 refills | Status: AC

## 2024-09-03 NOTE — Discharge Instructions (Addendum)
 Follow up with your primary care provider on Monday.  Go to the emergency department if you have worsening symptoms.    Take the cefdinir and prednisone  as directed.

## 2024-09-03 NOTE — ED Provider Notes (Signed)
 [facility]    CSN: [id number] Arrival date & time: 09/03/24  1404      History   Chief Complaint Chief Complaint  Patient presents with   Wheezing    Have been fighting sinus infection for almost two weeks still coughing upThick geeen mucus and now breathing is getting hard - Entered by patient    HPI Kelli Cox is a 48 y.o. female.  Patient presents with 3-week history of congestion, productive cough, wheezing, shortness of breath.  No fever or chest pain.  She last used her albuterol  inhaler yesterday.  Her medical history includes asthma.  She had an e-visit on 08/24/2024 and was diagnosed with asthma exacerbation; she was treated with 5-day course of prednisone  and albuterol  inhaler.  She also uses Wixela daily for asthma.  She had an e-visit on 08/22/2024; diagnosed with viral URI with cough; treated with Augmentin , Tessalon  Perles, ipratropium nasal spray.  The history is provided by the patient and medical records.    Past Medical History:  Diagnosis Date   Acute non-recurrent sinusitis 10/17/2020   Acute pain of left shoulder 06/19/2023   Asthma    Chicken pox    Concussion with no loss of consciousness 08/27/2021   GAD (generalized anxiety disorder)    Leg pain, lateral, left 07/31/2022   Migraines    Mild intermittent asthma with (acute) exacerbation 10/31/2022   Pain of upper abdomen 01/23/2021   Shortness of breath 11/18/2022   Type 2 diabetes mellitus (HCC)     Patient Active Problem List   Diagnosis Date Noted   Lip lesion 08/12/2024   Class 1 obesity due to excess calories with body mass index (BMI) of 33.0 to 33.9 in adult 07/01/2023   Preventative health care 04/21/2023   Skin lesions 10/02/2022   Female infertility 06/19/2022   Family history of ovarian cancer 05/29/2021   Family history of breast cancer 05/29/2021   Type 2 diabetes mellitus with hyperglycemia (HCC) 10/01/2020   Migraines 10/01/2020   Asthma 10/01/2020   GAD (generalized  anxiety disorder) 10/01/2020    Past Surgical History:  Procedure Laterality Date   CHOLECYSTECTOMY  2008   COLONOSCOPY WITH PROPOFOL  N/A 07/05/2021   Procedure: COLONOSCOPY WITH PROPOFOL ;  Surgeon: Therisa Bi, MD;  Location: [facility] ENDOSCOPY;  Service: Gastroenterology;  Laterality: N/A;   LIPOMA RESECTION  2012   WRIST SURGERY Left    2001 and 2007    OB History     Gravida  1   Para      Term      Preterm      AB  1   Living         SAB  1   IAB      Ectopic      Multiple      Live Births               Home Medications    Prior to Admission medications   Medication Sig Start Date End Date Taking? Authorizing Provider  cefdinir (OMNICEF) 300 MG capsule Take 1 capsule (300 mg total) by mouth 2 (two) times daily for 7 days. 09/03/24 09/10/24 Yes Corlis Burnard DEL, NP  predniSONE  (DELTASONE ) 10 MG tablet Take 4 tablets (40 mg total) by mouth daily for 5 days. 09/03/24 09/08/24 Yes Corlis Burnard DEL, NP  albuterol  (VENTOLIN  HFA) 108 (90 Base) MCG/ACT inhaler Inhale 1-2 puffs into the lungs every 6 (six) hours as needed. 08/25/24   Vivienne Nest  M, PA-C  benzonatate  (TESSALON ) 100 MG capsule Take 1 capsule (100 mg total) by mouth 3 (three) times daily as needed for cough. 08/22/24   Moishe Chiquita HERO, NP  butalbital -aspirin -caffeine  (FIORINAL ) 50-325-40 MG capsule Take 1 capsule by mouth every 6 (six) hours as needed for headache. For headache/migraines 05/01/23   Gretta Comer POUR, NP  cetirizine  (ZYRTEC ) 10 MG tablet Take 1 tablet (10 mg total) by mouth daily. 10/10/20   Lavell Bari LABOR, FNP  Continuous Glucose Sensor (FREESTYLE LIBRE 3 SENSOR) MISC Place 1 sensor on the skin every 14 days. Use to check glucose continuously 11/07/23   Clark, Katherine K, NP  fluconazole  (DIFLUCAN ) 150 MG tablet Take 1 tablet PO once. Repeat in 3 days if needed. 08/09/24   Gladis Elsie BROCKS, PA-C  hydrOXYzine  (ATARAX ) 10 MG tablet TAKE 1-2 TABLETS (10-20 MG TOTAL) BY MOUTH 2 (TWO) TIMES DAILY  AS NEEDED FOR ANXIETY. 08/12/24   Clark, Katherine K, NP  ipratropium (ATROVENT ) 0.03 % nasal spray Place 2 sprays into both nostrils every 12 (twelve) hours. 08/22/24   Moishe Chiquita HERO, NP  letrozole (FEMARA) 2.5 MG tablet Take 2.5 mg by mouth daily. 11/05/21   [provider]  levothyroxine (SYNTHROID) 25 MCG tablet Take 25 mcg by mouth daily. 07/16/23   [provider]  Multiple Vitamins-Minerals (MULTIVITAMIN WITH MINERALS) tablet Take 1 tablet by mouth daily.    [provider]  mupirocin  ointment (BACTROBAN ) 2 % Apply 1 Application topically 2 (two) times daily. 08/12/24   Clark, Katherine K, NP  norethindrone (AYGESTIN) 5 MG tablet Take 2.5 mg by mouth 2 (two) times daily. 07/22/22   [provider]  sertraline  (ZOLOFT ) 100 MG tablet TAKE 1 TABLET (100 MG TOTAL) BY MOUTH DAILY. FOR ANXIETY 06/07/24   Clark, Katherine K, NP  tirzepatide  (MOUNJARO ) 15 MG/0.5ML Pen Inject 15 mg into the skin once a week. for diabetes. 07/21/24   Clark, Katherine K, NP  tiZANidine  (ZANAFLEX ) 4 MG tablet Take 4 mg by mouth 3 (three) times daily as needed. 01/05/24   [provider]  triamcinolone  (KENALOG ) 0.1 % paste Use as directed 1 Application in the mouth or throat 2 (two) times daily. 08/14/24   Arvis Jolan NOVAK, PA-C  WIXELA INHUB 100-50 MCG/ACT AEPB INHALE 1 PUFF INTO THE LUNGS TWICE A DAY 08/25/24   Gretta Comer POUR, NP    Family History Family History  Problem Relation Age of Onset   Arthritis Mother    Asthma Mother    Depression Mother    Hyperlipidemia Mother    Hypertension Mother    Kidney disease Mother    Cancer Father    Diabetes Father    Early death Father    Hyperlipidemia Father    Hypertension Father    Heart failure Father    Asthma Sister    Hypertension Sister    Miscarriages / Stillbirths Sister    Breast cancer Paternal Aunt 78   Osteoarthritis Maternal Grandmother    Asthma Maternal Grandmother    Cancer Maternal Grandmother    COPD  Maternal Grandmother    Depression Maternal Grandmother    Early death Maternal Grandmother    Osteoarthritis Maternal Grandfather    Asthma Maternal Grandfather    COPD Maternal Grandfather    Hypertension Maternal Grandfather    Hyperlipidemia Maternal Grandfather    Cancer Paternal Grandmother    Diabetes Paternal Grandmother    Diabetes Paternal Grandfather    Hyperlipidemia Paternal Actor  Heart failure Paternal Grandfather    Kidney disease Paternal Grandfather    Alcohol abuse Brother    Hypertension Brother    Hyperlipidemia Other    Heart failure Other    Kidney disease Other     Social History Social History   Tobacco Use   Smoking status: Never   Smokeless tobacco: Never  Vaping Use   Vaping status: Never Used  Substance Use Topics   Alcohol use: Yes    Comment: 1 drink every two weeks    Drug use: Never     Allergies   Clarithromycin   Review of Systems Review of Systems  Constitutional:  Negative for chills and fever.  HENT:  Positive for congestion, ear pain, postnasal drip, rhinorrhea and sinus pressure. Negative for sore throat.   Respiratory:  Positive for cough, shortness of breath and wheezing.   Cardiovascular:  Negative for chest pain and palpitations.     Physical Exam Triage Vital Signs ED Triage Vitals  Encounter Vitals Group     BP      Girls Systolic BP Percentile      Girls Diastolic BP Percentile      Boys Systolic BP Percentile      Boys Diastolic BP Percentile      Pulse      Resp      Temp      Temp src      SpO2      Weight      Height      Head Circumference      Peak Flow      Pain Score      Pain Loc      Pain Education      Exclude from Growth Chart    No data found.  Updated Vital Signs BP 110/79   Pulse 94   Temp 97.7 F (36.5 C)   Resp 18   LMP 08/05/2024   SpO2 98%   Visual Acuity Right Eye Distance:   Left Eye Distance:   Bilateral Distance:    Right Eye Near:   Left Eye Near:     Bilateral Near:     Physical Exam Constitutional:      General: She is not in acute distress. HENT:     Right Ear: Tympanic membrane normal.     Left Ear: Tympanic membrane normal.     Nose: Congestion and rhinorrhea present.     Mouth/Throat:     Mouth: Mucous membranes are moist.     Pharynx: Oropharynx is clear.  Cardiovascular:     Rate and Rhythm: Normal rate and regular rhythm.     Heart sounds: Normal heart sounds.  Pulmonary:     Effort: Pulmonary effort is normal. No respiratory distress.     Breath sounds: Normal breath sounds.  Neurological:     Mental Status: She is alert.      UC Treatments / Results  Labs (all labs ordered are listed, but only abnormal results are displayed) Labs Reviewed - No data to display  EKG   Radiology DG Chest 2 View Result Date: 09/03/2024 CLINICAL DATA:  Cough, shortness of breath. EXAM: CHEST - 2 VIEW COMPARISON:  06/18/2023. FINDINGS: Trachea is midline. Heart size normal. Lungs are clear. No pleural fluid. IMPRESSION: No acute findings. Electronically Signed   By: Newell Eke M.D.   On: 09/03/2024 15:03    Procedures Procedures (including critical care time)  Medications Ordered in UC Medications - No  data to display  Initial Impression / Assessment and Plan / UC Course  I have reviewed the triage vital signs and the nursing notes.  Pertinent labs & imaging results that were available during my care of the patient were reviewed by me and considered in my medical decision making (see chart for details).   Shortness of breath, productive cough, asthma exacerbation.  Afebrile and vital signs are stable.  O2 sat 98% on room air.  CXR negative.  Patient just completed a 5-day course of prednisone  and 7-day course of Augmentin .  Her symptoms have not improved.  Treating today with 7-day course of cefdinir and an additional 5-day course of prednisone .  Instructed her to follow-up with her PCP on Monday.  ED precautions given.   Education provided on shortness of breath and asthma.  She agrees to plan of care. Final Clinical Impressions(s) / UC Diagnoses   Final diagnoses:  Shortness of breath  Productive cough  Mild intermittent asthma with acute exacerbation     Discharge Instructions      Follow up with your primary care provider on Monday.  Go to the emergency department if you have worsening symptoms.    Take the cefdinir and prednisone  as directed.      ED Prescriptions     Medication Sig Dispense Auth. Provider   predniSONE  (DELTASONE ) 10 MG tablet Take 4 tablets (40 mg total) by mouth daily for 5 days. 20 tablet Corlis Burnard DEL, NP   cefdinir (OMNICEF) 300 MG capsule Take 1 capsule (300 mg total) by mouth 2 (two) times daily for 7 days. 14 capsule Corlis Burnard DEL, NP      PDMP not reviewed this encounter.   Corlis Burnard DEL, NP 09/03/24 [phone]

## 2024-09-03 NOTE — ED Triage Notes (Signed)
 Provider triage

## 2024-09-05 ENCOUNTER — Ambulatory Visit (HOSPITAL_COMMUNITY): Payer: Self-pay

## 2024-09-08 ENCOUNTER — Ambulatory Visit: Attending: Cardiology

## 2024-09-08 DIAGNOSIS — R0600 Dyspnea, unspecified: Secondary | ICD-10-CM

## 2024-09-08 DIAGNOSIS — Z01810 Encounter for preprocedural cardiovascular examination: Secondary | ICD-10-CM | POA: Diagnosis not present

## 2024-09-08 LAB — ECHOCARDIOGRAM COMPLETE
AR max vel: 2.15 cm2
AV Area VTI: 2.18 cm2
AV Area mean vel: 2.13 cm2
AV Mean grad: 2 mmHg
AV Peak grad: 3.8 mmHg
Ao pk vel: 0.98 m/s
Area-P 1/2: 4.06 cm2
S' Lateral: 2.5 cm

## 2024-09-13 ENCOUNTER — Ambulatory Visit
Admission: RE | Admit: 2024-09-13 | Discharge: 2024-09-13 | Disposition: A | Discharge status: Home / Self Care | Attending: Emergency Medicine | Admitting: Emergency Medicine

## 2024-09-13 VITALS — BP 106/68 | HR 98 | Temp 97.8°F | Resp 19

## 2024-09-13 DIAGNOSIS — H9203 Otalgia, bilateral: Secondary | ICD-10-CM | POA: Diagnosis not present

## 2024-09-13 DIAGNOSIS — J029 Acute pharyngitis, unspecified: Secondary | ICD-10-CM

## 2024-09-13 DIAGNOSIS — R0981 Nasal congestion: Secondary | ICD-10-CM | POA: Diagnosis not present

## 2024-09-13 LAB — POCT RAPID STREP A (OFFICE): Rapid Strep A Screen: NEGATIVE

## 2024-09-13 NOTE — Discharge Instructions (Signed)
 Your strep test is negative today.  Please schedule a follow-up appointment with your primary care provider to be seen in person.

## 2024-09-13 NOTE — ED Triage Notes (Signed)
 Patient to Urgent Care with complaints of ear pain/ nasal congestion/ drainage/ right sided ear pain/ sinus congestion.   Symptoms x1-2 weeks. Sore throat/ fever of 101 last night. Brother strep positive.   Taking tylenol  cold/ mucinex.

## 2024-09-13 NOTE — ED Provider Notes (Signed)
 Kelli Cox    CSN: [id number] Arrival date & time: 09/13/24  1146      History   Chief Complaint Chief Complaint  Patient presents with   Ear Fullness    Ear pain and dizziness with congestion - Entered by patient    HPI Kelli Cox is a 48 y.o. female.  Patient presents with 1 week history of ear pain, congestion, postnasal drip.  She reports fever of 101 last night along with sore throat.  She has been treating her symptoms with OTC cold medication but no OTC medications taken today.  No cough, shortness of breath, vomiting, diarrhea.  Patient was seen here on 09/03/2024; diagnosed with shortness of breath, productive cough, asthma exacerbation; chest x-ray negative; treated with cefdinir and prednisone .  She had an e-visit on 08/24/2024 and was diagnosed with asthma exacerbation; she was treated with 5-day course of prednisone  and albuterol  inhaler. She had an e-visit on 08/22/2024; diagnosed with viral URI with cough; treated with Augmentin , Tessalon  Perles, ipratropium nasal spray.   The history is provided by the patient and medical records.    Past Medical History:  Diagnosis Date   Acute non-recurrent sinusitis 10/17/2020   Acute pain of left shoulder 06/19/2023   Asthma    Chicken pox    Concussion with no loss of consciousness 08/27/2021   GAD (generalized anxiety disorder)    Leg pain, lateral, left 07/31/2022   Migraines    Mild intermittent asthma with (acute) exacerbation 10/31/2022   Pain of upper abdomen 01/23/2021   Shortness of breath 11/18/2022   Type 2 diabetes mellitus (HCC)     Patient Active Problem List   Diagnosis Date Noted   Lip lesion 08/12/2024   Class 1 obesity due to excess calories with body mass index (BMI) of 33.0 to 33.9 in adult 07/01/2023   Preventative health care 04/21/2023   Skin lesions 10/02/2022   Female infertility 06/19/2022   Family history of ovarian cancer 05/29/2021   Family history of breast cancer 05/29/2021    Type 2 diabetes mellitus with hyperglycemia (HCC) 10/01/2020   Migraines 10/01/2020   Asthma 10/01/2020   GAD (generalized anxiety disorder) 10/01/2020    Past Surgical History:  Procedure Laterality Date   CHOLECYSTECTOMY  2008   COLONOSCOPY WITH PROPOFOL  N/A 07/05/2021   Procedure: COLONOSCOPY WITH PROPOFOL ;  Surgeon: Therisa Bi, MD;  Location: [facility] END[facility]COPY;  Service: Gastroenterology;  Laterality: N/A;   LIPOMA RESECTION  2012   WRIST SURGERY Left    2001 and 2007    OB History     Gravida  1   Para      Term      Preterm      AB  1   Living         SAB  1   IAB      Ectopic      Multiple      Live Births               Home Medications    Prior to Admission medications   Medication Sig Start Date End Date Taking? Authorizing Provider  cetirizine  (ZYRTEC ) 10 MG tablet Take 1 tablet (10 mg total) by mouth daily. 10/10/20  Yes Hawks, Christy A, FNP  letrozole (FEMARA) 2.5 MG tablet Take 2.5 mg by mouth daily. 11/05/21  Yes [provider]  sertraline  (ZOLOFT ) 100 MG tablet TAKE 1 TABLET (100 MG TOTAL) BY MOUTH DAILY. FOR ANXIETY 06/07/24  Yes  Gretta Comer POUR, NP  tirzepatide  (MOUNJARO ) 15 MG/0.5ML Pen Inject 15 mg into the skin once a week. for diabetes. 07/21/24  Yes Clark, Katherine K, NP  albuterol  (VENTOLIN  HFA) 108 (90 Base) MCG/ACT inhaler Inhale 1-2 puffs into the lungs every 6 (six) hours as needed. Patient not taking: Reported on 09/13/2024 08/25/24   Burnette, Jennifer M, PA-C  benzonatate  (TESSALON ) 100 MG capsule Take 1 capsule (100 mg total) by mouth 3 (three) times daily as needed for cough. Patient not taking: Reported on 09/13/2024 08/22/24   Moishe Chiquita HERO, NP  butalbital -aspirin -caffeine  (FIORINAL ) 50-325-40 MG capsule Take 1 capsule by mouth every 6 (six) hours as needed for headache. For headache/migraines 05/01/23   Gretta Comer POUR, NP  Continuous Glucose Sensor (FREESTYLE LIBRE 3 SENSOR) MISC Place 1 sensor on the skin  every 14 days. Use to check glucose continuously 11/07/23   Clark, Katherine K, NP  fluconazole  (DIFLUCAN ) 150 MG tablet Take 1 tablet PO once. Repeat in 3 days if needed. Patient not taking: Reported on 09/13/2024 08/09/24   Gladis Elsie BROCKS, PA-C  hydrOXYzine  (ATARAX ) 10 MG tablet TAKE 1-2 TABLETS (10-20 MG TOTAL) BY MOUTH 2 (TWO) TIMES DAILY AS NEEDED FOR ANXIETY. 08/12/24   Clark, Katherine K, NP  ipratropium (ATROVENT ) 0.03 % nasal spray Place 2 sprays into both nostrils every 12 (twelve) hours. Patient not taking: Reported on 09/13/2024 08/22/24   Moishe Chiquita HERO, NP  levothyroxine (SYNTHROID) 25 MCG tablet Take 25 mcg by mouth daily. Patient not taking: Reported on 09/13/2024 07/16/23   [provider]  Multiple Vitamins-Minerals (MULTIVITAMIN WITH MINERALS) tablet Take 1 tablet by mouth daily.    [provider]  mupirocin  ointment (BACTROBAN ) 2 % Apply 1 Application topically 2 (two) times daily. Patient not taking: Reported on 09/13/2024 08/12/24   Clark, Katherine K, NP  norethindrone (AYGESTIN) 5 MG tablet Take 2.5 mg by mouth 2 (two) times daily. 07/22/22   [provider]  tiZANidine  (ZANAFLEX ) 4 MG tablet Take 4 mg by mouth 3 (three) times daily as needed. 01/05/24   [provider]  triamcinolone  (KENALOG ) 0.1 % paste Use as directed 1 Application in the mouth or throat 2 (two) times daily. 08/14/24   Arvis Jolan NOVAK, PA-C  WIXELA INHUB 100-50 MCG/ACT AEPB INHALE 1 PUFF INTO THE LUNGS TWICE A DAY Patient not taking: Reported on 09/13/2024 08/25/24   Gretta Comer POUR, NP    Family History Family History  Problem Relation Age of Onset   Arthritis Mother    Asthma Mother    Depression Mother    Hyperlipidemia Mother    Hypertension Mother    Kidney disease Mother    Cancer Father    Diabetes Father    Early death Father    Hyperlipidemia Father    Hypertension Father    Heart failure Father    Asthma Sister    Hypertension Sister     Miscarriages / Stillbirths Sister    Breast cancer Paternal Aunt 67   Osteoarthritis Maternal Grandmother    Asthma Maternal Grandmother    Cancer Maternal Grandmother    COPD Maternal Grandmother    Depression Maternal Grandmother    Early death Maternal Grandmother    Osteoarthritis Maternal Grandfather    Asthma Maternal Grandfather    COPD Maternal Grandfather    Hypertension Maternal Grandfather    Hyperlipidemia Maternal Grandfather    Cancer Paternal Grandmother    Diabetes Paternal Grandmother    Diabetes Paternal Actor  Hyperlipidemia Paternal Grandfather    Heart failure Paternal Grandfather    Kidney disease Paternal Grandfather    Alcohol abuse Brother    Hypertension Brother    Hyperlipidemia Other    Heart failure Other    Kidney disease Other     Social History Social History   Tobacco Use   Smoking status: Never   Smokeless tobacco: Never  Vaping Use   Vaping status: Never Used  Substance Use Topics   Alcohol use: Yes    Comment: 1 drink every two weeks    Drug use: Never     Allergies   Clarithromycin   Review of Systems Review of Systems  Constitutional:  Positive for fever. Negative for chills.  HENT:  Positive for congestion, ear pain, postnasal drip and sore throat.   Respiratory:  Negative for cough and shortness of breath.   Gastrointestinal:  Negative for diarrhea and vomiting.     Physical Exam Triage Vital Signs ED Triage Vitals  Encounter Vitals Group     BP 09/13/24 1200 106/68     Girls Systolic BP Percentile --      Girls Diastolic BP Percentile --      Boys Systolic BP Percentile --      Boys Diastolic BP Percentile --      Pulse Rate 09/13/24 1200 98     Resp 09/13/24 1200 19     Temp 09/13/24 1200 97.8 F (36.6 C)     Temp src --      SpO2 09/13/24 1200 98 %     Weight --      Height --      Head Circumference --      Peak Flow --      Pain Score 09/13/24 1151 9     Pain Loc --      Pain Education --       Exclude from Growth Chart --    No data found.  Updated Vital Signs BP 106/68   Pulse 98   Temp 97.8 F (36.6 C)   Resp 19   LMP 08/20/2024   SpO2 98%   Visual Acuity Right Eye Distance:   Left Eye Distance:   Bilateral Distance:    Right Eye Near:   Left Eye Near:    Bilateral Near:     Physical Exam Constitutional:      General: She is not in acute distress. HENT:     Right Ear: Tympanic membrane normal.     Left Ear: Tympanic membrane normal.     Nose: Nose normal.     Mouth/Throat:     Mouth: Mucous membranes are moist.     Pharynx: Oropharynx is clear.  Cardiovascular:     Rate and Rhythm: Normal rate and regular rhythm.     Heart sounds: Normal heart sounds.  Pulmonary:     Effort: Pulmonary effort is normal. No respiratory distress.     Breath sounds: Normal breath sounds. No wheezing.  Neurological:     Mental Status: She is alert.      UC Treatments / Results  Labs (all labs ordered are listed, but only abnormal results are displayed) Labs Reviewed  POCT RAPID STREP A (OFFICE)    EKG   Radiology No results found.  Procedures Procedures (including critical care time)  Medications Ordered in UC Medications - No data to display  Initial Impression / Assessment and Plan / UC Course  I have reviewed the triage vital  signs and the nursing notes.  Pertinent labs & imaging results that were available during my care of the patient were reviewed by me and considered in my medical decision making (see chart for details).    Viral pharyngitis, bilateral otalgia, nasal congestion.  Afebrile and vital signs are stable.  Lungs are clear and O2 sat is 98% on room air.  Patient has not taken any OTC medications today.  Rapid strep negative.  Discussed symptomatic treatment and instructed patient to schedule an in person visit with her PCP due to her ongoing symptoms requiring multiple telehealth and urgent care visits.  She agrees to plan of  care.  Final Clinical Impressions(s) / UC Diagnoses   Final diagnoses:  Viral pharyngitis  Otalgia of both ears  Nasal congestion     Discharge Instructions      Your strep test is negative today.  Please schedule a follow-up appointment with your primary care provider to be seen in person.     ED Prescriptions   None    PDMP not reviewed this encounter.   Corlis Burnard DEL, NP 09/13/24 1230

## 2024-09-14 ENCOUNTER — Ambulatory Visit: Admitting: Nurse Practitioner

## 2024-09-19 ENCOUNTER — Ambulatory Visit: Admitting: Primary Care

## 2024-09-20 ENCOUNTER — Ambulatory Visit
Admission: RE | Admit: 2024-09-20 | Discharge: 2024-09-20 | Disposition: A | Source: Ambulatory Visit | Attending: Primary Care

## 2024-09-20 DIAGNOSIS — Z1231 Encounter for screening mammogram for malignant neoplasm of breast: Secondary | ICD-10-CM

## 2024-09-23 ENCOUNTER — Encounter: Payer: Self-pay | Admitting: Family Medicine

## 2024-09-23 ENCOUNTER — Ambulatory Visit: Admitting: Family Medicine

## 2024-09-23 ENCOUNTER — Ambulatory Visit: Payer: Self-pay

## 2024-09-23 VITALS — BP 106/86 | HR 94 | Temp 99.4°F | Ht 65.0 in | Wt 216.2 lb

## 2024-09-23 DIAGNOSIS — J330 Polyp of nasal cavity: Secondary | ICD-10-CM | POA: Diagnosis not present

## 2024-09-23 DIAGNOSIS — J4520 Mild intermittent asthma, uncomplicated: Secondary | ICD-10-CM | POA: Diagnosis not present

## 2024-09-23 DIAGNOSIS — B3731 Acute candidiasis of vulva and vagina: Secondary | ICD-10-CM

## 2024-09-23 DIAGNOSIS — J0111 Acute recurrent frontal sinusitis: Secondary | ICD-10-CM | POA: Insufficient documentation

## 2024-09-23 MED ORDER — FLUTICASONE PROPIONATE 50 MCG/ACT NA SUSP: 2.0000 | Freq: Every day | NASAL | 6 refills | Status: AC

## 2024-09-23 MED ORDER — FLUCONAZOLE 150 MG PO TABS: ORAL_TABLET | ORAL | 0 refills | Status: DC

## 2024-09-23 MED ORDER — FLUTICASONE-SALMETEROL 100-50 MCG/ACT IN AEPB: 1.0000 | INHALATION_SPRAY | Freq: Two times a day (BID) | RESPIRATORY_TRACT | 3 refills | Status: AC

## 2024-09-23 MED ORDER — AMOXICILLIN-POT CLAVULANATE 875-125 MG PO TABS: 1.0000 | ORAL_TABLET | Freq: Two times a day (BID) | ORAL | 0 refills | Status: AC

## 2024-09-23 NOTE — Telephone Encounter (Signed)
 FYI Only or Action Required?: FYI only for provider.  Patient was last seen in primary care on 08/12/2024 by Gretta Comer POUR, NP.  Called Nurse Triage reporting Dizziness.  Symptoms began several days ago.  Interventions attempted: Nothing.  Symptoms are: unchanged.  Triage Disposition: See PCP When Office is Open (Within 3 Days)  Patient/caregiver understands and will follow disposition?: Yes          Copied from CRM [id number]. Topic: Clinical - Red Word Triage >> Sep 23, 2024  7:55 AM Harlene ORN wrote: Red Word that prompted transfer to Nurse Triage: last night started getting dizzy again Reason for Disposition  [1] MILD dizziness (e.g., walking normally) AND [2] has NOT been evaluated by doctor (or NP/PA) for this  (Exception: Dizziness caused by heat exposure, sudden standing, or poor fluid intake.)  [1] Ear congestion lasts > 3 days AND [2] no improvement after using Care Advice  (Exception: Ear congestion is a chronic symptom.)    Pt c/o URI sx leading to ear congestion x a few days now. Pt reports that ear congestion causing intermittent dizziness with change in head position.  Answer Assessment - Initial Assessment Questions 1. DESCRIPTION: Describe your dizziness.     Dizzy spells 2. LIGHTHEADED: Do you feel lightheaded? (e.g., somewhat faint, woozy, weak upon standing)     woozy 3. VERTIGO: Do you feel like either you or the room is spinning or tilting? (i.e., vertigo)     denies 4. SEVERITY: How bad is it?  Do you feel like you are going to faint? Can you stand and walk?     Yes is able to walk 5. ONSET:  When did the dizziness begin?     X1 week 6. AGGRAVATING FACTORS: Does anything make it worse? (e.g., standing, change in head position)     Bending over, walking. 7. HEART RATE: Can you tell me your heart rate? How many beats in 15 seconds?  (Note: Not all patients can do this.)       N/a 8. CAUSE: What do you think is causing the  dizziness? (e.g., decreased fluids or food, diarrhea, emotional distress, heat exposure, new medicine, sudden standing, vomiting; unknown)     Inner ear issue 9. RECURRENT SYMPTOM: Have you had dizziness before? If Yes, ask: When was the last time? What happened that time?     *No Answer* 10. OTHER SYMPTOMS: Do you have any other symptoms? (e.g., fever, chest pain, vomiting, diarrhea, bleeding)       Nasal drainage, ear congestion 11. PREGNANCY: Is there any chance you are pregnant? When was your last menstrual period?       N/a  Protocols used: Dizziness - Lightheadedness-A-AH, Ear - Congestion-A-AH

## 2024-09-23 NOTE — Patient Instructions (Signed)
 VISIT SUMMARY: Today, you were seen for dizziness and purulent nasal drainage. You have a history of sinus infections and asthma, and recently experienced a flare-up of sinusitis with symptoms including headaches, pressure behind your right ear, and dizziness. Your asthma is currently well-controlled, and we addressed an issue with your [facility] prescription. Your blood pressure was elevated today, likely due to your acute illness.  YOUR PLAN: -ACUTE RIGHT-SIDED SINUSITIS WITH NASAL POLYP: You have a sinus infection on the right side, which is causing inflammation. We have prescribed Augmentin  for 10 days and Flonase  nasal spray to help reduce the inflammation. Please increase your fluid intake, get plenty of rest, and you may use acetaminophen  for discomfort. Continue using guaifenesin and Nyquil as needed. If your symptoms do not improve, we may refer you to an ENT specialist.  -DIZZINESS AND VERTIGO: Your dizziness is likely related to your sinusitis and ear issues. We will treat the underlying sinusitis to help alleviate your dizziness. If your symptoms persist, we will reassess.  -ASTHMA: Your asthma is well-controlled at the moment. We have resolved the issue with your [facility] prescription, and you should continue using your albuterol  inhaler as needed. Consider start flonase  nasal steroid preventatively during allergy season.   -ELEVATED BLOOD PRESSURE: Your blood pressure was elevated today, which is likely due to your acute illness. Normally, your blood pressure is around 120/80 mmHg. We will monitor your blood pressure and recommend you avoid decongestants that may raise it.  INSTRUCTIONS: Please take Augmentin  for 10 days as prescribed and use Flonase  nasal spray every morning. Monitor your blood pressure and avoid decongestants. If your symptoms do not improve, we may refer you to an ENT specialist. Continue using your asthma medications as directed. Follow up with us  if you have any concerns  or if your symptoms persist.

## 2024-09-23 NOTE — Progress Notes (Signed)
 Ph: [phone] Fax: [phone]   Patient ID: Kelli Cox, female    DOB: 1976-09-15, 48 y.o.   MRN: [id number]  This visit was conducted in person.  BP 106/86 (BP Location: Right Arm, Cuff Size: Large)   Pulse 94   Temp 99.4 F (37.4 C) (Oral)   Ht 5' 5 (1.651 m)   Wt 216 lb 4 oz (98.1 kg)   LMP 08/20/2024   SpO2 97%   BMI 35.99 kg/m    Chief Complaint  Patient presents with   Dizziness    Pt CC dizziness/ear. Started 08/26/24. Pt states she has green/yellow dc from nose. Dizziness has gotten worse since this past week    Subjective:   Discussed the use of AI scribe software for clinical note transcription with the patient, who gave verbal consent to proceed.  History of Present Illness   Kelli Cox is a 48 year old female who presents with dizziness and purulent nasal drainage.  She experiences dizziness and purulent nasal drainage. A sinus infection started during her wedding at end of September, followed by bronchitis on her honeymoon. On October 4th, she visited urgent care for right ear pain and was advised to use nasal spray and Mucinex. Symptoms improved but recurred four days ago, including drainage, headaches, and pressure behind her right ear.   Asthma is managed with Wixela and a PRN albuterol  inhaler, which she has not needed this week. She has had difficulty obtaining Wixela due to transmission issues. In September, she completed a seven-day course of Augmentin  and two courses of prednisone  for an asthma flare. On October 4th, she was prescribed cefdinir for seven days. These treatments helped initialy, but drainage has recurred. Recent symptoms include dizziness, nausea, and headaches.  Dizziness is described as feeling unsteady with position changes, without blacking out. Greenish nasal discharge is primarily from the right side, accompanied by coughing up mucus. No tooth pain, chest tightness, wheezing, fever, or chills. Past medical history includes a  concussion in 2022 with constant ringing in her left ear. She uses Mucinex and Nyquil for symptom management and requests Diflucan  for antibiotic-related loose infections.          Relevant past medical, surgical, family and social history reviewed and updated as indicated. Interim medical history since our last visit reviewed. Allergies and medications reviewed and updated. Outpatient Medications Prior to Visit  Medication Sig Dispense Refill   albuterol  (VENTOLIN  HFA) 108 (90 Base) MCG/ACT inhaler Inhale 1-2 puffs into the lungs every 6 (six) hours as needed. 8 g 0   butalbital -aspirin -caffeine  (FIORINAL ) 50-325-40 MG capsule Take 1 capsule by mouth every 6 (six) hours as needed for headache. For headache/migraines 20 capsule 0   cetirizine  (ZYRTEC ) 10 MG tablet Take 1 tablet (10 mg total) by mouth daily. 30 tablet 11   Continuous Glucose Sensor (FREESTYLE LIBRE 3 SENSOR) MISC Place 1 sensor on the skin every 14 days. Use to check glucose continuously 6 each 1   hydrOXYzine  (ATARAX ) 10 MG tablet TAKE 1-2 TABLETS (10-20 MG TOTAL) BY MOUTH 2 (TWO) TIMES DAILY AS NEEDED FOR ANXIETY. 60 tablet 0   letrozole (FEMARA) 2.5 MG tablet Take 2.5 mg by mouth daily.     Multiple Vitamins-Minerals (MULTIVITAMIN WITH MINERALS) tablet Take 1 tablet by mouth daily.     norethindrone (AYGESTIN) 5 MG tablet Take 2.5 mg by mouth 2 (two) times daily.     sertraline  (ZOLOFT ) 100 MG tablet TAKE 1 TABLET (100 MG TOTAL) BY  MOUTH DAILY. FOR ANXIETY 90 tablet 0   tirzepatide  (MOUNJARO ) 15 MG/0.5ML Pen Inject 15 mg into the skin once a week. for diabetes. 6 mL 0   tiZANidine  (ZANAFLEX ) 4 MG tablet Take 4 mg by mouth 3 (three) times daily as needed.     WIXELA INHUB 100-50 MCG/ACT AEPB INHALE 1 PUFF INTO THE LUNGS TWICE A DAY 60 each 2   benzonatate  (TESSALON ) 100 MG capsule Take 1 capsule (100 mg total) by mouth 3 (three) times daily as needed for cough. (Patient not taking: Reported on 09/23/2024) 30 capsule 0    ipratropium (ATROVENT ) 0.03 % nasal spray Place 2 sprays into both nostrils every 12 (twelve) hours. (Patient not taking: Reported on 09/23/2024) 30 mL 0   levothyroxine (SYNTHROID) 25 MCG tablet Take 25 mcg by mouth daily. (Patient not taking: Reported on 09/23/2024)     mupirocin  ointment (BACTROBAN ) 2 % Apply 1 Application topically 2 (two) times daily. (Patient not taking: Reported on 09/23/2024) 22 g 0   triamcinolone  (KENALOG ) 0.1 % paste Use as directed 1 Application in the mouth or throat 2 (two) times daily. (Patient not taking: Reported on 09/23/2024) 5 g 0   fluconazole  (DIFLUCAN ) 150 MG tablet Take 1 tablet PO once. Repeat in 3 days if needed. (Patient not taking: Reported on 09/23/2024) 2 tablet 0   No facility-administered medications prior to visit.     Per HPI unless specifically indicated in ROS section below Review of Systems  Objective:  BP 106/86 (BP Location: Right Arm, Cuff Size: Large)   Pulse 94   Temp 99.4 F (37.4 C) (Oral)   Ht 5' 5 (1.651 m)   Wt 216 lb 4 oz (98.1 kg)   LMP 08/20/2024   SpO2 97%   BMI 35.99 kg/m   Wt Readings from Last 3 Encounters:  09/23/24 216 lb 4 oz (98.1 kg)  08/14/24 214 lb 1.1 oz (97.1 kg)  08/12/24 214 lb (97.1 kg)      Physical Exam   VITALS: BP- 106/86 HEENT: Right ear tender on palpation, no infection. Nasal polyp on right side. Right frontal sinus tenderness. CHEST: Lungs clear to auscultation, no wheezing.       Physical Exam Vitals and nursing note reviewed.  Constitutional:      Appearance: Normal appearance. She is not ill-appearing.  HENT:     Head: Normocephalic and atraumatic.     Right Ear: Tympanic membrane, ear canal and external ear normal. There is no impacted cerumen.     Left Ear: Tympanic membrane, ear canal and external ear normal. There is no impacted cerumen.     Nose: Mucosal edema and congestion present. No rhinorrhea.     Right Turbinates: Not enlarged or swollen.     Left Turbinates: Not  enlarged or swollen.     Right Sinus: Frontal sinus tenderness present. No maxillary sinus tenderness.     Left Sinus: No maxillary sinus tenderness or frontal sinus tenderness.     Comments: R nasal polyp present    Mouth/Throat:     Mouth: Mucous membranes are moist.     Pharynx: Oropharynx is clear. No oropharyngeal exudate or posterior oropharyngeal erythema.  Eyes:     Extraocular Movements: Extraocular movements intact.     Conjunctiva/sclera: Conjunctivae normal.     Pupils: Pupils are equal, round, and reactive to light.  Cardiovascular:     Rate and Rhythm: Normal rate and regular rhythm.     Pulses: Normal pulses.  Heart sounds: Normal heart sounds. No murmur heard. Pulmonary:     Effort: Pulmonary effort is normal. No respiratory distress.     Breath sounds: Normal breath sounds. No wheezing, rhonchi or rales.  Musculoskeletal:     Cervical back: Normal range of motion and neck supple.  Lymphadenopathy:     Head:     Right side of head: No submental, submandibular, tonsillar, preauricular or posterior auricular adenopathy.     Left side of head: No submental, submandibular, tonsillar, preauricular or posterior auricular adenopathy.     Cervical: No cervical adenopathy.     Right cervical: No superficial cervical adenopathy.    Left cervical: No superficial cervical adenopathy.     Upper Body:     Right upper body: No supraclavicular adenopathy.     Left upper body: No supraclavicular adenopathy.  Skin:    Findings: No rash.  Neurological:     Mental Status: She is alert.  Psychiatric:        Mood and Affect: Mood normal.        Behavior: Behavior normal.       Results           Results for orders placed or performed during the hospital encounter of 09/13/24  POCT rapid strep A   Collection Time: 09/13/24 12:09 PM  Result Value Ref Range   Rapid Strep A Screen Negative Negative   Lab Results  Component Value Date   HGBA1C 5.5 07/01/2024     Assessment & Plan:       Acute right-sided sinusitis with nasal polyp Recurrent sinusitis with acute flare on the right side. Sinus inflammation may be causing dizziness. No need for systemic steroids at this time. - Prescribe Augmentin  for 10 days. - Prescribe Flonase , 2 sprays in each nostril every morning. - Encourage increased fluid intake and rest. - Allow use of acetaminophen  for discomfort. - Continue guaifenesin and Nyquil as needed. - Consider ENT referral if symptoms do not improve.  Dizziness and vertigo Dizziness likely related to sinusitis and ear issues. No vertigo or syncope reported. - Treat underlying sinusitis to alleviate dizziness. - Reassess if symptoms persist.  Asthma Asthma well-controlled. Wixela prescription issue resolved. - Refill Wixela prescription. - Continue albuterol  as needed.  Elevated blood pressure Elevated due to acute illness. Normal blood pressure typically 120/80 mmHg. - On recheck, BP back to normal.  - Monitor blood pressure. - Avoid decongestants that may raise blood pressure.  Recording duration: 14 minutes       Problem List Items Addressed This Visit     Asthma   Relevant Medications   fluticasone -salmeterol (WIXELA INHUB) 100-50 MCG/ACT AEPB   Acute recurrent frontal sinusitis - Primary   Anticipate recurrent issue - see above.  ERx diflucan  WASP in case develops yeast infection after abx.      Relevant Medications   fluticasone  (FLONASE ) 50 MCG/ACT nasal spray   amoxicillin -clavulanate (AUGMENTIN ) 875-125 MG tablet   fluconazole  (DIFLUCAN ) 150 MG tablet   Polyp of right nasal cavity   Other Visit Diagnoses       Yeast vaginitis       Relevant Medications   fluconazole  (DIFLUCAN ) 150 MG tablet        Meds ordered this encounter  Medications   fluticasone -salmeterol (WIXELA INHUB) 100-50 MCG/ACT AEPB    Sig: Inhale 1 puff into the lungs 2 (two) times daily.    Dispense:  60 each    Refill:  3    fluticasone  (  FLONASE ) 50 MCG/ACT nasal spray    Sig: Place 2 sprays into both nostrils daily.    Dispense:  16 g    Refill:  6   amoxicillin -clavulanate (AUGMENTIN ) 875-125 MG tablet    Sig: Take 1 tablet by mouth 2 (two) times daily for 10 days.    Dispense:  20 tablet    Refill:  0   fluconazole  (DIFLUCAN ) 150 MG tablet    Sig: Take 1 tablet PO once. Repeat in 4 days if needed.    Dispense:  2 tablet    Refill:  0    No orders of the defined types were placed in this encounter.   Patient Instructions  VISIT SUMMARY: Today, you were seen for dizziness and purulent nasal drainage. You have a history of sinus infections and asthma, and recently experienced a flare-up of sinusitis with symptoms including headaches, pressure behind your right ear, and dizziness. Your asthma is currently well-controlled, and we addressed an issue with your [facility] prescription. Your blood pressure was elevated today, likely due to your acute illness.  YOUR PLAN: -ACUTE RIGHT-SIDED SINUSITIS WITH NASAL POLYP: You have a sinus infection on the right side, which is causing inflammation. We have prescribed Augmentin  for 10 days and Flonase  nasal spray to help reduce the inflammation. Please increase your fluid intake, get plenty of rest, and you may use acetaminophen  for discomfort. Continue using guaifenesin and Nyquil as needed. If your symptoms do not improve, we may refer you to an ENT specialist.  -DIZZINESS AND VERTIGO: Your dizziness is likely related to your sinusitis and ear issues. We will treat the underlying sinusitis to help alleviate your dizziness. If your symptoms persist, we will reassess.  -ASTHMA: Your asthma is well-controlled at the moment. We have resolved the issue with your [facility] prescription, and you should continue using your albuterol  inhaler as needed. Consider start flonase  nasal steroid preventatively during allergy season.   -ELEVATED BLOOD PRESSURE: Your blood pressure was elevated  today, which is likely due to your acute illness. Normally, your blood pressure is around 120/80 mmHg. We will monitor your blood pressure and recommend you avoid decongestants that may raise it.  INSTRUCTIONS: Please take Augmentin  for 10 days as prescribed and use Flonase  nasal spray every morning. Monitor your blood pressure and avoid decongestants. If your symptoms do not improve, we may refer you to an ENT specialist. Continue using your asthma medications as directed. Follow up with us  if you have any concerns or if your symptoms persist.  Follow up plan: Return if symptoms worsen or fail to improve.  Anton Blas, MD

## 2024-09-23 NOTE — Telephone Encounter (Signed)
 Seen today.

## 2024-09-23 NOTE — Assessment & Plan Note (Addendum)
 Anticipate recurrent issue - see above.  ERx diflucan  WASP in case develops yeast infection after abx.

## 2024-09-27 ENCOUNTER — Other Ambulatory Visit: Payer: Self-pay | Admitting: Primary Care

## 2024-09-27 DIAGNOSIS — F411 Generalized anxiety disorder: Secondary | ICD-10-CM

## 2024-10-04 ENCOUNTER — Other Ambulatory Visit: Payer: Self-pay | Admitting: Primary Care

## 2024-10-04 DIAGNOSIS — E1165 Type 2 diabetes mellitus with hyperglycemia: Secondary | ICD-10-CM

## 2024-10-05 ENCOUNTER — Ambulatory Visit: Payer: Self-pay

## 2024-10-05 ENCOUNTER — Telehealth: Admitting: Family Medicine

## 2024-10-05 DIAGNOSIS — K130 Diseases of lips: Secondary | ICD-10-CM

## 2024-10-05 NOTE — Telephone Encounter (Signed)
 NT has made 3 attempts to call the patient without success. Messages have been left for the patient to call back. Routing to clinic.

## 2024-10-05 NOTE — Telephone Encounter (Signed)
 FYI Only or Action Required?: FYI only for provider: appointment scheduled on 11/7.  Patient was last seen in primary care on 09/23/2024 by Rilla Baller, MD.  Called Nurse Triage reporting Mouth Lesions.  Symptoms began yesterday.  Interventions attempted: Nothing.  Symptoms are: gradually worsening.  Triage Disposition: Home Care, No Contact Calls  Patient/caregiver understands and will follow disposition?: Appointment scheduled, patient reports complications in the past with similar symptoms       Message from [facility] S sent at 10/05/2024  3:05 PM EST  Reason for Triage: pt has sore on mouth that disappeared then showed up in another place in her mouth. Call back number [phone]       Reason for Disposition  Canker sore(s) suspected (e.g., 1 to 3 painful white shallow ulcers; no fever, no recent cancer treatment)    Appointment scheduled, patient reports complications in the past with similar symptoms  Answer Assessment - Initial Assessment Questions 1. LOCATION: Where is the mouth sore (ulcer) located?      On upper lip in front  2. NUMBER: How many sores are there? :     One, but one is starting in the corner of her mouth  3. SIZE: How large is the sore?  (e.g., size of an apple seed, watermelon seed, pencil eraser)     About 1cm at the most  4. PAIN: Are they painful? If Yes, ask: How bad is it?  (Scale 0-10; or none, mild, moderate, severe)     5/10, but is 8/10 when eating or touching  5. ONSET: When did you first notice the sore?      Yesterday  6. RECURRENT SYMPTOM: Have you had a mouth ulcer before? If Yes, ask: When was the last time? and What happened that time?      Yes 7. CAUSE: What do you think is causing the mouth sore?     Unsure  8. OTHER SYMPTOMS: Do you have any other symptoms? (e.g., fever, swollen lymph node)     No  Protocols used: Mouth Ulcers-A-AH

## 2024-10-05 NOTE — Progress Notes (Signed)
  Because given the recurrence, on going condition, we feel your condition warrants further evaluation and recommend that you be seen in a face-to-face visit at your local urgent care or pcp office.   NOTE: There will be NO CHARGE for this E-Visit   If you are having a true medical emergency, please call 911.

## 2024-10-05 NOTE — Telephone Encounter (Signed)
 This RN attempted to contact pt. No answer, left VM with call back number.    Message from [place] S sent at 10/05/2024  3:05 PM EST  Reason for Triage: pt has sore on mouth that disappeared then showed up in another place in her mouth. Call back number [phone]

## 2024-10-06 ENCOUNTER — Other Ambulatory Visit: Payer: Self-pay | Admitting: Primary Care

## 2024-10-06 DIAGNOSIS — F411 Generalized anxiety disorder: Secondary | ICD-10-CM

## 2024-10-07 ENCOUNTER — Ambulatory Visit: Admitting: Primary Care

## 2024-10-07 ENCOUNTER — Encounter: Payer: Self-pay | Admitting: Primary Care

## 2024-10-07 VITALS — BP 136/88 | HR 94 | Temp 98.3°F | Ht 65.0 in | Wt 218.2 lb

## 2024-10-07 DIAGNOSIS — K130 Diseases of lips: Secondary | ICD-10-CM

## 2024-10-07 MED ORDER — TRIAMCINOLONE ACETONIDE 0.1 % MT PSTE: 1.0000 | PASTE | Freq: Two times a day (BID) | OROMUCOSAL | 0 refills | Status: AC

## 2024-10-07 NOTE — Patient Instructions (Addendum)
 Apply the paste twice daily until resolved.  Try to avoid biting your lip.  It was a pleasure to see you today!

## 2024-10-07 NOTE — Progress Notes (Signed)
 Subjective:    Patient ID: Kelli Cox, female    DOB: [birth year], 48 y.o.   MRN: [id number]  Kelli Cox is a very pleasant 48 y.o. female with a history of migraines, asthma, type 2 diabetes, lip lesion who presents today to discuss  Evaluated on 08/12/2024 for a 4-day history of lip lesion to the right lower lip at the corner.  The etiology was unclear but she was treated with mupirocin  2% ointment twice daily x 1 week. Today she discusses that the lesion healed completely from last visit.   Her most recent lesion began about 3 days ago and is located to the right middle upper inner lip. Her lip lesion burns, has developed a mucous pocket which burst today.  Recently had a sinus infection for which was treated.  Her lip lesion began thereafter  She believes her lesions are stress related as she's been told that she's biting her lip often. She does have a new puppy that does lick her lips. She's been under a tremendous amount of stress at work and home. She's tried mupirocin  without improvement. She denies a history of HSV.   Review of Systems  Constitutional:  Negative for fever.  HENT:  Negative for congestion and sinus pressure.   Skin:        Lip lesion         Past Medical History:  Diagnosis Date   Acute non-recurrent sinusitis 10/17/2020   Acute pain of left shoulder 06/19/2023   Asthma    Chicken pox    Concussion with no loss of consciousness 08/27/2021   GAD (generalized anxiety disorder)    Leg pain, lateral, left 07/31/2022   Migraines    Mild intermittent asthma with (acute) exacerbation 10/31/2022   Pain of upper abdomen 01/23/2021   Shortness of breath 11/18/2022   Type 2 diabetes mellitus (HCC)     Social History   Socioeconomic History   Marital status: Single    Spouse name: Not on file   Number of children: Not on file   Years of education: Not on file   Highest education level: Master's degree (e.g., MA, MS, MEng, MEd, MSW, MBA)  Occupational  History   Occupation: Emergency Planning/management Officer  Tobacco Use   Smoking status: Never   Smokeless tobacco: Never  Vaping Use   Vaping status: Never Used  Substance and Sexual Activity   Alcohol use: Yes    Comment: 1 drink every two weeks    Drug use: Never   Sexual activity: Yes    Partners: Male    Birth control/protection: None  Other Topics Concern   Not on file  Social History Narrative   Not on file   Social Drivers of Health   Financial Resource Strain: Low Risk  (06/11/2024)   Overall Financial Resource Strain (CARDIA)    Difficulty of Paying Living Expenses: Not very hard  Food Insecurity: No Food Insecurity (06/11/2024)   Hunger Vital Sign    Worried About Running Out of Food in the Last Year: Never true    Ran Out of Food in the Last Year: Never true  Transportation Needs: No Transportation Needs (06/11/2024)   PRAPARE - Administrator, Civil Service (Medical): No    Lack of Transportation (Non-Medical): No  Physical Activity: Sufficiently Active (06/11/2024)   Exercise Vital Sign    Days of Exercise per Week: 5 days    Minutes of Exercise per Session: 30 min  Stress:  No Stress Concern Present (06/11/2024)   [employer] of Occupational Health - Occupational Stress Questionnaire    Feeling of Stress: Only a little  Social Connections: Moderately Integrated (06/11/2024)   Social Connection and Isolation Panel    Frequency of Communication with Friends and Family: More than three times a week    Frequency of Social Gatherings with Friends and Family: Once a week    Attends Religious Services: 1 to 4 times per year    Active Member of [employer] or Organizations: No    Attends Banker Meetings: Not on file    Marital Status: Living with partner  Intimate Partner Violence: Not on file    Past Surgical History:  Procedure Laterality Date   CHOLECYSTECTOMY  2008   COLONOSCOPY WITH PROPOFOL  N/A 07/05/2021   Procedure: COLONOSCOPY WITH PROPOFOL ;   Surgeon: Therisa Bi, MD;  Location: [facility] ENDOSCOPY;  Service: Gastroenterology;  Laterality: N/A;   FRACTURE SURGERY  2001   Left wrist   LIPOMA RESECTION  2012   WRIST SURGERY Left    2001 and 2007    Family History  Problem Relation Age of Onset   Arthritis Mother    Asthma Mother    Depression Mother    Hyperlipidemia Mother    Hypertension Mother    Kidney disease Mother    Cancer Father    Diabetes Father    Early death Father    Hyperlipidemia Father    Hypertension Father    Heart failure Father    Asthma Sister    Hypertension Sister    Miscarriages / Stillbirths Sister    Breast cancer Paternal Aunt 44   Osteoarthritis Maternal Grandmother    Asthma Maternal Grandmother    Cancer Maternal Grandmother    COPD Maternal Grandmother    Depression Maternal Grandmother    Early death Maternal Grandmother    Osteoarthritis Maternal Grandfather    Asthma Maternal Grandfather    COPD Maternal Grandfather    Hypertension Maternal Grandfather    Hyperlipidemia Maternal Grandfather    Cancer Paternal Grandmother    Diabetes Paternal Grandmother    Diabetes Paternal Grandfather    Hyperlipidemia Paternal Grandfather    Heart failure Paternal Grandfather    Kidney disease Paternal Grandfather    Alcohol abuse Brother    Hypertension Brother    Hyperlipidemia Other    Heart failure Other    Kidney disease Other     Allergies  Allergen Reactions   Clarithromycin Nausea And Vomiting    Current Outpatient Medications on File Prior to Visit  Medication Sig Dispense Refill   albuterol  (VENTOLIN  HFA) 108 (90 Base) MCG/ACT inhaler Inhale 1-2 puffs into the lungs every 6 (six) hours as needed. 8 g 0   butalbital -aspirin -caffeine  (FIORINAL ) 50-325-40 MG capsule Take 1 capsule by mouth every 6 (six) hours as needed for headache. For headache/migraines 20 capsule 0   cetirizine  (ZYRTEC ) 10 MG tablet Take 1 tablet (10 mg total) by mouth daily. 30 tablet 11   Continuous  Glucose Sensor (FREESTYLE LIBRE 3 SENSOR) MISC Place 1 sensor on the skin every 14 days. Use to check glucose continuously 6 each 1   fluticasone  (FLONASE ) 50 MCG/ACT nasal spray Place 2 sprays into both nostrils daily. 16 g 6   fluticasone -salmeterol (WIXELA INHUB) 100-50 MCG/ACT AEPB Inhale 1 puff into the lungs 2 (two) times daily. 60 each 3   ipratropium (ATROVENT ) 0.03 % nasal spray Place 2 sprays into both nostrils every 12 (twelve)  hours. (Patient taking differently: Place 2 sprays into both nostrils every 12 (twelve) hours. As needed) 30 mL 0   letrozole (FEMARA) 2.5 MG tablet Take 2.5 mg by mouth daily.     levothyroxine (SYNTHROID) 25 MCG tablet Take 25 mcg by mouth daily.     Multiple Vitamins-Minerals (MULTIVITAMIN WITH MINERALS) tablet Take 1 tablet by mouth daily.     norethindrone (AYGESTIN) 5 MG tablet Take 2.5 mg by mouth 2 (two) times daily.     sertraline  (ZOLOFT ) 100 MG tablet TAKE 1 TABLET (100 MG TOTAL) BY MOUTH DAILY. FOR ANXIETY 90 tablet 0   tirzepatide  (MOUNJARO ) 15 MG/0.5ML Pen INJECT 15 MG INTO THE SKIN ONCE A WEEK. FOR DIABETES. 6 mL 0   tiZANidine  (ZANAFLEX ) 4 MG tablet Take 4 mg by mouth 3 (three) times daily as needed.     hydrOXYzine  (ATARAX ) 10 MG tablet TAKE 1-2 TABLETS (10-20 MG TOTAL) BY MOUTH 2 (TWO) TIMES DAILY AS NEEDED FOR ANXIETY. 60 tablet 0   No current facility-administered medications on file prior to visit.    BP 136/88   Pulse 94   Temp 98.3 F (36.8 C) (Oral)   Ht 5' 5 (1.651 m)   Wt 218 lb 4 oz (99 kg)   LMP 08/20/2024   SpO2 99%   BMI 36.32 kg/m  Objective:   Physical Exam Constitutional:      General: She is not in acute distress. HENT:     Head:     Comments: No ulcers or blistering noted to the tongue, gums, cheeks, or other parts of the oral cavity. Skin:    General: Skin is warm and dry.     Comments: 0.5 cm ulcerated circular area to upper right/mid lip, mostly on the inner part.      Physical Exam         Assessment & Plan:  Lip lesion Assessment & Plan: Different appearing compared to last Year's lesion appears more like an ulcer which could be secondary to stress from biting her lip.  Will treat with triamcinolone  0.1% paste twice daily. If no improvement or if she continues to experience these symptoms then consider dermatology referral for evaluation of bullous pemphigoid or other cause.  She will update.  Orders: -     Triamcinolone  Acetonide; Use as directed 1 Application in the mouth or throat 2 (two) times daily.  Dispense: 5 g; Refill: 0    Assessment and Plan Assessment & Plan         Comer MARLA Gaskins, NP    History of Present Illness

## 2024-10-07 NOTE — Telephone Encounter (Signed)
 Duplicate request (see 09/27/24 refill note).   Pt has OV today at 3:00.

## 2024-10-07 NOTE — Assessment & Plan Note (Signed)
 Different appearing compared to last Year's lesion appears more like an ulcer which could be secondary to stress from biting her lip.  Will treat with triamcinolone  0.1% paste twice daily. If no improvement or if she continues to experience these symptoms then consider dermatology referral for evaluation of bullous pemphigoid or other cause.  She will update.

## 2024-11-02 ENCOUNTER — Ambulatory Visit: Admitting: Cardiology

## 2024-11-03 ENCOUNTER — Telehealth: Payer: Self-pay

## 2024-11-03 ENCOUNTER — Other Ambulatory Visit (HOSPITAL_COMMUNITY): Payer: Self-pay

## 2024-11-03 NOTE — Telephone Encounter (Signed)
 Pharmacy Patient Advocate Encounter   Received notification from Onbase that prior authorization for [facility] 3 plus sensor is required/requested.   Insurance verification completed.   The patient is insured through [employer].   Per test claim: The current 30 day co-pay is, $39.99.  No PA needed at this time. This test claim was processed through [facility]- copay amounts may vary at other pharmacies due to pharmacy/plan contracts, or as the patient moves through the different stages of their insurance plan.    Patient current approved PA expires 11/27/24. Will resubmit for renewal closer to that time.

## 2024-11-09 ENCOUNTER — Telehealth: Admitting: Physician Assistant

## 2024-11-09 DIAGNOSIS — B3731 Acute candidiasis of vulva and vagina: Secondary | ICD-10-CM

## 2024-11-09 MED ORDER — FLUCONAZOLE 150 MG PO TABS: 150.0000 mg | ORAL_TABLET | ORAL | 0 refills | Status: AC | PRN

## 2024-11-09 NOTE — Progress Notes (Signed)

## 2024-11-28 ENCOUNTER — Other Ambulatory Visit: Payer: Self-pay | Admitting: Primary Care

## 2024-11-28 DIAGNOSIS — F411 Generalized anxiety disorder: Secondary | ICD-10-CM

## 2024-12-02 ENCOUNTER — Telehealth: Admitting: Physician Assistant

## 2024-12-02 DIAGNOSIS — A084 Viral intestinal infection, unspecified: Secondary | ICD-10-CM

## 2024-12-02 MED ORDER — ONDANSETRON 4 MG PO TBDP: 4.0000 mg | ORAL_TABLET | Freq: Three times a day (TID) | ORAL | 0 refills | Status: AC | PRN

## 2024-12-02 NOTE — Progress Notes (Signed)

## 2024-12-09 ENCOUNTER — Telehealth: Admitting: Physician Assistant

## 2024-12-09 DIAGNOSIS — B9689 Other specified bacterial agents as the cause of diseases classified elsewhere: Secondary | ICD-10-CM

## 2024-12-09 DIAGNOSIS — J208 Acute bronchitis due to other specified organisms: Secondary | ICD-10-CM | POA: Diagnosis not present

## 2024-12-09 MED ORDER — AMOXICILLIN-POT CLAVULANATE 875-125 MG PO TABS: 1.0000 | ORAL_TABLET | Freq: Two times a day (BID) | ORAL | 0 refills | Status: AC

## 2024-12-09 MED ORDER — BENZONATATE 100 MG PO CAPS: 100.0000 mg | ORAL_CAPSULE | Freq: Three times a day (TID) | ORAL | 0 refills | Status: AC | PRN

## 2024-12-09 NOTE — Progress Notes (Signed)
 We are sorry that you are not feeling well.  Here is how we plan to help!  Based on your presentation I believe you most likely haveA cough due to bacteria.  When patients have a fever and a productive cough with a change in color or increased sputum production, we are concerned about bacterial bronchitis.  If left untreated it can progress to pneumonia.  If your symptoms do not improve with your treatment plan it is important that you contact your provider.   I have prescribed Augmentin  875-125mg  Take 1 tablet twice daily for 7 days.   In addition you may use A non-prescription cough medication called Mucinex DM: take 2 tablets every 12 hours. and A prescription cough medication called Tessalon  Perles 100mg . You may take 1-2 capsules every 8 hours as needed for your cough.  From your responses in the eVisit questionnaire you describe inflammation in the upper respiratory tract which is causing a significant cough.  This is commonly called Bronchitis and has four common causes:   Allergies Viral Infections Acid Reflux Bacterial Infection Allergies, viruses and acid reflux are treated by controlling symptoms or eliminating the cause. An example might be a cough caused by taking certain blood pressure medications. You stop the cough by changing the medication. Another example might be a cough caused by acid reflux. Controlling the reflux helps control the cough.  USE OF BRONCHODILATOR (RESCUE) INHALERS: There is a risk from using your bronchodilator too frequently.  The risk is that over-reliance on a medication which only relaxes the muscles surrounding the breathing tubes can reduce the effectiveness of medications prescribed to reduce swelling and congestion of the tubes themselves.  Although you feel brief relief from the bronchodilator inhaler, your asthma may actually be worsening with the tubes becoming more swollen and filled with mucus.  This can delay other crucial treatments, such as oral  steroid medications. If you need to use a bronchodilator inhaler daily, several times per day, you should discuss this with your provider.  There are probably better treatments that could be used to keep your asthma under control.     HOME CARE Only take medications as instructed by your medical team. Complete the entire course of an antibiotic. Drink plenty of fluids and get plenty of rest. Avoid close contacts especially the very young and the elderly Cover your mouth if you cough or cough into your sleeve. Always remember to wash your hands A steam or ultrasonic humidifier can help congestion.   GET HELP RIGHT AWAY IF: You develop worsening fever. You become short of breath You cough up blood. Your symptoms persist after you have completed your treatment plan MAKE SURE YOU  Understand these instructions. Will watch your condition. Will get help right away if you are not doing well or get worse.  Your e-visit answers were reviewed by a board certified advanced clinical practitioner to complete your personal care plan.  Depending on the condition, your plan could have included both over the counter or prescription medications. If there is a problem please reply  once you have received a response from your provider. Your safety is important to us .  If you have drug allergies check your prescription carefully.    You can use MyChart to ask questions about todays visit, request a non-urgent call back, or ask for a work or school excuse for 24 hours related to this e-Visit. If it has been greater than 24 hours you will need to follow up with your  provider, or enter a new e-Visit to address those concerns. You will get an e-mail in the next two days asking about your experience.  I hope that your e-visit has been valuable and will speed your recovery. Thank you for using e-visits.   I have spent 5 minutes in review of e-visit questionnaire, review and updating patient chart, medical  decision making and response to patient.   Delon CHRISTELLA Dickinson, PA-C

## 2024-12-19 ENCOUNTER — Other Ambulatory Visit: Payer: Self-pay | Admitting: Primary Care

## 2024-12-19 DIAGNOSIS — F411 Generalized anxiety disorder: Secondary | ICD-10-CM

## 2024-12-21 ENCOUNTER — Other Ambulatory Visit: Payer: Self-pay | Admitting: Primary Care

## 2024-12-21 DIAGNOSIS — F411 Generalized anxiety disorder: Secondary | ICD-10-CM

## 2024-12-29 ENCOUNTER — Other Ambulatory Visit: Payer: Self-pay | Admitting: Primary Care

## 2024-12-29 DIAGNOSIS — E1165 Type 2 diabetes mellitus with hyperglycemia: Secondary | ICD-10-CM

## 2025-01-06 ENCOUNTER — Other Ambulatory Visit: Payer: Self-pay | Admitting: Primary Care

## 2025-01-06 DIAGNOSIS — E1165 Type 2 diabetes mellitus with hyperglycemia: Secondary | ICD-10-CM

## 2025-01-10 ENCOUNTER — Ambulatory Visit: Payer: Self-pay | Admitting: Primary Care
